# Patient Record
Sex: Female | Born: 1995 | Race: Black or African American | Hispanic: No | Marital: Single | Smoking: Never smoker
Health system: Southern US, Community
[De-identification: ages and names within clinical notes are randomized; demographics above are authoritative.]

## PROBLEM LIST (undated history)

## (undated) ENCOUNTER — Inpatient Hospital Stay (HOSPITAL_COMMUNITY): Payer: Self-pay

## (undated) DIAGNOSIS — N390 Urinary tract infection, site not specified: Secondary | ICD-10-CM

## (undated) DIAGNOSIS — B009 Herpesviral infection, unspecified: Secondary | ICD-10-CM

## (undated) DIAGNOSIS — F419 Anxiety disorder, unspecified: Secondary | ICD-10-CM

## (undated) DIAGNOSIS — Z789 Other specified health status: Secondary | ICD-10-CM

## (undated) DIAGNOSIS — L0291 Cutaneous abscess, unspecified: Secondary | ICD-10-CM

## (undated) HISTORY — PX: WISDOM TOOTH EXTRACTION: SHX21

## (undated) HISTORY — PX: HERNIA REPAIR: SHX51

---
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
## 2013-08-22 ENCOUNTER — Inpatient Hospital Stay (HOSPITAL_COMMUNITY)
Admission: AD | Admit: 2013-08-22 | Discharge: 2013-08-22 | Disposition: A | Discharge status: Home / Self Care | Payer: Medicaid Other | Source: Ambulatory Visit | Attending: Obstetrics and Gynecology | Admitting: Obstetrics and Gynecology

## 2013-08-22 ENCOUNTER — Encounter (HOSPITAL_COMMUNITY): Payer: Self-pay | Admitting: Family

## 2013-08-22 ENCOUNTER — Inpatient Hospital Stay (HOSPITAL_COMMUNITY): Payer: Medicaid Other

## 2013-08-22 DIAGNOSIS — O99891 Other specified diseases and conditions complicating pregnancy: Secondary | ICD-10-CM | POA: Diagnosis not present

## 2013-08-22 DIAGNOSIS — R109 Unspecified abdominal pain: Secondary | ICD-10-CM | POA: Insufficient documentation

## 2013-08-22 DIAGNOSIS — O9989 Other specified diseases and conditions complicating pregnancy, childbirth and the puerperium: Secondary | ICD-10-CM

## 2013-08-22 DIAGNOSIS — O26899 Other specified pregnancy related conditions, unspecified trimester: Secondary | ICD-10-CM

## 2013-08-22 HISTORY — DX: Herpesviral infection, unspecified: B00.9

## 2013-08-22 LAB — CBC
HCT: 33.6 % — ABNORMAL LOW (ref 36.0–46.0)
Hemoglobin: 10.9 g/dL — ABNORMAL LOW (ref 12.0–15.0)
MCH: 27.8 pg (ref 26.0–34.0)
MCHC: 32.4 g/dL (ref 30.0–36.0)
MCV: 85.7 fL (ref 78.0–100.0)
Platelets: 298 10*3/uL (ref 150–400)
RBC: 3.92 MIL/uL (ref 3.87–5.11)
RDW: 13.8 % (ref 11.5–15.5)
WBC: 5.5 10*3/uL (ref 4.0–10.5)

## 2013-08-22 LAB — POCT PREGNANCY, URINE: Preg Test, Ur: POSITIVE — AB

## 2013-08-22 LAB — WET PREP, GENITAL: TRICH WET PREP: NONE SEEN

## 2013-08-22 LAB — URINALYSIS, ROUTINE W REFLEX MICROSCOPIC
BILIRUBIN URINE: NEGATIVE
GLUCOSE, UA: NEGATIVE mg/dL
Hgb urine dipstick: NEGATIVE
KETONES UR: NEGATIVE mg/dL
Leukocytes, UA: NEGATIVE
Nitrite: NEGATIVE
PH: 6.5 (ref 5.0–8.0)
Protein, ur: NEGATIVE mg/dL
SPECIFIC GRAVITY, URINE: 1.02 (ref 1.005–1.030)
Urobilinogen, UA: 1 mg/dL (ref 0.0–1.0)

## 2013-08-22 LAB — HCG, QUANTITATIVE, PREGNANCY: hCG, Beta Chain, Quant, S: 3221 m[IU]/mL — ABNORMAL HIGH (ref <5)

## 2013-08-22 LAB — ABO/RH: ABO/RH(D): A POS

## 2013-08-22 MED ORDER — PRENATAL VITAMINS 28-0.8 MG PO TABS: 1.0000 | ORAL_TABLET | Freq: Every day | ORAL | Status: DC

## 2013-08-22 NOTE — MAU Note (Signed)
18 yo with +HPT on 7/3, presents with c/o lower back pain since yesterday and intermittent abdominal cramping for 2 weeks. No VB.

## 2013-08-22 NOTE — MAU Provider Note (Signed)
Attestation of Attending Supervision of Advanced Practitioner (CNM/NP): Evaluation and management procedures were performed by the Advanced Practitioner under my supervision and collaboration.  I have reviewed the Advanced Practitioner's note and chart, and I agree with the management and plan.  Maral Lampe 08/22/2013 8:24 PM

## 2013-08-22 NOTE — Discharge Instructions (Signed)

## 2013-08-22 NOTE — MAU Provider Note (Signed)
Chief Complaint: Possible Pregnancy and Abdominal Pain   None    SUBJECTIVE HPI: Amy Downs is a 18 y.o. G0P0 at Unknown by LMP who presents to maternity admissions reporting abdominal cramping, back pain, and positive HPT.  She denies vaginal bleeding, vaginal itching/burning, urinary symptoms, h/a, dizziness, n/v, or fever/chills.     Past Medical History  Diagnosis Date  . Abscess   . Medical history non-contributory   . HSV-2 infection    Past Surgical History  Procedure Laterality Date  . Wisdom tooth extraction     History   Social History  . Marital Status: Single    Spouse Name: N/A    Number of Children: N/A  . Years of Education: N/A   Occupational History  . Not on file.   Social History Main Topics  . Smoking status: Never Smoker   . Smokeless tobacco: Not on file  . Alcohol Use: No  . Drug Use: No  . Sexual Activity: Yes    Birth Control/ Protection: Condom   Other Topics Concern  . Not on file   Social History Narrative  . No narrative on file   No current facility-administered medications on file prior to encounter.   Current Outpatient Prescriptions on File Prior to Encounter  Medication Sig Dispense Refill  . ibuprofen (ADVIL,MOTRIN) 200 MG tablet Take 400 mg by mouth every 8 (eight) hours as needed for pain or headache.       Marland Kitchen. EPINEPHrine (EPIPEN) 0.3 mg/0.3 mL SOAJ Inject 0.3 mg into the muscle as needed (for allergic reaction).       No Known Allergies  ROS: Pertinent items in HPI  OBJECTIVE Blood pressure 116/73, pulse 85, temperature 98.3 F (36.8 C), temperature source Oral, resp. rate 15, last menstrual period 07/19/2013. GENERAL: Well-developed, well-nourished female in no acute distress.  HEENT: Normocephalic HEART: normal rate RESP: normal effort ABDOMEN: Soft, non-tender EXTREMITIES: Nontender, no edema NEURO: Alert and oriented Pelvic exam: Cervix pink, visually closed, without lesion, scant white creamy discharge,  vaginal walls and external genitalia normal Bimanual exam: Cervix 0/long/high, firm, anterior, neg CMT, uterus nontender, nonenlarged, adnexa without tenderness, enlargement, or mass  LAB RESULTS Results for orders placed during the hospital encounter of 08/22/13 (from the past 24 hour(s))  URINALYSIS, ROUTINE W REFLEX MICROSCOPIC     Status: None   Collection Time    08/22/13 11:45 AM      Result Value Ref Range   Color, Urine YELLOW  YELLOW   APPearance CLEAR  CLEAR   Specific Gravity, Urine 1.020  1.005 - 1.030   pH 6.5  5.0 - 8.0   Glucose, UA NEGATIVE  NEGATIVE mg/dL   Hgb urine dipstick NEGATIVE  NEGATIVE   Bilirubin Urine NEGATIVE  NEGATIVE   Ketones, ur NEGATIVE  NEGATIVE mg/dL   Protein, ur NEGATIVE  NEGATIVE mg/dL   Urobilinogen, UA 1.0  0.0 - 1.0 mg/dL   Nitrite NEGATIVE  NEGATIVE   Leukocytes, UA NEGATIVE  NEGATIVE  POCT PREGNANCY, URINE     Status: Abnormal   Collection Time    08/22/13 12:04 PM      Result Value Ref Range   Preg Test, Ur POSITIVE (*) NEGATIVE  CBC     Status: Abnormal   Collection Time    08/22/13 12:25 PM      Result Value Ref Range   WBC 5.5  4.0 - 10.5 K/uL   RBC 3.92  3.87 - 5.11 MIL/uL   Hemoglobin 10.9 (*) 12.0 -  15.0 g/dL   HCT 16.1 (*) 09.6 - 04.5 %   MCV 85.7  78.0 - 100.0 fL   MCH 27.8  26.0 - 34.0 pg   MCHC 32.4  30.0 - 36.0 g/dL   RDW 40.9  81.1 - 91.4 %   Platelets 298  150 - 400 K/uL  ABO/RH     Status: None   Collection Time    08/22/13 12:25 PM      Result Value Ref Range   ABO/RH(D) A POS    HCG, QUANTITATIVE, PREGNANCY     Status: Abnormal   Collection Time    08/22/13 12:25 PM      Result Value Ref Range   hCG, Beta Chain, Quant, S 3221 (*) <5 mIU/mL    IMAGING US Ob Comp Less 14 Wks  08/22/2013   CLINICAL DATA:  18 year old G1, LMP 07/19/2013 (4 weeks 6 days), quantitative beta HCG 3,221, presenting with low back pain since yesterday and intermittent cramping over the past 2 weeks.  EXAM: OBSTETRIC <14 WK [place] AND  TRANSVAGINAL OB US  TECHNIQUE: Both transabdominal and transvaginal ultrasound examinations were performed for complete evaluation of the gestation as well as the maternal uterus, adnexal regions, and pelvic cul-de-sac. Transvaginal technique was performed to assess early pregnancy.  COMPARISON:  None.  FINDINGS: Intrauterine gestational sac: 2 intrauterine gestational sacs, the more inferior of which is designated A and the more superior of which is designated B. Both have normal oval shapes and normal decidual reactions.  Yolk sac:  Not yet identified in either sac.  Embryo:  Not yet identified in either sac.  Cardiac Activity: Not applicable.  MSD (sac A, inferior):  4.3 mm   5 w   0 d  MSD (sac B, superior): 3.5 mm   4 w   6 d  [place] EDC: 04/25/2014  Maternal uterus/adnexae: No evidence of subchorionic hemorrhage. Normal-appearing closed cervix. Right ovary normal in size and appearance measuring approximately 2.6 x 2.0 x 2.1 cm. Left ovary normal in size and appearance measuring approximately 2.2 x 1.6 x 1.9 cm, containing a small corpus luteum cyst. Small amount of free fluid in the cul-de-sac. No adnexal masses.  IMPRESSION: 1. Two separate intrauterine gestational sacs, each of which appear to have normal decidual reactions. No yolk sac, fetal pole, or cardiac activity yet visualized. Estimated gestational age by mean sac diameter 4 weeks 6 days, corresponding exactly with the LMP. Recommend follow-up quantitative B-HCG levels and follow-up US in 14 days to confirm and assess viability. This recommendation follows SRU consensus guidelines: Diagnostic Criteria for Nonviable Pregnancy Early in the First Trimester. Malva Limes Med 2013; 782:9562-13. 2. Small amount of likely physiologic free fluid in the cul-de-sac. 3. No adnexal masses.   Electronically Signed   By: Hulan Saas M.D.   On: 08/22/2013 13:57   US Ob Transvaginal  08/22/2013   CLINICAL DATA:  18 year old G1, LMP 07/19/2013 (4 weeks 6 days),  quantitative beta HCG 3,221, presenting with low back pain since yesterday and intermittent cramping over the past 2 weeks.  EXAM: OBSTETRIC <14 WK [place] AND TRANSVAGINAL OB US  TECHNIQUE: Both transabdominal and transvaginal ultrasound examinations were performed for complete evaluation of the gestation as well as the maternal uterus, adnexal regions, and pelvic cul-de-sac. Transvaginal technique was performed to assess early pregnancy.  COMPARISON:  None.  FINDINGS: Intrauterine gestational sac: 2 intrauterine gestational sacs, the more inferior of which is designated A and the more superior of which  is designated B. Both have normal oval shapes and normal decidual reactions.  Yolk sac:  Not yet identified in either sac.  Embryo:  Not yet identified in either sac.  Cardiac Activity: Not applicable.  MSD (sac A, inferior):  4.3 mm   5 w   0 d  MSD (sac B, superior): 3.5 mm   4 w   6 d  US EDC: 04/25/2014  Maternal uterus/adnexae: No evidence of subchorionic hemorrhage. Normal-appearing closed cervix. Right ovary normal in size and appearance measuring approximately 2.6 x 2.0 x 2.1 cm. Left ovary normal in size and appearance measuring approximately 2.2 x 1.6 x 1.9 cm, containing a small corpus luteum cyst. Small amount of free fluid in the cul-de-sac. No adnexal masses.  IMPRESSION: 1. Two separate intrauterine gestational sacs, each of which appear to have normal decidual reactions. No yolk sac, fetal pole, or cardiac activity yet visualized. Estimated gestational age by mean sac diameter 4 weeks 6 days, corresponding exactly with the LMP. Recommend follow-up quantitative B-HCG levels and follow-up US in 14 days to confirm and assess viability. This recommendation follows SRU consensus guidelines: Diagnostic Criteria for Nonviable Pregnancy Early in the First Trimester. Malva Limes Engl J Med 2013; 914:7829-56; 369:1443-51. 2. Small amount of likely physiologic free fluid in the cul-de-sac. 3. No adnexal masses.   Electronically Signed    By: Hulan Saashomas  Lawrence M.D.   On: 08/22/2013 13:57    ASSESSMENT 1. Abdominal pain affecting pregnancy, antepartum     PLAN Discharge home with ectopic precautions Return to MAU in 48 hours for repeat quant hcg Return sooner as needed for emergencies    Medication List    STOP taking these medications       ibuprofen 200 MG tablet  Commonly known as:  ADVIL,MOTRIN      TAKE these medications       EPIPEN 0.3 mg/0.3 mL Soaj injection  Generic drug:  EPINEPHrine  Inject 0.3 mg into the muscle as needed (for allergic reaction).     Prenatal Vitamins 28-0.8 MG Tabs  Take 1 tablet by mouth daily.     valACYclovir 1000 MG tablet  Commonly known as:  VALTREX  Take 500 mg by mouth 2 (two) times daily as needed (outbreaks).         Sharen CounterLisa Leftwich-Kirby Certified Nurse-Midwife 08/22/2013  2:25 PM

## 2013-08-23 LAB — GC/CHLAMYDIA PROBE AMP
CT PROBE, AMP APTIMA: NEGATIVE
GC Probe RNA: NEGATIVE

## 2013-08-24 ENCOUNTER — Inpatient Hospital Stay (HOSPITAL_COMMUNITY)
Admission: AD | Admit: 2013-08-24 | Discharge: 2013-08-24 | Disposition: A | Discharge status: Home / Self Care | Payer: Medicaid Other | Source: Ambulatory Visit | Attending: Obstetrics & Gynecology | Admitting: Obstetrics & Gynecology

## 2013-08-24 DIAGNOSIS — O99891 Other specified diseases and conditions complicating pregnancy: Secondary | ICD-10-CM | POA: Insufficient documentation

## 2013-08-24 DIAGNOSIS — R109 Unspecified abdominal pain: Secondary | ICD-10-CM

## 2013-08-24 DIAGNOSIS — Z09 Encounter for follow-up examination after completed treatment for conditions other than malignant neoplasm: Secondary | ICD-10-CM | POA: Diagnosis not present

## 2013-08-24 DIAGNOSIS — O9989 Other specified diseases and conditions complicating pregnancy, childbirth and the puerperium: Secondary | ICD-10-CM

## 2013-08-24 DIAGNOSIS — O26899 Other specified pregnancy related conditions, unspecified trimester: Secondary | ICD-10-CM

## 2013-08-24 LAB — HCG, QUANTITATIVE, PREGNANCY: hCG, Beta Chain, Quant, S: 8495 m[IU]/mL — ABNORMAL HIGH (ref <5)

## 2013-08-24 NOTE — MAU Note (Signed)
Feeling ok. occ cramping. No bleeding.

## 2013-08-24 NOTE — Discharge Instructions (Signed)

## 2013-08-24 NOTE — MAU Provider Note (Signed)
  History     CSN: [id number]  Arrival date and time: 08/24/13 1441   None     Chief Complaint  Patient presents with  . Follow-up   HPI  Pt is a 18 yo G1P0 at 5614w1d wks IUP presenting to MAU for repeat BHCG.  Seen in MAU on 08/22/13 for abdominal pain and positive pregnancy test.  Pt blood type is Apos and BHCG on that visit was 3221.  Ultrasound showed the following results:  IMPRESSION: 1. Two separate intrauterine gestational sacs, each of which appear to have normal decidual reactions. No yolk sac, fetal pole, or cardiac activity yet visualized. Estimated gestational age by mean sac diameter 4 weeks 6 days, corresponding exactly with the LMP.   Pt denies vaginal bleeding or pain.    Past Medical History  Diagnosis Date  . Abscess   . Medical history non-contributory   . HSV-2 infection     Past Surgical History  Procedure Laterality Date  . Wisdom tooth extraction      No family history on file.  History  Substance Use Topics  . Smoking status: Never Smoker   . Smokeless tobacco: Not on file  . Alcohol Use: No    Allergies: No Known Allergies  Prescriptions prior to admission  Medication Sig Dispense Refill  . EPINEPHrine (EPIPEN) 0.3 mg/0.3 mL SOAJ Inject 0.3 mg into the muscle as needed (for allergic reaction).      . Prenatal Vit-Fe Fumarate-FA (PRENATAL VITAMINS) 28-0.8 MG TABS Take 1 tablet by mouth daily.  30 tablet  5  . valACYclovir (VALTREX) 1000 MG tablet Take 500 mg by mouth 2 (two) times daily as needed (outbreaks).        Review of Systems  Gastrointestinal: Negative for abdominal pain.  Genitourinary:       Negative vaginal bleeding   Pertinent info found in HPI Physical Exam   Blood pressure 123/62, pulse 90, temperature 98.2 F (36.8 C), temperature source Oral, resp. rate 16, last menstrual period 07/19/2013.  Physical Exam  Constitutional: She is oriented to person, place, and time. She appears well-developed and well-nourished. No  distress.  HENT:  Head: Normocephalic.  Neck: Normal range of motion. Neck supple.  GI: Soft. There is no tenderness.  Neurological: She is alert and oriented to person, place, and time. She has normal reflexes.  Skin: Skin is warm and dry.    MAU Course  Procedures  Results for orders placed during the hospital encounter of 08/24/13 (from the past 24 hour(s))  HCG, QUANTITATIVE, PREGNANCY     Status: Abnormal   Collection Time    08/24/13  2:49 PM      Result Value Ref Range   hCG, Beta Chain, Quant, S 8495 (*) <5 mIU/mL    Assessment and Plan  18 yo G1P0 at 3014w1d wks IUP - Appropriate BHCG Rise  Plan: Discharge to home Repeat ultrasound in 10 days for viability - scheduled for July 17th at 2:45PM Reviewed bleeding/ectopic precautions  [email or web address],WALIDAH 08/24/2013, 3:40 PM

## 2013-08-26 NOTE — MAU Provider Note (Signed)
Attestation of Attending Supervision of Advanced Practitioner (CNM/NP): Evaluation and management procedures were performed by the Advanced Practitioner under my supervision and collaboration. I have reviewed the Advanced Practitioner's note and chart, and I agree with the management and plan.  Keyasia Jolliff H. 9:06 AM   

## 2013-08-29 ENCOUNTER — Inpatient Hospital Stay (HOSPITAL_COMMUNITY)
Admission: AD | Admit: 2013-08-29 | Discharge: 2013-08-29 | Discharge status: Against Medical Advice | Payer: Medicaid Other | Source: Ambulatory Visit | Attending: Obstetrics & Gynecology | Admitting: Obstetrics & Gynecology

## 2013-08-29 DIAGNOSIS — O210 Mild hyperemesis gravidarum: Secondary | ICD-10-CM | POA: Diagnosis not present

## 2013-08-29 DIAGNOSIS — R109 Unspecified abdominal pain: Secondary | ICD-10-CM | POA: Diagnosis not present

## 2013-08-29 LAB — URINE MICROSCOPIC-ADD ON

## 2013-08-29 LAB — URINALYSIS, ROUTINE W REFLEX MICROSCOPIC
Bilirubin Urine: NEGATIVE
GLUCOSE, UA: NEGATIVE mg/dL
HGB URINE DIPSTICK: NEGATIVE
KETONES UR: 15 mg/dL — AB
Nitrite: NEGATIVE
PROTEIN: NEGATIVE mg/dL
Specific Gravity, Urine: 1.02 (ref 1.005–1.030)
Urobilinogen, UA: 1 mg/dL (ref 0.0–1.0)
pH: 6 (ref 5.0–8.0)

## 2013-08-29 NOTE — MAU Note (Signed)
Prenatal pills make her throw up. Threw up 5 times today, no food- just bile.  Pain in upper stomach, started yesterday- comes and goes, none now.  Pain below ribs on left side, also comes and goes- none now; worse when lays down.

## 2013-09-03 ENCOUNTER — Ambulatory Visit (HOSPITAL_COMMUNITY): Payer: Medicaid Other

## 2013-12-20 ENCOUNTER — Encounter (HOSPITAL_COMMUNITY): Payer: Self-pay | Admitting: Family

## 2014-01-24 ENCOUNTER — Encounter (HOSPITAL_COMMUNITY): Payer: Self-pay | Admitting: *Deleted

## 2014-01-24 ENCOUNTER — Inpatient Hospital Stay (HOSPITAL_COMMUNITY)
Admission: AD | Admit: 2014-01-24 | Discharge: 2014-01-24 | Disposition: A | Discharge status: Home / Self Care | Payer: Medicaid Other | Source: Ambulatory Visit | Attending: Family Medicine | Admitting: Family Medicine

## 2014-01-24 ENCOUNTER — Inpatient Hospital Stay (HOSPITAL_COMMUNITY): Payer: Medicaid Other

## 2014-01-24 DIAGNOSIS — Z3A27 27 weeks gestation of pregnancy: Secondary | ICD-10-CM | POA: Diagnosis not present

## 2014-01-24 DIAGNOSIS — O99012 Anemia complicating pregnancy, second trimester: Secondary | ICD-10-CM | POA: Diagnosis not present

## 2014-01-24 DIAGNOSIS — O368132 Decreased fetal movements, third trimester, fetus 2: Secondary | ICD-10-CM

## 2014-01-24 DIAGNOSIS — D509 Iron deficiency anemia, unspecified: Secondary | ICD-10-CM | POA: Diagnosis not present

## 2014-01-24 DIAGNOSIS — O368120 Decreased fetal movements, second trimester, not applicable or unspecified: Secondary | ICD-10-CM | POA: Diagnosis not present

## 2014-01-24 DIAGNOSIS — R109 Unspecified abdominal pain: Secondary | ICD-10-CM | POA: Diagnosis present

## 2014-01-24 DIAGNOSIS — O30002 Twin pregnancy, unspecified number of placenta and unspecified number of amniotic sacs, second trimester: Secondary | ICD-10-CM | POA: Insufficient documentation

## 2014-01-24 DIAGNOSIS — O368190 Decreased fetal movements, unspecified trimester, not applicable or unspecified: Secondary | ICD-10-CM | POA: Insufficient documentation

## 2014-01-24 LAB — URINALYSIS, ROUTINE W REFLEX MICROSCOPIC
Bilirubin Urine: NEGATIVE
Glucose, UA: NEGATIVE mg/dL
Hgb urine dipstick: NEGATIVE
KETONES UR: 15 mg/dL — AB
Leukocytes, UA: NEGATIVE
NITRITE: NEGATIVE
PH: 5.5 (ref 5.0–8.0)
Protein, ur: NEGATIVE mg/dL
Urobilinogen, UA: 1 mg/dL (ref 0.0–1.0)

## 2014-01-24 NOTE — Progress Notes (Signed)
MD informed pt back from U/S, further EFM not needed @ this time.

## 2014-01-24 NOTE — MAU Provider Note (Signed)
History     CSN: [id number]  Arrival date and time: 01/24/14 1351    Chief Complaint  Patient presents with  . Abdominal Pain   HPI  Amy Downs is a 18 y.o. G1P0 at 7851w0d currently pregnancy with twins who presents to the MAU for evaluation of decreased fetal movement starting earlier today. Yesterday was feeling normal fetal movement. Today has felt the twins move, but not like normal. Denies uterine contractions, loss of fluid, vaginal bleeding. Otherwise feels well.   Pregnancy c/b: - Obt[place] care from [facility] [place], but [place] in [place] Twins - Iron Deficiency Anemia on ferrous sulfate - HSV   Past Medical History  Diagnosis Date  . Abscess   . Medical history non-contributory   . HSV-2 infection     Past Surgical History  Procedure Laterality Date  . Wisdom tooth extraction      History reviewed. No pertinent family history.  History  Substance Use Topics  . Smoking status: Never Smoker   . Smokeless tobacco: Not on file  . Alcohol Use: No    Allergies: No Known Allergies  Prescriptions prior to admission  Medication Sig Dispense Refill Last Dose  . Ferrous Sulfate (SLOW FE PO) Take 1 tablet by mouth daily.   01/24/2014 at Unknown time  . Prenatal Vit-Fe Fumarate-FA (PRENATAL VITAMINS) 28-0.8 MG TABS Take 1 tablet by mouth daily. 30 tablet 5 01/23/2014 at Unknown time  . valACYclovir (VALTREX) 1000 MG tablet Take 1,000 mg by mouth daily.    01/23/2014 at Unknown time  . EPINEPHrine (EPIPEN) 0.3 mg/0.3 mL SOAJ Inject 0.3 mg into the muscle as needed (for allergic reaction).   Emergency    Review of Systems  Constitutional: Negative.  Negative for fever, chills and malaise/fatigue.  HENT: Negative.  Negative for congestion and sore throat.   Eyes: Negative.  Negative for double vision and photophobia.  Respiratory: Negative.  Negative for cough, shortness of breath and wheezing.   Cardiovascular: Negative.  Negative for chest pain and leg  swelling.  Gastrointestinal: Negative.  Negative for nausea, vomiting, abdominal pain, diarrhea and constipation.  Genitourinary: Negative.  Negative for dysuria, urgency, frequency and hematuria.  Musculoskeletal: Negative.  Negative for myalgias.  Skin: Negative.   Neurological: Negative.  Negative for weakness and headaches.  Psychiatric/Behavioral: Negative.   All other systems reviewed and are negative.  Physical Exam   Blood pressure 118/71, pulse 107, temperature 98.1 F (36.7 C), temperature source Oral, resp. rate 20, last menstrual period 07/19/2013.  Physical Exam  Nursing note and vitals reviewed. Constitutional: She is oriented to person, place, and time. She appears well-developed and well-nourished. No distress.  HENT:  Head: Normocephalic and atraumatic.  Cardiovascular: Normal rate.   Respiratory: Effort normal.  GI: Soft. There is no tenderness.  Neurological: She is alert and oriented to person, place, and time.  Skin: Skin is warm and dry.  Psychiatric: She has a normal mood and affect. Her behavior is normal.    MAU Course  Procedures  MDM NST reactive x 2; 145/moderate/+A/+rare variable consistent with gestational age Ultrasound: BPP 8/8 x 2 with normal fluid levels  Assessment and Plan   Amy Downs is a 18 y.o. G1P0 at 2351w0d with twin gestation here for evaluation of decreased fetal movement. Fetal testing reassuring in triage today (including NST reactive for gestational age and 8/8 BPP x 2)  - Not in labor.  - FM/PTL precautions reviewed.  - Reviewed kick counts.  -  Follow up with OB provider in 1 week for routine visit.   William DaltonMcEachern, Genowefa Morga 01/24/2014, 5:16 PM

## 2014-01-24 NOTE — MAU Note (Signed)
Pt states lower abd pain started yesterday, today has sharp pain on both sides & pelvic pressure.  Denies bleeding or LOF.  Decreased FM since last night.

## 2014-01-24 NOTE — Discharge Instructions (Signed)
[facility]. You had an ultrasound that showed normal movement for both your babies and normal fluid levels. You should follow up with your primary OB physician in 1 week. In the meantime, if you develop regular contractions, loss of fluid, vaginal bleeding, decreased fetal movement or other concerning symptoms please seek care sooner.    Fetal Movement Counts Patient Name: __________________________________________________ Patient Due Date: ____________________ Performing a fetal movement count is highly recommended in high-risk pregnancies, but it is good for every pregnant woman to do. Your health care provider may ask you to start counting fetal movements at 28 weeks of the pregnancy. Fetal movements often increase:  After eating a full meal.  After physical activity.  After eating or drinking something sweet or cold.  At rest. Pay attention to when you feel the baby is most active. This will help you notice a pattern of your baby's sleep and wake cycles and what factors contribute to an increase in fetal movement. It is important to perform a fetal movement count at the same time each day when your baby is normally most active.  HOW TO COUNT FETAL MOVEMENTS 1. Find a quiet and comfortable area to sit or lie down on your left side. Lying on your left side provides the best blood and oxygen circulation to your baby. 2. Write down the day and time on a sheet of paper or in a journal. 3. Start counting kicks, flutters, swishes, rolls, or jabs in a 2-hour period. You should feel at least 10 movements within 2 hours. 4. If you do not feel 10 movements in 2 hours, wait 2-3 hours and count again. Look for a change in the pattern or not enough counts in 2 hours. SEEK MEDICAL CARE IF:  You feel less than 10 counts in 2 hours, tried twice.  There is no movement in over an hour.  The pattern is changing or taking longer each day to reach 10 counts  in 2 hours.  You feel the baby is not moving as he or she usually does. Date: ____________ Movements: ____________ Start time: ____________ Doreatha MartinFinish time: ____________  Date: ____________ Movements: ____________ Start time: ____________ Doreatha MartinFinish time: ____________ Date: ____________ Movements: ____________ Start time: ____________ Doreatha MartinFinish time: ____________ Date: ____________ Movements: ____________ Start time: ____________ Doreatha MartinFinish time: ____________ Date: ____________ Movements: ____________ Start time: ____________ Doreatha MartinFinish time: ____________ Date: ____________ Movements: ____________ Start time: ____________ Doreatha MartinFinish time: ____________ Date: ____________ Movements: ____________ Start time: ____________ Doreatha MartinFinish time: ____________ Date: ____________ Movements: ____________ Start time: ____________ Doreatha MartinFinish time: ____________  Date: ____________ Movements: ____________ Start time: ____________ Doreatha MartinFinish time: ____________ Date: ____________ Movements: ____________ Start time: ____________ Doreatha MartinFinish time: ____________ Date: ____________ Movements: ____________ Start time: ____________ Doreatha MartinFinish time: ____________ Date: ____________ Movements: ____________ Start time: ____________ Doreatha MartinFinish time: ____________ Date: ____________ Movements: ____________ Start time: ____________ Doreatha MartinFinish time: ____________ Date: ____________ Movements: ____________ Start time: ____________ Doreatha MartinFinish time: ____________ Date: ____________ Movements: ____________ Start time: ____________ Doreatha MartinFinish time: ____________  Date: ____________ Movements: ____________ Start time: ____________ Doreatha MartinFinish time: ____________ Date: ____________ Movements: ____________ Start time: ____________ Doreatha MartinFinish time: ____________ Date: ____________ Movements: ____________ Start time: ____________ Doreatha MartinFinish time: ____________ Date: ____________ Movements: ____________ Start time: ____________ Doreatha MartinFinish time: ____________ Date: ____________ Movements: ____________ Start time:  ____________ Doreatha MartinFinish time: ____________ Date: ____________ Movements: ____________ Start time: ____________ Doreatha MartinFinish time: ____________ Date: ____________ Movements: ____________ Start time: ____________ Doreatha MartinFinish time: ____________  Date: ____________ Movements: ____________ Start time: ____________ Doreatha MartinFinish time: ____________ Date: ____________ Movements: ____________  Start time: ____________ Doreatha MartinFinish time: ____________ Date: ____________ Movements: ____________ Start time: ____________ Doreatha MartinFinish time: ____________ Date: ____________ Movements: ____________ Start time: ____________ Doreatha MartinFinish time: ____________ Date: ____________ Movements: ____________ Start time: ____________ Doreatha MartinFinish time: ____________ Date: ____________ Movements: ____________ Start time: ____________ Doreatha MartinFinish time: ____________ Date: ____________ Movements: ____________ Start time: ____________ Doreatha MartinFinish time: ____________  Date: ____________ Movements: ____________ Start time: ____________ Doreatha MartinFinish time: ____________ Date: ____________ Movements: ____________ Start time: ____________ Doreatha MartinFinish time: ____________ Date: ____________ Movements: ____________ Start time: ____________ Doreatha MartinFinish time: ____________ Date: ____________ Movements: ____________ Start time: ____________ Doreatha MartinFinish time: ____________ Date: ____________ Movements: ____________ Start time: ____________ Doreatha MartinFinish time: ____________ Date: ____________ Movements: ____________ Start time: ____________ Doreatha MartinFinish time: ____________ Date: ____________ Movements: ____________ Start time: ____________ Doreatha MartinFinish time: ____________  Date: ____________ Movements: ____________ Start time: ____________ Doreatha MartinFinish time: ____________ Date: ____________ Movements: ____________ Start time: ____________ Doreatha MartinFinish time: ____________ Date: ____________ Movements: ____________ Start time: ____________ Doreatha MartinFinish time: ____________ Date: ____________ Movements: ____________ Start time: ____________ Doreatha MartinFinish time: ____________ Date:  ____________ Movements: ____________ Start time: ____________ Doreatha MartinFinish time: ____________ Date: ____________ Movements: ____________ Start time: ____________ Doreatha MartinFinish time: ____________ Date: ____________ Movements: ____________ Start time: ____________ Doreatha MartinFinish time: ____________  Date: ____________ Movements: ____________ Start time: ____________ Doreatha MartinFinish time: ____________ Date: ____________ Movements: ____________ Start time: ____________ Doreatha MartinFinish time: ____________ Date: ____________ Movements: ____________ Start time: ____________ Doreatha MartinFinish time: ____________ Date: ____________ Movements: ____________ Start time: ____________ Doreatha MartinFinish time: ____________ Date: ____________ Movements: ____________ Start time: ____________ Doreatha MartinFinish time: ____________ Date: ____________ Movements: ____________ Start time: ____________ Doreatha MartinFinish time: ____________ Date: ____________ Movements: ____________ Start time: ____________ Doreatha MartinFinish time: ____________  Date: ____________ Movements: ____________ Start time: ____________ Doreatha MartinFinish time: ____________ Date: ____________ Movements: ____________ Start time: ____________ Doreatha MartinFinish time: ____________ Date: ____________ Movements: ____________ Start time: ____________ Doreatha MartinFinish time: ____________ Date: ____________ Movements: ____________ Start time: ____________ Doreatha MartinFinish time: ____________ Date: ____________ Movements: ____________ Start time: ____________ Doreatha MartinFinish time: ____________ Date: ____________ Movements: ____________ Start time: ____________ Doreatha MartinFinish time: ____________ Document Released: 03/06/2006 Document Revised: 06/21/2013 Document Reviewed: 12/02/2011 [employer] Patient Information 2015 [employer]. This information is not intended to replace advice given to you by your health care provider. Make sure you discuss any questions you have with your health care provider.

## 2014-01-24 NOTE — MAU Note (Signed)
[facility] [place]

## 2014-01-25 DIAGNOSIS — O368190 Decreased fetal movements, unspecified trimester, not applicable or unspecified: Secondary | ICD-10-CM | POA: Insufficient documentation

## 2014-02-07 ENCOUNTER — Encounter (HOSPITAL_COMMUNITY): Payer: Self-pay | Admitting: General Practice

## 2014-02-07 ENCOUNTER — Inpatient Hospital Stay (HOSPITAL_COMMUNITY)
Admission: AD | Admit: 2014-02-07 | Discharge: 2014-02-07 | Disposition: A | Discharge status: Home / Self Care | Payer: Medicaid Other | Source: Ambulatory Visit | Attending: Obstetrics & Gynecology | Admitting: Obstetrics & Gynecology

## 2014-02-07 DIAGNOSIS — R109 Unspecified abdominal pain: Secondary | ICD-10-CM | POA: Diagnosis not present

## 2014-02-07 DIAGNOSIS — Z042 Encounter for examination and observation following work accident: Secondary | ICD-10-CM | POA: Diagnosis present

## 2014-02-07 DIAGNOSIS — O30043 Twin pregnancy, dichorionic/diamniotic, third trimester: Secondary | ICD-10-CM | POA: Diagnosis not present

## 2014-02-07 DIAGNOSIS — W1839XA Other fall on same level, initial encounter: Secondary | ICD-10-CM | POA: Diagnosis not present

## 2014-02-07 DIAGNOSIS — O9A212 Injury, poisoning and certain other consequences of external causes complicating pregnancy, second trimester: Secondary | ICD-10-CM

## 2014-02-07 LAB — CBC
HCT: 31.2 % — ABNORMAL LOW (ref 36.0–46.0)
HEMOGLOBIN: 10.3 g/dL — AB (ref 12.0–15.0)
MCH: 29 pg (ref 26.0–34.0)
MCHC: 33 g/dL (ref 30.0–36.0)
MCV: 87.9 fL (ref 78.0–100.0)
Platelets: 226 10*3/uL (ref 150–400)
RBC: 3.55 MIL/uL — AB (ref 3.87–5.11)
RDW: 14.8 % (ref 11.5–15.5)
WBC: 7 10*3/uL (ref 4.0–10.5)

## 2014-02-07 LAB — TYPE AND SCREEN
ABO/RH(D): A POS
ANTIBODY SCREEN: NEGATIVE

## 2014-02-07 NOTE — MAU Note (Signed)
Urine in lab 

## 2014-02-07 NOTE — MAU Note (Signed)
Twin pregnancy

## 2014-02-07 NOTE — Discharge Instructions (Signed)
[facility]. You were monitored for several hours and you did not have contractions, vaginal bleeding or other concerning symptoms. You should follow up with your OB provider within one week for re-evaluation. If you develop decreased fetal movement, vaginal bleeding, contractions, loss of fluid or other concerning symptoms you should be evaluated sooner.   Fetal Movement Counts Patient Name: __________________________________________________ Patient Due Date: ____________________ Performing a fetal movement count is highly recommended in high-risk pregnancies, but it is good for every pregnant woman to do. Your health care provider may ask you to start counting fetal movements at 28 weeks of the pregnancy. Fetal movements often increase:  After eating a full meal.  After physical activity.  After eating or drinking something sweet or cold.  At rest. Pay attention to when you feel the baby is most active. This will help you notice a pattern of your baby's sleep and wake cycles and what factors contribute to an increase in fetal movement. It is important to perform a fetal movement count at the same time each day when your baby is normally most active.  HOW TO COUNT FETAL MOVEMENTS  Find a quiet and comfortable area to sit or lie down on your left side. Lying on your left side provides the best blood and oxygen circulation to your baby.  Write down the day and time on a sheet of paper or in a journal.  Start counting kicks, flutters, swishes, rolls, or jabs in a 2-hour period. You should feel at least 10 movements within 2 hours.  If you do not feel 10 movements in 2 hours, wait 2-3 hours and count again. Look for a change in the pattern or not enough counts in 2 hours. SEEK MEDICAL CARE IF:  You feel less than 10 counts in 2 hours, tried twice.  There is no movement in over an hour.  The pattern is changing or taking longer each day to  reach 10 counts in 2 hours.  You feel the baby is not moving as he or she usually does. Date: ____________ Movements: ____________ Start time: ____________ Doreatha MartinFinish time: ____________  Date: ____________ Movements: ____________ Start time: ____________ Doreatha MartinFinish time: ____________ Date: ____________ Movements: ____________ Start time: ____________ Doreatha MartinFinish time: ____________ Date: ____________ Movements: ____________ Start time: ____________ Doreatha MartinFinish time: ____________ Date: ____________ Movements: ____________ Start time: ____________ Doreatha MartinFinish time: ____________ Date: ____________ Movements: ____________ Start time: ____________ Doreatha MartinFinish time: ____________ Date: ____________ Movements: ____________ Start time: ____________ Doreatha MartinFinish time: ____________ Date: ____________ Movements: ____________ Start time: ____________ Doreatha MartinFinish time: ____________  Date: ____________ Movements: ____________ Start time: ____________ Doreatha MartinFinish time: ____________ Date: ____________ Movements: ____________ Start time: ____________ Doreatha MartinFinish time: ____________ Date: ____________ Movements: ____________ Start time: ____________ Doreatha MartinFinish time: ____________ Date: ____________ Movements: ____________ Start time: ____________ Doreatha MartinFinish time: ____________ Date: ____________ Movements: ____________ Start time: ____________ Doreatha MartinFinish time: ____________ Date: ____________ Movements: ____________ Start time: ____________ Doreatha MartinFinish time: ____________ Date: ____________ Movements: ____________ Start time: ____________ Doreatha MartinFinish time: ____________  Date: ____________ Movements: ____________ Start time: ____________ Doreatha MartinFinish time: ____________ Date: ____________ Movements: ____________ Start time: ____________ Doreatha MartinFinish time: ____________ Date: ____________ Movements: ____________ Start time: ____________ Doreatha MartinFinish time: ____________ Date: ____________ Movements: ____________ Start time: ____________ Doreatha MartinFinish time: ____________ Date: ____________ Movements: ____________ Start  time: ____________ Doreatha MartinFinish time: ____________ Date: ____________ Movements: ____________ Start time: ____________ Doreatha MartinFinish time: ____________ Date: ____________ Movements: ____________ Start time: ____________ Doreatha MartinFinish time: ____________  Date: ____________ Movements: ____________ Start time: ____________ Doreatha MartinFinish time: ____________ Date: ____________ Movements: ____________  Start time: ____________ Doreatha MartinFinish time: ____________ Date: ____________ Movements: ____________ Start time: ____________ Doreatha MartinFinish time: ____________ Date: ____________ Movements: ____________ Start time: ____________ Doreatha MartinFinish time: ____________ Date: ____________ Movements: ____________ Start time: ____________ Doreatha MartinFinish time: ____________ Date: ____________ Movements: ____________ Start time: ____________ Doreatha MartinFinish time: ____________ Date: ____________ Movements: ____________ Start time: ____________ Doreatha MartinFinish time: ____________  Date: ____________ Movements: ____________ Start time: ____________ Doreatha MartinFinish time: ____________ Date: ____________ Movements: ____________ Start time: ____________ Doreatha MartinFinish time: ____________ Date: ____________ Movements: ____________ Start time: ____________ Doreatha MartinFinish time: ____________ Date: ____________ Movements: ____________ Start time: ____________ Doreatha MartinFinish time: ____________ Date: ____________ Movements: ____________ Start time: ____________ Doreatha MartinFinish time: ____________ Date: ____________ Movements: ____________ Start time: ____________ Doreatha MartinFinish time: ____________ Date: ____________ Movements: ____________ Start time: ____________ Doreatha MartinFinish time: ____________  Date: ____________ Movements: ____________ Start time: ____________ Doreatha MartinFinish time: ____________ Date: ____________ Movements: ____________ Start time: ____________ Doreatha MartinFinish time: ____________ Date: ____________ Movements: ____________ Start time: ____________ Doreatha MartinFinish time: ____________ Date: ____________ Movements: ____________ Start time: ____________ Doreatha MartinFinish time:  ____________ Date: ____________ Movements: ____________ Start time: ____________ Doreatha MartinFinish time: ____________ Date: ____________ Movements: ____________ Start time: ____________ Doreatha MartinFinish time: ____________ Date: ____________ Movements: ____________ Start time: ____________ Doreatha MartinFinish time: ____________  Date: ____________ Movements: ____________ Start time: ____________ Doreatha MartinFinish time: ____________ Date: ____________ Movements: ____________ Start time: ____________ Doreatha MartinFinish time: ____________ Date: ____________ Movements: ____________ Start time: ____________ Doreatha MartinFinish time: ____________ Date: ____________ Movements: ____________ Start time: ____________ Doreatha MartinFinish time: ____________ Date: ____________ Movements: ____________ Start time: ____________ Doreatha MartinFinish time: ____________ Date: ____________ Movements: ____________ Start time: ____________ Doreatha MartinFinish time: ____________ Date: ____________ Movements: ____________ Start time: ____________ Doreatha MartinFinish time: ____________  Date: ____________ Movements: ____________ Start time: ____________ Doreatha MartinFinish time: ____________ Date: ____________ Movements: ____________ Start time: ____________ Doreatha MartinFinish time: ____________ Date: ____________ Movements: ____________ Start time: ____________ Doreatha MartinFinish time: ____________ Date: ____________ Movements: ____________ Start time: ____________ Doreatha MartinFinish time: ____________ Date: ____________ Movements: ____________ Start time: ____________ Doreatha MartinFinish time: ____________ Date: ____________ Movements: ____________ Start time: ____________ Doreatha MartinFinish time: ____________ Document Released: 03/06/2006 Document Revised: 06/21/2013 Document Reviewed: 12/02/2011 [employer] Patient Information 2015 [employer]. This information is not intended to replace advice given to you by your health care provider. Make sure you discuss any questions you have with your health care provider. Braxton Hicks Contractions Contractions of the uterus can occur throughout pregnancy. Contractions  are not always a sign that you are in labor.  WHAT ARE BRAXTON HICKS CONTRACTIONS?  Contractions that occur before labor are called Braxton Hicks contractions, or false labor. Toward the end of pregnancy (32-34 weeks), these contractions can develop more often and may become more forceful. This is not true labor because these contractions do not result in opening (dilatation) and thinning of the cervix. They are sometimes difficult to tell apart from true labor because these contractions can be forceful and people have different pain tolerances. You should not feel embarrassed if you go to the hospital with false labor. Sometimes, the only way to tell if you are in true labor is for your health care provider to look for changes in the cervix. If there are no prenatal problems or other health problems associated with the pregnancy, it is completely safe to be sent home with false labor and await the onset of true labor. HOW CAN YOU TELL THE DIFFERENCE BETWEEN TRUE AND FALSE LABOR? False Labor  The contractions of false labor are usually shorter and not as hard as those of true labor.   The contractions are usually irregular.   The contractions are often felt in the front of the lower abdomen  and in the groin.   The contractions may go away when you walk around or change positions while lying down.   The contractions get weaker and are shorter lasting as time goes on.   The contractions do not usually become progressively stronger, regular, and closer together as with true labor.  True Labor  Contractions in true labor last 30-70 seconds, become very regular, usually become more intense, and increase in frequency.   The contractions do not go away with walking.   The discomfort is usually felt in the top of the uterus and spreads to the lower abdomen and low back.   True labor can be determined by your health care provider with an exam. This will show that the cervix is dilating and  getting thinner.  WHAT TO REMEMBER  Keep up with your usual exercises and follow other instructions given by your health care provider.   Take medicines as directed by your health care provider.   Keep your regular prenatal appointments.   Eat and drink lightly if you think you are going into labor.   If Braxton Hicks contractions are making you uncomfortable:   Change your position from lying down or resting to walking, or from walking to resting.   Sit and rest in a tub of warm water.   Drink 2-3 glasses of water. Dehydration may cause these contractions.   Do slow and deep breathing several times an hour.  WHEN SHOULD I SEEK IMMEDIATE MEDICAL CARE? Seek immediate medical care if:  Your contractions become stronger, more regular, and closer together.   You have fluid leaking or gushing from your vagina.   You have a fever.   You pass blood-tinged mucus.   You have vaginal bleeding.   You have continuous abdominal pain.   You have low back pain that you never had before.   You feel your baby's head pushing down and causing pelvic pressure.   Your baby is not moving as much as it used to.  Document Released: 02/04/2005 Document Revised: 02/09/2013 Document Reviewed: 11/16/2012 [facility] Patient Information 2015 [place], [place]. This information is not intended to replace advice given to you by your health care provider. Make sure you discuss any questions you have with your health care provider.

## 2014-02-07 NOTE — MAU Provider Note (Signed)
History     CSN: [id number]  Arrival date and time: 02/07/14 1139   None     Chief Complaint  Patient presents with  . Fall   HPI  Amy Downs is a 18 y.o. G1P0 pregnancy with di-di twins at 4990w0d who presents to the MAU for evaluation after a fall while at work today. States she was cleaning a shower when she slipped and fell on her abdomen. Felt some mild pain in her abdomen immediately following, but denies contractions, vaginal bleeding, loss of fluid. Good fetal movement. Sustained no other injuries. Was able to get up without assistance. Occurred at around 11:15 this morning.   OB History    Gravida Para Term Preterm AB TAB SAB Ectopic Multiple Living   1               Past Medical History  Diagnosis Date  . Abscess   . Medical history non-contributory   . HSV-2 infection     Past Surgical History  Procedure Laterality Date  . Wisdom tooth extraction      History reviewed. No pertinent family history.  History  Substance Use Topics  . Smoking status: Never Smoker   . Smokeless tobacco: Not on file  . Alcohol Use: No    Allergies: No Known Allergies  Prescriptions prior to admission  Medication Sig Dispense Refill Last Dose  . Doxylamine-Pyridoxine (DICLEGIS PO) Take 2 tablets by mouth at bedtime.   02/06/2014 at Unknown time  . Ferrous Sulfate (SLOW FE PO) Take 1 tablet by mouth daily.   02/06/2014 at Unknown time  . Prenatal Vit-Fe Fumarate-FA (PRENATAL VITAMINS) 28-0.8 MG TABS Take 1 tablet by mouth daily. 30 tablet 5 02/07/2014 at Unknown time  . valACYclovir (VALTREX) 1000 MG tablet Take 1,000 mg by mouth daily.    02/07/2014 at Unknown time  . EPINEPHrine (EPIPEN) 0.3 mg/0.3 mL SOAJ Inject 0.3 mg into the muscle as needed (for allergic reaction).   Emergency    Review of Systems  Constitutional: Negative.  Negative for fever, chills and malaise/fatigue.  HENT: Negative.  Negative for congestion and sore throat.   Eyes: Negative.  Negative for  double vision and photophobia.  Respiratory: Negative.  Negative for cough, shortness of breath and wheezing.   Cardiovascular: Negative.  Negative for chest pain and leg swelling.  Gastrointestinal: Positive for abdominal pain (mild, improving). Negative for nausea, vomiting, diarrhea and constipation.  Genitourinary: Negative.  Negative for dysuria, urgency, frequency and hematuria.  Musculoskeletal: Negative.  Negative for myalgias.  Skin: Negative.   Neurological: Negative.  Negative for weakness and headaches.  Psychiatric/Behavioral: Negative.   All other systems reviewed and are negative.  Physical Exam   Blood pressure 133/76, pulse 108, temperature 98.1 F (36.7 C), temperature source Oral, resp. rate 18, last menstrual period 07/19/2013.  Physical Exam  Nursing note and vitals reviewed. Constitutional: She is oriented to person, place, and time. She appears well-developed and well-nourished. No distress.  HENT:  Head: Normocephalic and atraumatic.  Cardiovascular: Normal rate.   Respiratory: Effort normal.  GI: Soft. She exhibits no distension. There is no tenderness. There is no rebound and no guarding.  Gravid  Neurological: She is alert and oriented to person, place, and time.  Skin: Skin is warm and dry.  Psychiatric: She has a normal mood and affect. Her behavior is normal.    MAU Course  Procedures  MDM CBC, Type and Screen SVE cl/th/hi NST: Twin A: reactive, 145/mod/+A/occasional variable, consistent with  gestational age NST: Twin B: reactive, 145/mod/+A/occasional variable, consistent with gestational age Monitored toco for 4 hours, uterine irritability but no contractions  Assessment and Plan  Amy Downs is a 18 y.o. G1P0 pregnancy with di-di twins at 4667w0d who presents for evaluation after a fall. Reactive NST x 2. Monitor toco for 4 hours with some uterine irritability, but no evidence of contractions.   # Blunt Trauma - Safe for discharge to home  with strict precautions.  - FM/Bleeding/PTL precautions given.  - Patient to follow up with primary OB later this week for re-check.   William DaltonMcEachern, [name] 02/07/2014, 4:51 PM

## 2014-02-07 NOTE — MAU Note (Signed)
Pt states she fell on the side of a tub & hit her abdomen @ approx 1120 this a.m.  Has been having lower & mid abd pain, also back pain since she fell.  States she is unsure if she is bleeding, has not been to restroom.  Gets PNC in W/S.

## 2014-06-27 ENCOUNTER — Encounter (HOSPITAL_COMMUNITY): Payer: Self-pay | Admitting: *Deleted

## 2015-01-04 DIAGNOSIS — Z98891 History of uterine scar from previous surgery: Secondary | ICD-10-CM | POA: Insufficient documentation

## 2015-02-01 ENCOUNTER — Encounter (HOSPITAL_COMMUNITY): Payer: Self-pay | Admitting: Radiology

## 2015-02-01 ENCOUNTER — Emergency Department (HOSPITAL_COMMUNITY): Payer: Medicaid Other

## 2015-02-01 ENCOUNTER — Emergency Department (HOSPITAL_COMMUNITY)
Admission: EM | Admit: 2015-02-01 | Discharge: 2015-02-01 | Disposition: A | Discharge status: Home / Self Care | Payer: Medicaid Other | Attending: Emergency Medicine | Admitting: Emergency Medicine

## 2015-02-01 DIAGNOSIS — Y990 Civilian activity done for income or pay: Secondary | ICD-10-CM | POA: Insufficient documentation

## 2015-02-01 DIAGNOSIS — W230XXA Caught, crushed, jammed, or pinched between moving objects, initial encounter: Secondary | ICD-10-CM | POA: Insufficient documentation

## 2015-02-01 DIAGNOSIS — Y9289 Other specified places as the place of occurrence of the external cause: Secondary | ICD-10-CM | POA: Insufficient documentation

## 2015-02-01 DIAGNOSIS — Z3A11 11 weeks gestation of pregnancy: Secondary | ICD-10-CM | POA: Diagnosis not present

## 2015-02-01 DIAGNOSIS — T1490XA Injury, unspecified, initial encounter: Secondary | ICD-10-CM

## 2015-02-01 DIAGNOSIS — Z8619 Personal history of other infectious and parasitic diseases: Secondary | ICD-10-CM | POA: Diagnosis not present

## 2015-02-01 DIAGNOSIS — O9A211 Injury, poisoning and certain other consequences of external causes complicating pregnancy, first trimester: Secondary | ICD-10-CM | POA: Diagnosis present

## 2015-02-01 DIAGNOSIS — Y9389 Activity, other specified: Secondary | ICD-10-CM | POA: Diagnosis not present

## 2015-02-01 DIAGNOSIS — Z872 Personal history of diseases of the skin and subcutaneous tissue: Secondary | ICD-10-CM | POA: Diagnosis not present

## 2015-02-01 DIAGNOSIS — Z79899 Other long term (current) drug therapy: Secondary | ICD-10-CM | POA: Insufficient documentation

## 2015-02-01 DIAGNOSIS — S6991XA Unspecified injury of right wrist, hand and finger(s), initial encounter: Secondary | ICD-10-CM | POA: Diagnosis not present

## 2015-02-01 DIAGNOSIS — M79644 Pain in right finger(s): Secondary | ICD-10-CM

## 2015-02-01 MED ORDER — ACETAMINOPHEN 325 MG PO TABS
650.0000 mg | ORAL_TABLET | Freq: Once | ORAL | Status: AC
  Administered 2015-02-01: 650 mg via ORAL
  Filled 2015-02-01: qty 2

## 2015-02-01 NOTE — ED Provider Notes (Signed)
CSN: [id number]     Arrival date & time 02/01/15  1642 History  By signing my name below, I, Octavia Heir, attest that this documentation has been prepared under the direction and in the presence of Melburn Hake, PA-C. Electronically Signed: Octavia Heir, ED Scribe. 02/01/2015. 5:30 PM.    Chief Complaint  Patient presents with  . Finger Injury      The history is provided by the patient. No language interpreter was used.   HPI Comments: Amy Downs is a 19 y.o. female who presents to the Emergency Department complaining of a constant, gradual worsening right 5th finger injury onset about 3 hours ago. She reports smashing her finger in between some carts and a wall today at work. She has pain with certain movements and she states it feels numb occasionally. Pt has been putting ice on the area to help with the pain but has not taken any medication. She denies any other injury. Pt is currently [redacted] weeks pregnant.  Past Medical History  Diagnosis Date  . Abscess   . Medical history non-contributory   . HSV-2 infection    Past Surgical History  Procedure Laterality Date  . Wisdom tooth extraction     No family history on file. Social History  Substance Use Topics  . Smoking status: Never Smoker   . Smokeless tobacco: Not on file  . Alcohol Use: No   OB History    Gravida Para Term Preterm AB TAB SAB Ectopic Multiple Living   2              Review of Systems  Musculoskeletal: Positive for arthralgias.  All other systems reviewed and are negative.     Allergies  Review of patient's allergies indicates no known allergies.  Home Medications   Prior to Admission medications   Medication Sig Start Date End Date Taking? Authorizing Provider  Doxylamine-Pyridoxine (DICLEGIS PO) Take 2 tablets by mouth at bedtime.    Historical Provider, MD  EPINEPHrine (EPIPEN) 0.3 mg/0.3 mL SOAJ Inject 0.3 mg into the muscle as needed (for allergic reaction).    Historical Provider, MD   Ferrous Sulfate (SLOW FE PO) Take 1 tablet by mouth daily.    Historical Provider, MD  Prenatal Vit-Fe Fumarate-FA (PRENATAL VITAMINS) 28-0.8 MG TABS Take 1 tablet by mouth daily. 08/22/13   Wilmer Floor Leftwich-Kirby, CNM  valACYclovir (VALTREX) 1000 MG tablet Take 1,000 mg by mouth daily.     Historical Provider, MD   Triage vitals: BP 122/72 mmHg  Pulse 70  Temp(Src) 97.8 F (36.6 C) (Oral)  Resp 18  SpO2 100%  LMP 11/19/2014 (Approximate) Physical Exam  Constitutional: She appears well-developed and well-nourished. No distress.  HENT:  Head: Normocephalic and atraumatic.  Eyes: Right eye exhibits no discharge. Left eye exhibits no discharge.  Pulmonary/Chest: Effort normal. No respiratory distress.  Musculoskeletal: She exhibits tenderness.  Mild swelling noted to right pinky and MCP joint, full active and passive ROM of right 5th MCP/PIP and DIP, sensation intact, cap refill <2, 5/5 strength, 2+ radial pulses, tenderness at right 5th MCP and proximal phalanx, no abrasions/contusions/lacerations noted.  Neurological: She is alert. Coordination normal.  Skin: No rash noted. She is not diaphoretic.  Psychiatric: She has a normal mood and affect. Her behavior is normal.  Nursing note and vitals reviewed.   ED Course  Procedures  DIAGNOSTIC STUDIES: Oxygen Saturation is 100% on RA, normal by my interpretation.  COORDINATION OF CARE:  5:20 PM Discussed treatment plan with  pt at bedside and pt agreed to plan.  Labs Review Labs Reviewed - No data to display  Imaging Review Dg Finger Little Right  02/01/2015  CLINICAL DATA:  Pain after finger caught between wall and cart earlier today EXAM: RIGHT FIFTH FINGER 2+V COMPARISON:  Right hand October 29, 2011 FINDINGS: Frontal, oblique, and lateral views obtained. There is soft tissue swelling over the fifth proximal phalanx. There is no demonstrable fracture or dislocation. Joint spaces appear intact. No erosive change. IMPRESSION: Soft  tissue swelling. No fracture or dislocation. No appreciable arthropathy. Electronically Signed   By: Bretta BangWilliam  Woodruff III M.D.   On: 02/01/2015 17:42   I have personally reviewed and evaluated these images and lab results as part of my medical decision-making.  Filed Vitals:   02/01/15 1647  BP: 122/72  Pulse: 70  Temp: 97.8 F (36.6 C)  Resp: 18     MDM   Final diagnoses:  Injury  Pain of finger of right hand    Patient presents with pain to right pinky finger after smashing her finger at work. VSS. Exam revealed mild swelling to right pinky extending to MCP joint, right pinky and hand otherwise neurovascularly intact.  Right finger x-ray revealed no acute fracture or dislocation. I suspect pain and swelling are likely due to contusion associated with recent injury. Plan to discharge patient home. Advised patient to take Tylenol for pain, due to being [redacted] weeks pregnant, and to continue using RICE tx.   Evaluation does not show pathology requring ongoing emergent intervention or admission. Pt is hemodynamically stable and mentating appropriately. Discussed findings/results and plan with patient/guardian, who agrees with plan. All questions answered. Return precautions discussed and outpatient follow up given.    I personally performed the services described in this documentation, which was scribed in my presence. The recorded information has been reviewed and is accurate.   Satira Sarkicole Elizabeth LeedsNadeau, [place] JerseyPA-C 02/01/15 1857  Arby BarretteMarcy Pfeiffer, MD 02/06/15 [phone]

## 2015-02-01 NOTE — Discharge Instructions (Signed)
You may continue taking Tylenol as prescribed over-the-counter for pain relief. I also recommend continuing to elevate and ice your hand for 15-20 minutes 3-4 times daily. Follow-up with your primary care provider in the next week. Emergency department if symptoms worsen or new onset of redness, swelling, drainage, numbness, tingling, weakness.

## 2015-02-01 NOTE — ED Notes (Signed)
Pt injured 5th digits of right hand closing it in a door at work.  Pt placed ice on finger but upon suggestion of coworkers, came to the emergency room for treatment.

## 2015-02-16 ENCOUNTER — Telehealth: Payer: Self-pay

## 2016-07-08 DIAGNOSIS — A6000 Herpesviral infection of urogenital system, unspecified: Secondary | ICD-10-CM | POA: Insufficient documentation

## 2017-01-03 ENCOUNTER — Other Ambulatory Visit: Payer: Self-pay

## 2017-01-03 ENCOUNTER — Inpatient Hospital Stay (HOSPITAL_COMMUNITY)
Admission: AD | Admit: 2017-01-03 | Discharge: 2017-01-03 | Disposition: A | Discharge status: Home / Self Care | Payer: Medicaid Other | Source: Ambulatory Visit | Attending: Obstetrics and Gynecology | Admitting: Obstetrics and Gynecology

## 2017-01-03 ENCOUNTER — Encounter (HOSPITAL_COMMUNITY): Payer: Self-pay | Admitting: *Deleted

## 2017-01-03 DIAGNOSIS — R102 Pelvic and perineal pain unspecified side: Secondary | ICD-10-CM

## 2017-01-03 DIAGNOSIS — A599 Trichomoniasis, unspecified: Secondary | ICD-10-CM

## 2017-01-03 DIAGNOSIS — Z79899 Other long term (current) drug therapy: Secondary | ICD-10-CM | POA: Diagnosis not present

## 2017-01-03 DIAGNOSIS — R112 Nausea with vomiting, unspecified: Secondary | ICD-10-CM | POA: Diagnosis present

## 2017-01-03 LAB — WET PREP, GENITAL
SPERM: NONE SEEN
Yeast Wet Prep HPF POC: NONE SEEN

## 2017-01-03 LAB — URINALYSIS, ROUTINE W REFLEX MICROSCOPIC
BILIRUBIN URINE: NEGATIVE
Glucose, UA: NEGATIVE mg/dL
KETONES UR: NEGATIVE mg/dL
Nitrite: NEGATIVE
PH: 6 (ref 5.0–8.0)
PROTEIN: NEGATIVE mg/dL
Specific Gravity, Urine: 1.021 (ref 1.005–1.030)

## 2017-01-03 LAB — POCT PREGNANCY, URINE: PREG TEST UR: NEGATIVE

## 2017-01-03 MED ORDER — ONDANSETRON 4 MG PO TBDP: 4.0000 mg | ORAL_TABLET | Freq: Three times a day (TID) | ORAL | 0 refills | Status: DC | PRN

## 2017-01-03 MED ORDER — METRONIDAZOLE 500 MG PO TABS
2000.0000 mg | ORAL_TABLET | Freq: Once | ORAL | Status: AC
  Administered 2017-01-03: 2000 mg via ORAL
  Filled 2017-01-03: qty 4

## 2017-01-03 MED ORDER — ONDANSETRON 8 MG PO TBDP
8.0000 mg | ORAL_TABLET | Freq: Once | ORAL | Status: AC
  Administered 2017-01-03: 8 mg via ORAL
  Filled 2017-01-03: qty 1

## 2017-01-03 MED ORDER — KETOROLAC TROMETHAMINE 60 MG/2ML IM SOLN
60.0000 mg | Freq: Once | INTRAMUSCULAR | Status: AC
  Administered 2017-01-03: 60 mg via INTRAMUSCULAR
  Filled 2017-01-03: qty 2

## 2017-01-03 NOTE — MAU Provider Note (Signed)
History     CSN: [id number]  Arrival date and time: 01/03/17 1600   First Provider Initiated Contact with Patient 01/03/17 1652      Chief Complaint  Patient presents with  . Pelvic Pain   Amy Downs is a 21 y.o. [id number] who presents today with lower abdominal pain x 3 days. She saw her PCP, and was dx with a UTI. She was given an antibiotic, but she has not started that medication at this time. She states that they were unable to locate her IUD when she was seen in the office yesterday. She has had spotting off and on. Today she has nausea and vomiting.    Pelvic Pain  The patient's primary symptoms include pelvic pain. The patient's pertinent negatives include no vaginal discharge. This is a new problem. The current episode started in the past 7 days. The problem occurs intermittently. The problem has been unchanged. The pain is moderate. The problem affects the left side. She is not pregnant. Associated symptoms include nausea and vomiting. Pertinent negatives include no chills, dysuria, fever or frequency. The vaginal bleeding is spotting. She has not been passing clots. She has not been passing tissue. Nothing aggravates the symptoms. She has tried acetaminophen for the symptoms. The treatment provided no relief. She uses an IUD for contraception.   Past Medical History:  Diagnosis Date  . Abscess   . HSV-2 infection   . Medical history non-contributory     Past Surgical History:  Procedure Laterality Date  . CESAREAN SECTION    . WISDOM TOOTH EXTRACTION      No family history on file.  Social History   Tobacco Use  . Smoking status: Never Smoker  . Smokeless tobacco: Never Used  Substance Use Topics  . Alcohol use: No  . Drug use: No    Allergies: No Known Allergies  Medications Prior to Admission  Medication Sig Dispense Refill Last Dose  . Doxylamine-Pyridoxine (DICLEGIS PO) Take 2 tablets by mouth at bedtime.   02/06/2014 at Unknown time  . EPINEPHrine  (EPIPEN) 0.3 mg/0.3 mL SOAJ Inject 0.3 mg into the muscle as needed (for allergic reaction).   Emergency  . Ferrous Sulfate (SLOW FE PO) Take 1 tablet by mouth daily.   02/06/2014 at Unknown time  . Prenatal Vit-Fe Fumarate-FA (PRENATAL VITAMINS) 28-0.8 MG TABS Take 1 tablet by mouth daily. 30 tablet 5 02/07/2014 at Unknown time  . valACYclovir (VALTREX) 1000 MG tablet Take 1,000 mg by mouth daily.    02/07/2014 at Unknown time    Review of Systems  Constitutional: Negative for chills and fever.  Gastrointestinal: Positive for nausea and vomiting.  Genitourinary: Positive for pelvic pain and vaginal bleeding. Negative for dysuria, frequency and vaginal discharge.   Physical Exam   Blood pressure 129/78, pulse 78, temperature 98.6 F (37 C), temperature source Oral, resp. rate 16, height 5\' 9"  (1.753 m), weight (!) 309 lb 4 oz (140.3 kg), SpO2 100 %, unknown if currently breastfeeding.  Physical Exam  Nursing note and vitals reviewed. Constitutional: She is oriented to person, place, and time. She appears well-developed and well-nourished. No distress.  HENT:  Head: Normocephalic.  Cardiovascular: Normal rate.  Respiratory: Effort normal.  GI: Soft. There is no tenderness. There is no rebound.  Genitourinary:  Genitourinary Comments:  External: no lesion Vagina: small amount of white discharge Cervix: pink, smooth, no CMT, IUD tail seen at the os.  Uterus: NSSC Adnexa: NT   Neurological: She is alert and  oriented to person, place, and time.  Skin: Skin is warm and dry.  Psychiatric: She has a normal mood and affect.   Results for orders placed or performed during the hospital encounter of 01/03/17 (from the past 24 hour(s))  Urinalysis, Routine w reflex microscopic     Status: Abnormal   Collection Time: 01/03/17  4:18 PM  Result Value Ref Range   Color, Urine YELLOW YELLOW   APPearance CLEAR CLEAR   Specific Gravity, Urine 1.021 1.005 - 1.030   pH 6.0 5.0 - 8.0   Glucose,  UA NEGATIVE NEGATIVE mg/dL   Hgb urine dipstick SMALL (A) NEGATIVE   Bilirubin Urine NEGATIVE NEGATIVE   Ketones, ur NEGATIVE NEGATIVE mg/dL   Protein, ur NEGATIVE NEGATIVE mg/dL   Nitrite NEGATIVE NEGATIVE   Leukocytes, UA LARGE (A) NEGATIVE   RBC / HPF 0-5 0 - 5 RBC/hpf   WBC, UA 6-30 0 - 5 WBC/hpf   Bacteria, UA RARE (A) NONE SEEN   Squamous Epithelial / LPF 0-5 (A) NONE SEEN   Mucus PRESENT   Pregnancy, urine POC     Status: None   Collection Time: 01/03/17  4:46 PM  Result Value Ref Range   Preg Test, Ur NEGATIVE NEGATIVE  Wet prep, genital     Status: Abnormal   Collection Time: 01/03/17  5:11 PM  Result Value Ref Range   Yeast Wet Prep HPF POC NONE SEEN NONE SEEN   Trich, Wet Prep PRESENT (A) NONE SEEN   Clue Cells Wet Prep HPF POC PRESENT (A) NONE SEEN   WBC, Wet Prep HPF POC MANY (A) NONE SEEN   Sperm NONE SEEN      MAU Course  Procedures  MDM Advised patient that IUD is clearly visible today, and that a UTI can be painful. recommned that she takes the rx provided by her PCP.  Patient has had zofran and toradol here. She reports that she is feeling better. Treated with 2 g flagyl here in MAU    Assessment and Plan   1. Pelvic pain in female   2. Trichomoniasis    DC home Comfort measures reviewed  RX: zofran PRN #[id number]  Return to MAU as needed FU with PCP   Follow-up Information    [facility].   Contact information: [address] [place] [address] [phone]            Thressa ShellerHeather Hogan 01/03/2017, 4:56 PM

## 2017-01-03 NOTE — Discharge Instructions (Signed)
In late 2019, the [facility] will be moving to the [place] campus. At that time, the MAU (Maternity Admissions Unit), where you are being seen today, will no longer see non-pregnant patients. We strongly encourage you to find a doctor's office before that time, so that you can be seen with any GYN concerns, like vaginal discharge, urinary tract infection, etc.. in a timely manner.  ° °In order to make the office visit more convenient, the [facility] will be offering evening hours from 4pm-7:30pm on Monday. There will be same-day appointments, walk-in appointments and scheduled appointments available during this time. We will be adding more evening hours over the next year before the move.  ° °[facility] - [phone] ° °For urgent needs, [facility] is also available for management of urgent GYN complaints such as vaginal discharge or urinary tract infections.  ° ° ° °Primary care follow up  °[facility] (will see you even if you do not have sickle cell): [phone] °[facility]: [phone] °[facility]: [phone] ° ° °Trichomoniasis °Trichomoniasis is an STI (sexually transmitted infection) that can affect both women and men. In women, the outer area of the female genitalia (vulva) and the vagina are affected. In men, the penis is mainly affected, but the prostate and other reproductive organs can also be involved. This condition can be treated with medicine. It often has no symptoms (is asymptomatic), especially in men. °What are the causes? °This condition is caused by an organism called Trichomonas vaginalis. Trichomoniasis most often spreads from person to person (is contagious) through sexual contact. °What increases the risk? °The following factors may make you more likely to develop this condition: °· Having unprotected sexual intercourse. °· Having sexual  intercourse with a partner who has trichomoniasis. °· Having multiple sexual partners. °· Having had previous trichomoniasis infections or other STIs. ° °What are the signs or symptoms? °In women, symptoms of trichomoniasis include: °· Abnormal vaginal discharge that is clear, white, gray, or yellow-green and foamy and has an unusual "fishy" odor. °· Itching and irritation of the vagina and vulva. °· Burning or pain during urination or sexual intercourse. °· Genital redness and swelling. ° °In men, symptoms of trichomoniasis include: °· Penile discharge that may be foamy or contain pus. °· Pain in the penis. This may happen only when urinating. °· Itching or irritation inside the penis. °· Burning after urination or ejaculation. ° °How is this diagnosed? °In women, this condition may be found during a routine Pap test or physical exam. It may be found in men during a routine physical exam. Your health care provider may perform tests to help diagnose this infection, such as: °· Urine tests (men and women). °· The following in women: °? Testing the pH of the vagina. °? A vaginal swab test that checks for the Trichomonas vaginalis organism. °? Testing vaginal secretions. ° °Your health care provider may test you for other STIs, including HIV (human immunodeficiency virus). °How is this treated? °This condition is treated with medicine taken by mouth (orally), such as metronidazole or tinidazole to fight the infection. Your sexual partner(s) may also need to be tested and treated. °· If you are a woman and you plan to become pregnant or think you may be pregnant, tell your health care provider right away. Some medicines that are used to treat the infection should not be taken during pregnancy. ° °Your   health care provider may recommend over-the-counter medicines or creams to help relieve itching or irritation. You may be tested for infection again 3 months after treatment. °Follow these instructions at home: °· Take and  use over-the-counter and prescription medicines, including creams, only as told by your health care provider. °· Do not have sexual intercourse until one week after you finish your medicine, or until your health care provider approves. Ask your health care provider when you may resume sexual intercourse. °· (Women) Do not douche or wear tampons while you have the infection. °· Discuss your infection with your sexual partner(s). Make sure that your partner gets tested and treated, if necessary. °· Keep all follow-up visits as told by your health care provider. This is important. °How is this prevented? °· Use condoms every time you have sex. Using condoms correctly and consistently can help protect against STIs. °· Avoid having multiple sexual partners. °· Talk with your sexual partner about any symptoms that either of you may have, as well as any history of STIs. °· Get tested for STIs and STDs (sexually transmitted diseases) before you have sex. Ask your partner to do the same. °· Do not have sexual contact if you have symptoms of trichomoniasis or another STI. °Contact a health care provider if: °· You still have symptoms after you finish your medicine. °· You develop pain in your abdomen. °· You have pain when you urinate. °· You have bleeding after sexual intercourse. °· You develop a rash. °· You feel nauseous or you vomit. °· You plan to become pregnant or think you may be pregnant. °Summary °· Trichomoniasis is an STI (sexually transmitted infection) that can affect both women and men. °· This condition often has no symptoms (is asymptomatic), especially in men. °· You should not have sexual intercourse until one week after you finish your medicine, or until your health care provider approves. Ask your health care provider when you may resume sexual intercourse. °· Discuss your infection with your sexual partner. Make sure that your partner gets tested and treated, if necessary. °This information is not  intended to replace advice given to you by your health care provider. Make sure you discuss any questions you have with your health care provider. °Document Released: 07/31/2000 Document Revised: 12/29/2015 Document Reviewed: 12/29/2015 °Elsevier Interactive Patient Education © 2017 [employer] ° ° °

## 2017-01-03 NOTE — MAU Note (Signed)
Yesterday went to dr's, was having preg symptoms.  Has Mirena.  On exam they were unable to locate.  Did blood preg test(urine was neg), no results yet.  Was to schedule US once they got the results.  Started having pain- esp on left side, is throwing up and has started spotting.

## 2017-01-06 LAB — GC/CHLAMYDIA PROBE AMP (~~LOC~~) NOT AT ARMC
CHLAMYDIA, DNA PROBE: NEGATIVE
NEISSERIA GONORRHEA: NEGATIVE

## 2017-02-23 ENCOUNTER — Encounter (HOSPITAL_COMMUNITY): Payer: Self-pay

## 2017-02-23 ENCOUNTER — Other Ambulatory Visit: Payer: Self-pay

## 2017-02-23 ENCOUNTER — Inpatient Hospital Stay (HOSPITAL_COMMUNITY)
Admission: AD | Admit: 2017-02-23 | Discharge: 2017-02-23 | Disposition: A | Discharge status: Home / Self Care | Payer: Medicaid Other | Source: Ambulatory Visit | Attending: Obstetrics & Gynecology | Admitting: Obstetrics & Gynecology

## 2017-02-23 DIAGNOSIS — R51 Headache: Secondary | ICD-10-CM | POA: Insufficient documentation

## 2017-02-23 DIAGNOSIS — R109 Unspecified abdominal pain: Secondary | ICD-10-CM | POA: Diagnosis present

## 2017-02-23 DIAGNOSIS — Z975 Presence of (intrauterine) contraceptive device: Secondary | ICD-10-CM | POA: Diagnosis not present

## 2017-02-23 DIAGNOSIS — Z79899 Other long term (current) drug therapy: Secondary | ICD-10-CM | POA: Insufficient documentation

## 2017-02-23 DIAGNOSIS — R112 Nausea with vomiting, unspecified: Secondary | ICD-10-CM

## 2017-02-23 DIAGNOSIS — R519 Headache, unspecified: Secondary | ICD-10-CM

## 2017-02-23 HISTORY — DX: Anxiety disorder, unspecified: F41.9

## 2017-02-23 LAB — URINALYSIS, ROUTINE W REFLEX MICROSCOPIC
BILIRUBIN URINE: NEGATIVE
Glucose, UA: NEGATIVE mg/dL
HGB URINE DIPSTICK: NEGATIVE
KETONES UR: NEGATIVE mg/dL
NITRITE: NEGATIVE
PH: 7 (ref 5.0–8.0)
Protein, ur: 30 mg/dL — AB
SPECIFIC GRAVITY, URINE: 1.029 (ref 1.005–1.030)

## 2017-02-23 LAB — WET PREP, GENITAL
Clue Cells Wet Prep HPF POC: NONE SEEN
SPERM: NONE SEEN
Trich, Wet Prep: NONE SEEN
Yeast Wet Prep HPF POC: NONE SEEN

## 2017-02-23 LAB — POCT PREGNANCY, URINE: PREG TEST UR: NEGATIVE

## 2017-02-23 MED ORDER — ONDANSETRON 8 MG PO TBDP
8.0000 mg | ORAL_TABLET | Freq: Once | ORAL | Status: AC
  Administered 2017-02-23: 8 mg via ORAL
  Filled 2017-02-23: qty 1

## 2017-02-23 MED ORDER — ONDANSETRON 4 MG PO TBDP: 4.0000 mg | ORAL_TABLET | Freq: Three times a day (TID) | ORAL | 0 refills | Status: DC | PRN

## 2017-02-23 MED ORDER — BUTALBITAL-APAP-CAFFEINE 50-325-40 MG PO TABS: 1.0000 | ORAL_TABLET | Freq: Four times a day (QID) | ORAL | 0 refills | Status: AC | PRN

## 2017-02-23 MED ORDER — BUTALBITAL-APAP-CAFFEINE 50-325-40 MG PO TABS
2.0000 | ORAL_TABLET | Freq: Once | ORAL | Status: AC
  Administered 2017-02-23: 2 via ORAL
  Filled 2017-02-23: qty 2

## 2017-02-23 NOTE — Progress Notes (Addendum)
G2P3 nonpregnant. Here dt IUD issues and ha that comes and goes. Denies pain at the moment. Denies bleeding.   VSS see flow sheet for details.   Pt is 307 lbs. Discussed weight control, diet and exercise. Pt states joined the [employer] but states been lazy and eats "junk" food and aware she needs to change her bad habits.   1126: provider at bs assessing pt and discussing POC  1155: GC wet prep and pelvic exam done.

## 2017-02-23 NOTE — Discharge Instructions (Signed)

## 2017-02-23 NOTE — MAU Provider Note (Signed)
History  CSN: [id number] Arrival date and time: 02/23/17 1027  First Provider Initiated Contact with Patient 02/23/17 1126      Chief Complaint  Patient presents with  . Abdominal Pain  . Headache  . Nausea    HPI: Icess Bertoni is a 22 y.o. [phone] who presents to maternity admissions reporting persistent headache x 2 days. Reports headache behind left eye on/off since yesterday; unrelieved with ibuprofen 400 mg, and she was also given Tramadol by a friend, which she took twice, but did not help. This morning she threw up twice and has has felt mild abdominal cramping. Denies diarrhea, fever, chills, rhinorrhea, congestion, cough, or body aches. Still feeling nauseous and has headache. Denies visual disturbances, blurry vision, focal weakness, or other associated symptoms. Denies hx of migraines.  Also denies dysuria, hematuria, urinary frequency, abnormal vaginal discharge or bleeding, but reports she has spotting a few days ago. She has IUD and does not have regular menses on it.    OB History  Gravida Para Term Preterm AB Living  2 2 1 1   3   SAB TAB Ectopic Multiple Live Births        1 3    # Outcome Date GA Lbr Len/2nd Weight Sex Delivery Anes PTL Lv  2A Preterm  [redacted]w[redacted]d    CS-LTranv   LIV  2B Preterm      CS-LTranv   LIV  1 Term  [redacted]w[redacted]d    VBAC   LIV     Past Medical History:  Diagnosis Date  . Abscess   . Anxiety    no meds  . HSV-2 infection   . Medical history non-contributory    Past Surgical History:  Procedure Laterality Date  . CESAREAN SECTION     twin IUP with PreE  . WISDOM TOOTH EXTRACTION     Social History   Socioeconomic History  . Marital status: Single    Spouse name: Not on file  . Number of children: Not on file  . Years of education: Not on file  . Highest education level: Not on file  Social Needs  . Financial resource strain: Not on file  . Food insecurity - worry: Not on file  . Food insecurity - inability: Not on file  . Transportation  needs - medical: Not on file  . Transportation needs - non-medical: Not on file  Occupational History  . Not on file  Tobacco Use  . Smoking status: Never Smoker  . Smokeless tobacco: Never Used  Substance and Sexual Activity  . Alcohol use: No  . Drug use: No  . Sexual activity: Yes    Birth control/protection: IUD  Other Topics Concern  . Not on file  Social History Narrative  . Not on file   No Known Allergies  Medications Prior to Admission  Medication Sig Dispense Refill Last Dose  . Doxylamine-Pyridoxine (DICLEGIS PO) Take 2 tablets by mouth at bedtime.   02/06/2014 at Unknown time  . EPINEPHrine (EPIPEN) 0.3 mg/0.3 mL SOAJ Inject 0.3 mg into the muscle as needed (for allergic reaction).   Emergency  . Ferrous Sulfate (SLOW FE PO) Take 1 tablet by mouth daily.   02/06/2014 at Unknown time  . ondansetron (ZOFRAN ODT) 4 MG disintegrating tablet Take 1 tablet (4 mg total) every 8 (eight) hours as needed by mouth for nausea or vomiting. 20 tablet 0   . Prenatal Vit-Fe Fumarate-FA (PRENATAL VITAMINS) 28-0.8 MG TABS Take 1 tablet by mouth daily. 30  tablet 5 02/07/2014 at Unknown time  . valACYclovir (VALTREX) 1000 MG tablet Take 1,000 mg by mouth daily.    02/07/2014 at Unknown time    I have reviewed patient's Past Medical Hx, Surgical Hx, Family Hx, Social Hx, medications and allergies.   Review of Systems: Negative except for what is mentioned in HPI.  Physical Exam   Weight (!) 307 lb 8 oz (139.5 kg), unknown if currently breastfeeding.  Constitutional: Well-developed, well-nourished female in no acute distress.  HENT: [place]/AT, normal oropharynx mucosa. MMM Eyes: normal conjunctivae, no scleral icterus, no nystagmus, EOMI Cardiovascular: normal rate, regular rhythm Respiratory: normal effort, lungs CTAB.  GI: Abd soft, non-tender, non-distended, normoactive bowel sounds Pelvic: NEFG. Normal vaginal mucosa without lesions; cervix pink, normal appearing, with IUD string  coming from cervical os; scant vaginal discharge. Cervix firm, neg CMT, uterus nontender, nonenlarged, adnexa without tenderness, enlargement, or mass MSK: Extremities nontender, no edema Neurologic: Alert and oriented x 4. CNs II-XII intact. Strength 5/5 on all 4 extremities. DTRs 2+ bilaterally Psych: Normal mood and affect Skin: warm and dry    MAU Course/MDM:   Nursing notes and VS reviewed. Patient seen and examined, as noted above.  Wet prep, GC/Chlamydia collected UPT neg.   Meds ordered: Zofran, Fioricet   On re-evaluation, patient's symptoms improved.  Assessment and Plan  Assessment: 1. Nonintractable headache, unspecified chronicity pattern, unspecified headache type   2. Non-intractable vomiting with nausea, unspecified vomiting type     Plan: --Discharge home in stable condition, rx for Fiorecet, and zofran --Discussed return precautions  Faustine Tates, Kandra NicolasJulie P, MD 02/23/2017 11:26 AM

## 2017-02-23 NOTE — MAU Note (Signed)
Headache last 2 days, took meds not helping.  Vomiting and cramping started today. Had some spotting a couple wks ago.

## 2017-02-24 LAB — GC/CHLAMYDIA PROBE AMP (~~LOC~~) NOT AT ARMC
CHLAMYDIA, DNA PROBE: NEGATIVE
Neisseria Gonorrhea: NEGATIVE

## 2017-03-17 ENCOUNTER — Telehealth (HOSPITAL_COMMUNITY): Payer: Self-pay | Admitting: *Deleted

## 2017-03-17 NOTE — MAU Note (Signed)
Prescription was left at admission desk for pt. But was never picked up. Voided out on January 28'19 at 0823. (Zofran and Fioricet, ESGIC) prescribing doctor Nolene EbbsJulie Degele

## 2017-03-17 NOTE — Telephone Encounter (Signed)
Pt never picked up prescription from Jan 6'19. Voided out on Jan 28'19 (Zofran and Fioricet, Esgic) prescribing doctor Raynelle FanningJulie Degele

## 2017-03-22 ENCOUNTER — Inpatient Hospital Stay (HOSPITAL_COMMUNITY)
Admission: AD | Admit: 2017-03-22 | Discharge: 2017-03-22 | Disposition: A | Discharge status: Home / Self Care | Payer: Medicaid Other | Source: Ambulatory Visit | Attending: Obstetrics and Gynecology | Admitting: Obstetrics and Gynecology

## 2017-03-22 ENCOUNTER — Encounter (HOSPITAL_COMMUNITY): Payer: Self-pay

## 2017-03-22 DIAGNOSIS — N921 Excessive and frequent menstruation with irregular cycle: Secondary | ICD-10-CM

## 2017-03-22 DIAGNOSIS — R1030 Lower abdominal pain, unspecified: Secondary | ICD-10-CM | POA: Insufficient documentation

## 2017-03-22 DIAGNOSIS — Z3202 Encounter for pregnancy test, result negative: Secondary | ICD-10-CM | POA: Insufficient documentation

## 2017-03-22 DIAGNOSIS — N939 Abnormal uterine and vaginal bleeding, unspecified: Secondary | ICD-10-CM | POA: Insufficient documentation

## 2017-03-22 LAB — URINALYSIS, ROUTINE W REFLEX MICROSCOPIC
BACTERIA UA: NONE SEEN
BILIRUBIN URINE: NEGATIVE
Glucose, UA: NEGATIVE mg/dL
Ketones, ur: NEGATIVE mg/dL
NITRITE: NEGATIVE
PROTEIN: NEGATIVE mg/dL
Specific Gravity, Urine: 1.027 (ref 1.005–1.030)
pH: 6 (ref 5.0–8.0)

## 2017-03-22 LAB — CBC
HCT: 36.7 % (ref 36.0–46.0)
Hemoglobin: 12 g/dL (ref 12.0–15.0)
MCH: 27.9 pg (ref 26.0–34.0)
MCHC: 32.7 g/dL (ref 30.0–36.0)
MCV: 85.3 fL (ref 78.0–100.0)
Platelets: 311 10*3/uL (ref 150–400)
RBC: 4.3 MIL/uL (ref 3.87–5.11)
RDW: 13.6 % (ref 11.5–15.5)
WBC: 4.6 10*3/uL (ref 4.0–10.5)

## 2017-03-22 LAB — POCT PREGNANCY, URINE: Preg Test, Ur: NEGATIVE

## 2017-03-22 NOTE — MAU Note (Signed)
Pt reports mild bleeding for 4 days, also reports bil breast pain off/on, for the last 2 days she has been having pressure in her rectum that shoots down her left leg. Also reports that her right tonsil is swollen and wants to see about that too. Pt reports she had her IUD removed on 01/15

## 2017-03-22 NOTE — Progress Notes (Addendum)
Non pregn here for sharp shooting pain down the leg and cramping and when she has these states she bleeds in clots. Also complains of sharp pain on bilateral breast.   Two weeks ago IUD removed.   Last wk went to Med firist for pregn test that was negative.  UPT today negative.   1400: provider at bs. Ordered for CBC

## 2017-03-22 NOTE — Discharge Instructions (Signed)
Contraception Choices Contraception, also called birth control, refers to methods or devices that prevent pregnancy. Hormonal methods Contraceptive implant A contraceptive implant is a thin, plastic tube that contains a hormone. It is inserted into the upper part of the arm. It can remain in place for up to 3 years. Progestin-only injections Progestin-only injections are injections of progestin, a synthetic form of the hormone progesterone. They are given every 3 months by a health care provider. Birth control pills Birth control pills are pills that contain hormones that prevent pregnancy. They must be taken once a day, preferably at the same time each day. Birth control patch The birth control patch contains hormones that prevent pregnancy. It is placed on the skin and must be changed once a week for three weeks and removed on the fourth week. A prescription is needed to use this method of contraception. Vaginal ring A vaginal ring contains hormones that prevent pregnancy. It is placed in the vagina for three weeks and removed on the fourth week. After that, the process is repeated with a new ring. A prescription is needed to use this method of contraception. Emergency contraceptive Emergency contraceptives prevent pregnancy after unprotected sex. They come in pill form and can be taken up to 5 days after sex. They work best the sooner they are taken after having sex. Most emergency contraceptives are available without a prescription. This method should not be used as your only form of birth control. Barrier methods Female condom A female condom is a thin sheath that is worn over the penis during sex. Condoms keep sperm from going inside a woman's body. They can be used with a spermicide to increase their effectiveness. They should be disposed after a single use. Female condom A female condom is a soft, loose-fitting sheath that is put into the vagina before sex. The condom keeps sperm from going  inside a woman's body. They should be disposed after a single use. Diaphragm A diaphragm is a soft, dome-shaped barrier. It is inserted into the vagina before sex, along with a spermicide. The diaphragm blocks sperm from entering the uterus, and the spermicide kills sperm. A diaphragm should be left in the vagina for 6-8 hours after sex and removed within 24 hours. A diaphragm is prescribed and fitted by a health care provider. A diaphragm should be replaced every 1-2 years, after giving birth, after gaining more than 15 lb (6.8 kg), and after pelvic surgery. Cervical cap A cervical cap is a round, soft latex or plastic cup that fits over the cervix. It is inserted into the vagina before sex, along with spermicide. It blocks sperm from entering the uterus. The cap should be left in place for 6-8 hours after sex and removed within 48 hours. A cervical cap must be prescribed and fitted by a health care provider. It should be replaced every 2 years. Sponge A sponge is a soft, circular piece of polyurethane foam with spermicide on it. The sponge helps block sperm from entering the uterus, and the spermicide kills sperm. To use it, you make it wet and then insert it into the vagina. It should be inserted before sex, left in for at least 6 hours after sex, and removed and thrown away within 30 hours. Spermicides Spermicides are chemicals that kill or block sperm from entering the cervix and uterus. They can come as a cream, jelly, suppository, foam, or tablet. A spermicide should be inserted into the vagina with an applicator at least 10-27 minutes before  sex to allow time for it to work. The process must be repeated every time you have sex. Spermicides do not require a prescription. Intrauterine contraception Intrauterine device (IUD) An IUD is a T-shaped device that is put in a woman's uterus. There are two types:  Hormone IUD.This type contains progestin, a synthetic form of the hormone progesterone. This  type can stay in place for 3-5 years.  Copper IUD.This type is wrapped in copper wire. It can stay in place for 10 years.  Permanent methods of contraception Female tubal ligation In this method, a woman's fallopian tubes are sealed, tied, or blocked during surgery to prevent eggs from traveling to the uterus. Hysteroscopic sterilization In this method, a small, flexible insert is placed into each fallopian tube. The inserts cause scar tissue to form in the fallopian tubes and block them, so sperm cannot reach an egg. The procedure takes about 3 months to be effective. Another form of birth control must be used during those 3 months. Female sterilization This is a procedure to tie off the tubes that carry sperm (vasectomy). After the procedure, the man can still ejaculate fluid (semen). Natural planning methods Natural family planning In this method, a couple does not have sex on days when the woman could become pregnant. Calendar method This means keeping track of the length of each menstrual cycle, identifying the days when pregnancy can happen, and not having sex on those days. Ovulation method In this method, a couple avoids sex during ovulation. Symptothermal method This method involves not having sex during ovulation. The woman typically checks for ovulation by watching changes in her temperature and in the consistency of cervical mucus. Post-ovulation method In this method, a couple waits to have sex until after ovulation. Summary  Contraception, also called birth control, means methods or devices that prevent pregnancy.  Hormonal methods of contraception include implants, injections, pills, patches, vaginal rings, and emergency contraceptives.  Barrier methods of contraception can include female condoms, female condoms, diaphragms, cervical caps, sponges, and spermicides.  There are two types of IUDs (intrauterine devices). An IUD can be put in a woman's uterus to prevent pregnancy  for 3-5 years.  Permanent sterilization can be done through a procedure for males, females, or both.  Natural family planning methods involve not having sex on days when the woman could become pregnant. This information is not intended to replace advice given to you by your health care provider. Make sure you discuss any questions you have with your health care provider. Document Released: 02/04/2005 Document Revised: 03/09/2016 Document Reviewed: 03/09/2016 Elsevier Interactive Patient Education  2018 [employer]. [facility] Providers    [facility] [employer] [facility]       Phone: [phone]  Center for [facility] [place] Phone: [phone]  Center for [employer] at [facility] [place]  Phone: [phone]  [facility] for [facility] at [facility]  Phone: [phone]  Center for [facility] [place]  Phone: [phone]  [facility]       Phone: [phone]  [facility]    Phone: [phone]   [facility])    Phone: [phone]  [facility]    Phone: [phone]  [facility]  Phone: [phone]  [facility]    Phone: [phone]  [facility]    Phone: [phone]  [facility]       Phone: [phone]   [facility]  Phone: [phone]  [facility]    Phone: [phone]  [facility]   Phone: [phone]  [facility]      Phone: [phone]  [facility]    Phone: [phone]

## 2017-03-22 NOTE — MAU Provider Note (Signed)
History     CSN: [id number]  Arrival date and time: 03/22/17 1243   First Provider Initiated Contact with Patient 03/22/17 1343      Chief Complaint  Patient presents with  . Abdominal Pain  . Vaginal Bleeding   HPI  Amy Downs is a 22 y.o. non pregnant female who presents with vaginal bleeding and abdominal pain. [facility] [place]. On 03/04/17 she had her mirena IUD removed after being in place for a year. Was started on ortho evra patch. States she placed the patch on 03/06/17 & it feel off before her week was up; Did not reapply patch & is currently not using any contraception. Reports vaginal bleeding started 4 days ago. Describes bleeding as lighter than menses but today passed some small blood clots. Lower abdominal pain x 2 days. Rates pain 5/10 & describes as cramp like. Has not treated symptoms. Did not call her ob/gyn about symptoms or issue with ortho evra.   Past Medical History:  Diagnosis Date  . Abscess   . Anxiety    no meds  . HSV-2 infection   . Medical history non-contributory     Past Surgical History:  Procedure Laterality Date  . CESAREAN SECTION     twin IUP with PreE  . WISDOM TOOTH EXTRACTION      Family History  Problem Relation Age of Onset  . Heart disease Father     Social History   Tobacco Use  . Smoking status: Never Smoker  . Smokeless tobacco: Never Used  Substance Use Topics  . Alcohol use: No  . Drug use: No    Allergies: No Known Allergies  Medications Prior to Admission  Medication Sig Dispense Refill Last Dose  . ibuprofen (ADVIL,MOTRIN) 200 MG tablet Take 400 mg by mouth every 6 (six) hours as needed for headache.   Past Month at Unknown time  . XULANE 150-35 MCG/24HR transdermal patch Place 1 patch onto the skin every 7 (seven) days.  0 Past Week at Unknown time  . butalbital-acetaminophen-caffeine (FIORICET, ESGIC) 50-325-40 MG tablet Take 1-2 tablets by mouth every 6 (six) hours as needed for  headache. (Patient not taking: Reported on 03/22/2017) 20 tablet 0 Not Taking at Unknown time  . ondansetron (ZOFRAN ODT) 4 MG disintegrating tablet Take 1 tablet (4 mg total) by mouth every 8 (eight) hours as needed for nausea or vomiting. (Patient not taking: Reported on 03/22/2017) 20 tablet 0 Not Taking at Unknown time  . valACYclovir (VALTREX) 1000 MG tablet Take 1,000 mg by mouth daily as needed (outbreaks).    PRN    Review of Systems  Constitutional: Negative.   Gastrointestinal: Positive for abdominal pain and nausea. Negative for constipation, diarrhea and vomiting.  Genitourinary: Positive for vaginal bleeding. Negative for dysuria and vaginal discharge.   Physical Exam   Blood pressure 117/79, pulse 74, temperature (!) 97.5 F (36.4 C), temperature source Oral, resp. rate 18, height 5\' 9"  (1.753 m), weight (!) 308 lb (139.7 kg), last menstrual period 03/18/2017, SpO2 100 %, unknown if currently breastfeeding.  Physical Exam  Nursing note and vitals reviewed. Constitutional: She is oriented to person, place, and time. She appears well-developed and well-nourished. No distress.  HENT:  Head: Normocephalic and atraumatic.  Eyes: Conjunctivae are normal. Right eye exhibits no discharge. Left eye exhibits no discharge. No scleral icterus.  Neck: Normal range of motion.  Cardiovascular: Normal rate, regular rhythm and normal heart sounds.  No murmur  heard. Respiratory: Effort normal and breath sounds normal. No respiratory distress. She has no wheezes.  GI: Soft. Bowel sounds are normal. There is no tenderness.  Genitourinary: Uterus normal. Cervix exhibits no motion tenderness and no friability. Right adnexum displays no mass and no tenderness. Left adnexum displays no mass and no tenderness. There is bleeding (small amount of dark red blood) in the vagina.  Neurological: She is alert and oriented to person, place, and time.  Skin: Skin is warm and dry. She is not diaphoretic.   Psychiatric: She has a normal mood and affect. Her behavior is normal. Judgment and thought content normal.    MAU Course  Procedures Results for orders placed or performed during the hospital encounter of 03/22/17 (from the past 24 hour(s))  Urinalysis, Routine w reflex microscopic     Status: Abnormal   Collection Time: 03/22/17 12:50 PM  Result Value Ref Range   Color, Urine YELLOW YELLOW   APPearance CLEAR CLEAR   Specific Gravity, Urine 1.027 1.005 - 1.030   pH 6.0 5.0 - 8.0   Glucose, UA NEGATIVE NEGATIVE mg/dL   Hgb urine dipstick LARGE (A) NEGATIVE   Bilirubin Urine NEGATIVE NEGATIVE   Ketones, ur NEGATIVE NEGATIVE mg/dL   Protein, ur NEGATIVE NEGATIVE mg/dL   Nitrite NEGATIVE NEGATIVE   Leukocytes, UA TRACE (A) NEGATIVE   RBC / HPF TOO NUMEROUS TO COUNT 0 - 5 RBC/hpf   WBC, UA 6-30 0 - 5 WBC/hpf   Bacteria, UA NONE SEEN NONE SEEN   Squamous Epithelial / LPF 0-5 (A) NONE SEEN   Mucus PRESENT   Pregnancy, urine POC     Status: None   Collection Time: 03/22/17  1:09 PM  Result Value Ref Range   Preg Test, Ur NEGATIVE NEGATIVE  CBC     Status: None   Collection Time: 03/22/17  1:55 PM  Result Value Ref Range   WBC 4.6 4.0 - 10.5 K/uL   RBC 4.30 3.87 - 5.11 MIL/uL   Hemoglobin 12.0 12.0 - 15.0 g/dL   HCT 57.8 46.9 - 62.9 %   MCV 85.3 78.0 - 100.0 fL   MCH 27.9 26.0 - 34.0 pg   MCHC 32.7 30.0 - 36.0 g/dL   RDW 52.8 41.3 - 24.4 %   Platelets 311 150 - 400 K/uL    MDM UPT negative Neg gc/ct & wet prep earlier this month, will not recollect. Reports no IC since that exam.  CBC -- hemoglobin stable & no leukocytosis Benign exam  Assessment and Plan  A: 1. Breakthrough bleeding on contraceptive patch   2. Lower abdominal pain    P; Discharge home Schedule f/u with ob/gyn to discuss contraception options Discussed reasons to return to MAU  Judeth Horn 03/22/2017, 1:43 PM

## 2017-11-07 ENCOUNTER — Emergency Department (HOSPITAL_COMMUNITY)
Admission: EM | Admit: 2017-11-07 | Discharge: 2017-11-07 | Disposition: A | Discharge status: Home / Self Care | Payer: Medicaid Other | Attending: Emergency Medicine | Admitting: Emergency Medicine

## 2017-11-07 ENCOUNTER — Other Ambulatory Visit: Payer: Self-pay

## 2017-11-07 ENCOUNTER — Emergency Department (HOSPITAL_COMMUNITY): Payer: Medicaid Other

## 2017-11-07 ENCOUNTER — Encounter (HOSPITAL_COMMUNITY): Payer: Self-pay

## 2017-11-07 DIAGNOSIS — G8929 Other chronic pain: Secondary | ICD-10-CM

## 2017-11-07 DIAGNOSIS — M5410 Radiculopathy, site unspecified: Secondary | ICD-10-CM

## 2017-11-07 DIAGNOSIS — M5412 Radiculopathy, cervical region: Secondary | ICD-10-CM | POA: Insufficient documentation

## 2017-11-07 DIAGNOSIS — Z79899 Other long term (current) drug therapy: Secondary | ICD-10-CM | POA: Insufficient documentation

## 2017-11-07 DIAGNOSIS — M25512 Pain in left shoulder: Secondary | ICD-10-CM

## 2017-11-07 LAB — BASIC METABOLIC PANEL
ANION GAP: 7 (ref 5–15)
BUN: 14 mg/dL (ref 6–20)
CO2: 28 mmol/L (ref 22–32)
Calcium: 9.3 mg/dL (ref 8.9–10.3)
Chloride: 107 mmol/L (ref 98–111)
Creatinine, Ser: 0.72 mg/dL (ref 0.44–1.00)
GFR calc Af Amer: >60 mL/min (ref >60)
Glucose, Bld: 110 mg/dL — ABNORMAL HIGH (ref 70–99)
Potassium: 3.9 mmol/L (ref 3.5–5.1)
SODIUM: 142 mmol/L (ref 135–145)

## 2017-11-07 LAB — CBC
HCT: 37.4 % (ref 36.0–46.0)
HEMOGLOBIN: 12.1 g/dL (ref 12.0–15.0)
MCH: 27.8 pg (ref 26.0–34.0)
MCHC: 32.4 g/dL (ref 30.0–36.0)
MCV: 86 fL (ref 78.0–100.0)
Platelets: 353 10*3/uL (ref 150–400)
RBC: 4.35 MIL/uL (ref 3.87–5.11)
RDW: 13.6 % (ref 11.5–15.5)
WBC: 5.9 10*3/uL (ref 4.0–10.5)

## 2017-11-07 LAB — POCT I-STAT TROPONIN I: Troponin i, poc: 0 ng/mL (ref 0.00–0.08)

## 2017-11-07 LAB — I-STAT BETA HCG BLOOD, ED (NOT ORDERABLE)

## 2017-11-07 MED ORDER — CYCLOBENZAPRINE HCL 5 MG PO TABS: 5.0000 mg | ORAL_TABLET | Freq: Three times a day (TID) | ORAL | 0 refills | Status: DC | PRN

## 2017-11-07 MED ORDER — IBUPROFEN 600 MG PO TABS: 600.0000 mg | ORAL_TABLET | Freq: Four times a day (QID) | ORAL | 0 refills | Status: DC | PRN

## 2017-11-07 NOTE — Discharge Instructions (Addendum)
Take the medicines prescribed for your pain and avoid movements that make it worse (heavy lifting, working overhead).  Apply a heating pad for 20 minutes to your neck and shoulder 3 times daily.  Dr. Magnus IvanBlackman is an orthopedic physician.

## 2017-11-07 NOTE — ED Provider Notes (Signed)
[facility]-EMERGENCY DEPT Provider Note   CSN: [id number] Arrival date & time: 11/07/17  1532     History   Chief Complaint Chief Complaint  Patient presents with  . Headache  . Shoulder Pain  . Chest Pain    HPI Amy Downs is a 22 y.o. female with a past medical history of anxiety and distant history of left shoulder injury presenting with left shoulder pain with radiation into her neck and posterior head, left upper back and into her anterior chest which is triggered by movement, especially with overhead use.  Describes her pain is sharp and intermittent.  She describes an injury to this left shoulder when she was a teenager at which time she was seen by orthopedics who recommended surgery but this was never completed, she is unsure of her diagnosis.  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  With certain movements her pain is severe enough to "take her breath".  She denies shortness of breath at baseline.  She has had no treatments prior to arrival for her symptoms.  She denies weakness or numbness in her upper extremities.  She does endorse occasional stabs of pain that radiated into her left hand.  The history is provided by the patient.    Past Medical History:  Diagnosis Date  . Abscess   . Anxiety    no meds  . HSV-2 infection   . Medical history non-contributory     Patient Active Problem List   Diagnosis Date Noted  . Decreased fetal movement     Past Surgical History:  Procedure Laterality Date  . CESAREAN SECTION     twin IUP with PreE  . WISDOM TOOTH EXTRACTION       OB History    Gravida  2   Para  2   Term  1   Preterm  1   AB      Living  3     SAB      TAB      Ectopic      Multiple  1   Live Births  3            Home Medications    Prior to Admission medications   Medication Sig Start  Date End Date Taking? Authorizing Provider  BIOTIN PO Take 1 tablet by mouth daily.   Yes [provider]  butalbital-acetaminophen-caffeine (FIORICET, ESGIC) 50-325-40 MG tablet Take 1-2 tablets by mouth every 6 (six) hours as needed for headache. Patient not taking: Reported on 03/22/2017 02/23/17 02/23/18  Degele, Kandra NicolasJulie P, MD  cyclobenzaprine (FLEXERIL) 5 MG tablet Take 1 tablet (5 mg total) by mouth 3 (three) times daily as needed for muscle spasms. 11/07/17   Burgess AmorIdol, Loralyn Rachel, PA-C  ibuprofen (ADVIL,MOTRIN) 600 MG tablet Take 1 tablet (600 mg total) by mouth every 6 (six) hours as needed. 11/07/17   Burgess AmorIdol, Atonya Templer, PA-C  ondansetron (ZOFRAN ODT) 4 MG disintegrating tablet Take 1 tablet (4 mg total) by mouth every 8 (eight) hours as needed for nausea or vomiting. Patient not taking: Reported on 03/22/2017 02/23/17   Degele, Kandra NicolasJulie P, MD    Family History Family History  Problem Relation Age of Onset  . Heart disease Father     Social History Social History   Tobacco Use  . Smoking status: Never Smoker  . Smokeless tobacco:  Never Used  Substance Use Topics  . Alcohol use: No  . Drug use: No     Allergies   Patient has no known allergies.   Review of Systems Review of Systems  Constitutional: Negative for fever.  HENT: Negative.   Cardiovascular: Negative.   Gastrointestinal: Negative.   Musculoskeletal: Positive for arthralgias and neck pain. Negative for joint swelling and myalgias.  Skin: Negative.   Neurological: Negative for weakness and numbness.     Physical Exam Updated Vital Signs BP 114/67 (BP Location: Right Arm)   Pulse 73   Temp 99 F (37.2 C) (Oral)   Resp 11   Ht 5\' 9"  (1.753 m)   LMP 10/21/2017   SpO2 100%   BMI 45.48 kg/m   Physical Exam  Constitutional: She appears well-developed and well-nourished.  HENT:  Head: Atraumatic.  Neck: Normal range of motion.  Pain across left shoulder and neck triggered by right head rotation.   Cardiovascular:    Pulses equal bilaterally  Musculoskeletal: She exhibits tenderness. She exhibits no edema.       Arms: Tender to palpation along left lateral cervical spine crossed her trapezius musculature.  Tenderness is also triggered with palpation in her left lateral upper chest wall.  There is some mild spasm noted across to her trapezius.  Neurological: She is alert. She has normal strength. She displays normal reflexes. No sensory deficit.  Equal grip strength  Skin: Skin is warm and dry.  Psychiatric: She has a normal mood and affect.  Vitals reviewed.    ED Treatments / Results  Labs (all labs ordered are listed, but only abnormal results are displayed) Labs Reviewed  BASIC METABOLIC PANEL - Abnormal; Notable for the following components:      Result Value   Glucose, Bld 110 (*)    All other components within normal limits  CBC  I-STAT TROPONIN, ED  I-STAT BETA HCG BLOOD, ED ([facility], [facility], [facility] ONLY)  POCT I-STAT TROPONIN I  I-STAT BETA HCG BLOOD, ED (NOT ORDERABLE)    EKG EKG Interpretation  Date/Time:  Friday November 07 2017 17:52:16 EDT Ventricular Rate:  84 PR Interval:    QRS Duration: 91 QT Interval:  362 QTC Calculation: 428 R Axis:   69 Text Interpretation:  Sinus rhythm No STEMI.  Confirmed by Alona Bene [phone]) on 11/08/2017 9:05:13 PM   Radiology Dg Chest 2 View  Result Date: 11/07/2017 CLINICAL DATA:  Chest pain EXAM: CHEST - 2 VIEW COMPARISON:  None. FINDINGS: Normal heart size and mediastinal contours. No acute infiltrate or edema. No effusion or pneumothorax. No acute osseous findings. IMPRESSION: Negative chest. Electronically Signed   By: Marnee Spring M.D.   On: 11/07/2017 16:16   Dg Cervical Spine Complete  Result Date: 11/07/2017 CLINICAL DATA:  Intermittent pain EXAM: CERVICAL SPINE - COMPLETE 4+ VIEW COMPARISON:  None. FINDINGS: Suboptimal visualization of cervicothoracic junction. Normal prevertebral soft tissue thickness. Dens and lateral masses are  within normal limits. IMPRESSION: Straightening of the cervical spine. Suboptimal visualization of cervicothoracic junction. No acute osseous abnormality. Electronically Signed   By: Jasmine Pang M.D.   On: 11/07/2017 20:02   Dg Shoulder Left  Result Date: 11/07/2017 CLINICAL DATA:  Intermittent left shoulder pain EXAM: LEFT SHOULDER - 2+ VIEW COMPARISON:  08/24/2011 FINDINGS: There is no evidence of fracture or dislocation. There is no evidence of arthropathy or other focal bone abnormality. Soft tissues are unremarkable. IMPRESSION: Negative. Electronically Signed   By: Adrian Prows.D.  On: 11/07/2017 19:58    Procedures Procedures (including critical care time)  Medications Ordered in ED Medications - No data to display   Initial Impression / Assessment and Plan / ED Course  I have reviewed the triage vital signs and the nursing notes.  Pertinent labs & imaging results that were available during my care of the patient were reviewed by me and considered in my medical decision making (see chart for details).     Labs and imaging reviewed, exam is consistent with musculoskeletal strain along with suspected muscle spasm.  Pain is reproducible.  She was placed on ibuprofen and Flexeril.  We discussed heat therapy, activity as tolerated.  She was given a work note for reduced lifting for the next week.  Advised to follow-up with her PCP for recheck in 1 week if not improving.  Her labs and EKG, chest x-ray negative for any evidence for cardiopulmonary source of symptoms.  Doubt PE, vital signs are normal.  Final Clinical Impressions(s) / ED Diagnoses   Final diagnoses:  Radiculopathy affecting upper extremity  Chronic left shoulder pain    ED Discharge Orders         Ordered    ibuprofen (ADVIL,MOTRIN) 600 MG tablet  Every 6 hours PRN     11/07/17 2036    cyclobenzaprine (FLEXERIL) 5 MG tablet  3 times daily PRN     11/07/17 2036           Burgess Amor, PA-C 11/09/17  0142    Rolan Bucco, MD 11/09/17 1457

## 2017-11-07 NOTE — ED Triage Notes (Signed)
Patient c/o intermittent left shoulder, left chest, and headache x 2-3 days. Patient states CP causes her to be SOB.

## 2017-12-16 ENCOUNTER — Encounter (HOSPITAL_COMMUNITY): Payer: Self-pay | Admitting: *Deleted

## 2017-12-16 ENCOUNTER — Emergency Department (HOSPITAL_COMMUNITY)
Admission: EM | Admit: 2017-12-16 | Discharge: 2017-12-16 | Disposition: A | Discharge status: Home / Self Care | Payer: Medicaid Other | Attending: Emergency Medicine | Admitting: Emergency Medicine

## 2017-12-16 DIAGNOSIS — R112 Nausea with vomiting, unspecified: Secondary | ICD-10-CM | POA: Insufficient documentation

## 2017-12-16 DIAGNOSIS — Z79899 Other long term (current) drug therapy: Secondary | ICD-10-CM | POA: Insufficient documentation

## 2017-12-16 LAB — CBC
HEMATOCRIT: 37.7 % (ref 36.0–46.0)
HEMOGLOBIN: 11.7 g/dL — AB (ref 12.0–15.0)
MCH: 27.2 pg (ref 26.0–34.0)
MCHC: 31 g/dL (ref 30.0–36.0)
MCV: 87.7 fL (ref 80.0–100.0)
Platelets: 321 10*3/uL (ref 150–400)
RBC: 4.3 MIL/uL (ref 3.87–5.11)
RDW: 13.2 % (ref 11.5–15.5)
WBC: 4.1 10*3/uL (ref 4.0–10.5)
nRBC: 0 % (ref 0.0–0.2)

## 2017-12-16 LAB — URINALYSIS, ROUTINE W REFLEX MICROSCOPIC
BACTERIA UA: NONE SEEN
BILIRUBIN URINE: NEGATIVE
Glucose, UA: NEGATIVE mg/dL
Ketones, ur: 5 mg/dL — AB
Nitrite: NEGATIVE
Protein, ur: NEGATIVE mg/dL
Specific Gravity, Urine: 1.013 (ref 1.005–1.030)
pH: 7 (ref 5.0–8.0)

## 2017-12-16 LAB — I-STAT BETA HCG BLOOD, ED (MC, WL, AP ONLY)

## 2017-12-16 LAB — COMPREHENSIVE METABOLIC PANEL
ALBUMIN: 3.3 g/dL — AB (ref 3.5–5.0)
ALT: 11 U/L (ref 0–44)
AST: 19 U/L (ref 15–41)
Alkaline Phosphatase: 43 U/L (ref 38–126)
Anion gap: 6 (ref 5–15)
BUN: 7 mg/dL (ref 6–20)
CHLORIDE: 108 mmol/L (ref 98–111)
CO2: 24 mmol/L (ref 22–32)
CREATININE: 0.94 mg/dL (ref 0.44–1.00)
Calcium: 8.8 mg/dL — ABNORMAL LOW (ref 8.9–10.3)
GFR calc Af Amer: >60 mL/min (ref >60)
GFR calc non Af Amer: >60 mL/min (ref >60)
GLUCOSE: 105 mg/dL — AB (ref 70–99)
POTASSIUM: 3.7 mmol/L (ref 3.5–5.1)
Sodium: 138 mmol/L (ref 135–145)
Total Bilirubin: 0.6 mg/dL (ref 0.3–1.2)
Total Protein: 7.2 g/dL (ref 6.5–8.1)

## 2017-12-16 MED ORDER — ONDANSETRON 4 MG PO TBDP
8.0000 mg | ORAL_TABLET | Freq: Once | ORAL | Status: AC
  Administered 2017-12-16: 8 mg via ORAL
  Filled 2017-12-16: qty 2

## 2017-12-16 MED ORDER — ONDANSETRON 4 MG PO TBDP: 4.0000 mg | ORAL_TABLET | Freq: Three times a day (TID) | ORAL | 0 refills | Status: DC | PRN

## 2017-12-16 MED ORDER — LORAZEPAM 1 MG PO TABS
1.0000 mg | ORAL_TABLET | Freq: Once | ORAL | Status: AC
  Administered 2017-12-16: 1 mg via ORAL
  Filled 2017-12-16: qty 1

## 2017-12-16 MED ORDER — SODIUM CHLORIDE 0.9 % IV BOLUS
1000.0000 mL | Freq: Once | INTRAVENOUS | Status: AC
  Administered 2017-12-16: 1000 mL via INTRAVENOUS

## 2017-12-16 MED ORDER — METOCLOPRAMIDE HCL 5 MG/ML IJ SOLN
10.0000 mg | Freq: Once | INTRAMUSCULAR | Status: AC
  Administered 2017-12-16: 10 mg via INTRAVENOUS
  Filled 2017-12-16: qty 2

## 2017-12-16 NOTE — ED Provider Notes (Signed)
[facility] EMERGENCY DEPARTMENT Provider Note   CSN: [id number] Arrival date & time: 12/16/17  4098     History   Chief Complaint Chief Complaint  Patient presents with  . Emesis    HPI Koral Thaden is a 22 y.o. female who presents with nausea and vomiting. PMH significant for anxiety, herpes. She states she is very anxious because she is in the hallway. She states that she ate [employer] last night. This morning around 3AM she had multiple episodes of vomiting. It was non-bloody non-bilious. No one else who ate taco bell got sick. She had 2 normal BMs this morning and denies diarrhea. No fever, chills, abdominal pain, urinary symptoms, vaginal discharge. She had a normal period last week. Past surgical hx significant for C-section.  HPI  Past Medical History:  Diagnosis Date  . Abscess   . Anxiety    no meds  . HSV-2 infection   . Medical history non-contributory     Patient Active Problem List   Diagnosis Date Noted  . Decreased fetal movement     Past Surgical History:  Procedure Laterality Date  . CESAREAN SECTION     twin IUP with PreE  . WISDOM TOOTH EXTRACTION       OB History    Gravida  2   Para  2   Term  1   Preterm  1   AB      Living  3     SAB      TAB      Ectopic      Multiple  1   Live Births  3            Home Medications    Prior to Admission medications   Medication Sig Start Date End Date Taking? Authorizing Provider  BIOTIN PO Take 1 tablet by mouth daily.    [provider]  butalbital-acetaminophen-caffeine (FIORICET, ESGIC) 50-325-40 MG tablet Take 1-2 tablets by mouth every 6 (six) hours as needed for headache. Patient not taking: Reported on 03/22/2017 02/23/17 02/23/18  Degele, Kandra Nicolas, MD  cyclobenzaprine (FLEXERIL) 5 MG tablet Take 1 tablet (5 mg total) by mouth 3 (three) times daily as needed for muscle spasms. 11/07/17   Burgess Amor, PA-C  ibuprofen (ADVIL,MOTRIN) 600 MG tablet Take 1  tablet (600 mg total) by mouth every 6 (six) hours as needed. 11/07/17   Burgess Amor, PA-C  ondansetron (ZOFRAN ODT) 4 MG disintegrating tablet Take 1 tablet (4 mg total) by mouth every 8 (eight) hours as needed for nausea or vomiting. Patient not taking: Reported on 03/22/2017 02/23/17   Degele, Kandra Nicolas, MD    Family History Family History  Problem Relation Age of Onset  . Heart disease Father     Social History Social History   Tobacco Use  . Smoking status: Never Smoker  . Smokeless tobacco: Never Used  Substance Use Topics  . Alcohol use: No  . Drug use: No     Allergies   Patient has no known allergies.   Review of Systems Review of Systems  Constitutional: Negative for chills and fever.  Respiratory: Negative for shortness of breath.   Cardiovascular: Negative for chest pain.  Gastrointestinal: Positive for nausea and vomiting. Negative for abdominal pain, constipation and diarrhea.  Genitourinary: Negative for dysuria, menstrual problem, vaginal bleeding and vaginal discharge.  Psychiatric/Behavioral: The patient is nervous/anxious.   All other systems reviewed and are negative.    Physical Exam  Updated Vital Signs BP 111/65 (BP Location: Left Arm)   Pulse 71   Temp 98.4 F (36.9 C) (Oral)   Resp 17   Ht 5\' 9"  (1.753 m)   Wt 122.5 kg   SpO2 100%   BMI 39.87 kg/m   Physical Exam  Constitutional: She is oriented to person, place, and time. She appears well-developed and well-nourished. She appears distressed.  Obese, crying, shaking  HENT:  Head: Normocephalic and atraumatic.  Eyes: Pupils are equal, round, and reactive to light. Conjunctivae are normal. Right eye exhibits no discharge. Left eye exhibits no discharge. No scleral icterus.  Neck: Normal range of motion.  Cardiovascular: Normal rate and regular rhythm.  Pulmonary/Chest: Effort normal and breath sounds normal. No respiratory distress.  Abdominal: Soft. Bowel sounds are normal. She exhibits no  distension. There is no tenderness.  Neurological: She is alert and oriented to person, place, and time.  Skin: Skin is warm and dry.  Psychiatric: She has a normal mood and affect. Her behavior is normal.  Nursing note and vitals reviewed.    ED Treatments / Results  Labs (all labs ordered are listed, but only abnormal results are displayed) Labs Reviewed  COMPREHENSIVE METABOLIC PANEL - Abnormal; Notable for the following components:      Result Value   Glucose, Bld 105 (*)    Calcium 8.8 (*)    Albumin 3.3 (*)    All other components within normal limits  CBC - Abnormal; Notable for the following components:   Hemoglobin 11.7 (*)    All other components within normal limits  URINALYSIS, ROUTINE W REFLEX MICROSCOPIC - Abnormal; Notable for the following components:   Hgb urine dipstick SMALL (*)    Ketones, ur 5 (*)    Leukocytes, UA SMALL (*)    All other components within normal limits  I-STAT BETA HCG BLOOD, ED ([facility], [facility], [facility] ONLY)    EKG None  Radiology No results found.  Procedures Procedures (including critical care time)  Medications Ordered in ED Medications  ondansetron (ZOFRAN-ODT) disintegrating tablet 8 mg (8 mg Oral Given 12/16/17 0952)  LORazepam (ATIVAN) tablet 1 mg (1 mg Oral Given 12/16/17 0952)  sodium chloride 0.9 % bolus 1,000 mL (0 mLs Intravenous Stopped 12/16/17 1229)  metoCLOPramide (REGLAN) injection 10 mg (10 mg Intravenous Given 12/16/17 1355)     Initial Impression / Assessment and Plan / ED Course  I have reviewed the triage vital signs and the nursing notes.  Pertinent labs & imaging results that were available during my care of the patient were reviewed by me and considered in my medical decision making (see chart for details).  22 year old female presents with acute onset of N/V. Vitals are normal. She denies abdominal pain just states that she feels the nausea in her abdomen. She is not tender on exam. Will give fluids and zofran.  She is very anxious as well from being in the hallway. Ativan was given.  On recheck she is feeling better, will PO challenge. Labs and UA are normal. Preg test is negative.  2nd recheck she is feeling nauseous. Will order Reglan  3rd recheck, she feels better. Abdomen is soft, benign. Unclear if symptoms are related to t[employer] as she didn't have any diarrhea. Will d/c with strict return precautions.   Final Clinical Impressions(s) / ED Diagnoses   Final diagnoses:  Non-intractable vomiting with nausea, unspecified vomiting type    ED Discharge Orders    None  Bethel Born, PA-C 12/16/17 1528    Pricilla Loveless, MD 12/17/17 [phone]

## 2017-12-16 NOTE — Discharge Instructions (Signed)
Please take Zofran for nausea Rest and drink fluids Please return if you are worsening (fever, uncontrollable vomiting, etc)

## 2017-12-16 NOTE — ED Notes (Signed)
Pt tolerated gingerale and crackers well, no emesis

## 2017-12-16 NOTE — ED Triage Notes (Signed)
To ED for eval of vomiting since this am. States she ate t[employer] around 9pm and started vomiting around 5am. No diarrhea. Denies abd pain.

## 2017-12-16 NOTE — ED Notes (Signed)
Pt given ginger ale and saltine crackers.  

## 2018-02-06 ENCOUNTER — Emergency Department (HOSPITAL_COMMUNITY): Payer: Medicaid Other

## 2018-02-06 ENCOUNTER — Encounter (HOSPITAL_COMMUNITY): Payer: Self-pay | Admitting: Internal Medicine

## 2018-02-06 ENCOUNTER — Emergency Department (HOSPITAL_COMMUNITY)
Admission: EM | Admit: 2018-02-06 | Discharge: 2018-02-06 | Disposition: A | Discharge status: Home / Self Care | Payer: Medicaid Other | Attending: Emergency Medicine | Admitting: Emergency Medicine

## 2018-02-06 DIAGNOSIS — J101 Influenza due to other identified influenza virus with other respiratory manifestations: Secondary | ICD-10-CM | POA: Insufficient documentation

## 2018-02-06 DIAGNOSIS — J111 Influenza due to unidentified influenza virus with other respiratory manifestations: Secondary | ICD-10-CM

## 2018-02-06 DIAGNOSIS — R69 Illness, unspecified: Secondary | ICD-10-CM

## 2018-02-06 DIAGNOSIS — Z79899 Other long term (current) drug therapy: Secondary | ICD-10-CM | POA: Insufficient documentation

## 2018-02-06 MED ORDER — ONDANSETRON HCL 4 MG/2ML IJ SOLN
4.0000 mg | Freq: Once | INTRAMUSCULAR | Status: AC
  Administered 2018-02-06: 4 mg via INTRAVENOUS
  Filled 2018-02-06: qty 2

## 2018-02-06 MED ORDER — ACETAMINOPHEN 500 MG PO TABS
1000.0000 mg | ORAL_TABLET | Freq: Once | ORAL | Status: AC
  Administered 2018-02-06: 1000 mg via ORAL
  Filled 2018-02-06: qty 2

## 2018-02-06 MED ORDER — KETOROLAC TROMETHAMINE 30 MG/ML IJ SOLN
30.0000 mg | Freq: Once | INTRAMUSCULAR | Status: AC
  Administered 2018-02-06: 30 mg via INTRAVENOUS
  Filled 2018-02-06: qty 1

## 2018-02-06 MED ORDER — SODIUM CHLORIDE 0.9 % IV BOLUS
1000.0000 mL | Freq: Once | INTRAVENOUS | Status: AC
  Administered 2018-02-06: 1000 mL via INTRAVENOUS

## 2018-02-06 NOTE — ED Provider Notes (Signed)
[facility] Provider Note   CSN: [id number] Arrival date & time: 02/06/18  0900     History   Chief Complaint Chief Complaint  Patient presents with  . Cough  . Emesis    HPI Amy Downs is a 22 y.o. female.  HPI [age] year old female presents the emergency department with headache myalgias sore throat cough and nausea and posttussive vomiting over the past 3 days.  She is had fever at home with a temperature at home documented at 102.  Reports decreased oral intake.  She feels weak and poorly at this time.  She is 22 years old and otherwise healthy without significant past medical history.  She tried Robitussin and cough drops at home without improvement in her symptoms.  She has not received her influenza vaccine this year.  No shortness of breath.   Past Medical History:  Diagnosis Date  . Abscess   . Anxiety    no meds  . HSV-2 infection   . Medical history non-contributory     Patient Active Problem List   Diagnosis Date Noted  . Decreased fetal movement     Past Surgical History:  Procedure Laterality Date  . CESAREAN SECTION     twin IUP with PreE  . WISDOM TOOTH EXTRACTION       OB History    Gravida  2   Para  2   Term  1   Preterm  1   AB      Living  3     SAB      TAB      Ectopic      Multiple  1   Live Births  3            Home Medications    Prior to Admission medications   Medication Sig Start Date End Date Taking? Authorizing Provider  BIOTIN PO Take 1 tablet by mouth daily.    [provider]  butalbital-acetaminophen-caffeine (FIORICET, ESGIC) 50-325-40 MG tablet Take 1-2 tablets by mouth every 6 (six) hours as needed for headache. Patient not taking: Reported on 03/22/2017 02/23/17 02/23/18  Degele, Kandra NicolasJulie P, MD  cyclobenzaprine (FLEXERIL) 5 MG tablet Take 1 tablet (5 mg total) by mouth 3 (three) times daily as needed for muscle spasms. 11/07/17   Burgess AmorIdol, Julie, PA-C  ibuprofen  (ADVIL,MOTRIN) 600 MG tablet Take 1 tablet (600 mg total) by mouth every 6 (six) hours as needed. 11/07/17   Burgess AmorIdol, Julie, PA-C  ondansetron (ZOFRAN ODT) 4 MG disintegrating tablet Take 1 tablet (4 mg total) by mouth every 8 (eight) hours as needed for nausea or vomiting. 12/16/17   Bethel BornGekas, Kelly Marie, PA-C    Family History Family History  Problem Relation Age of Onset  . Heart disease Father     Social History Social History   Tobacco Use  . Smoking status: Never Smoker  . Smokeless tobacco: Never Used  Substance Use Topics  . Alcohol use: No  . Drug use: No     Allergies   Patient has no known allergies.   Review of Systems Review of Systems  All other systems reviewed and are negative.    Physical Exam Updated Vital Signs BP (!) 125/106   Pulse 100   Temp 99.2 F (37.3 C) (Oral)   Resp 20   Ht 5\' 9"  (1.753 m)   Wt 122.5 kg   LMP 01/12/2018   SpO2 100%   BMI 39.87 kg/m  Physical Exam Vitals signs and nursing note reviewed.  Constitutional:      General: She is not in acute distress.    Appearance: She is well-developed.  HENT:     Head: Normocephalic and atraumatic.     Right Ear: Tympanic membrane normal.     Left Ear: Tympanic membrane normal.     Mouth/Throat:     Pharynx: No oropharyngeal exudate or posterior oropharyngeal erythema.  Neck:     Musculoskeletal: Normal range of motion.  Cardiovascular:     Rate and Rhythm: Regular rhythm. Tachycardia present.     Heart sounds: Normal heart sounds.  Pulmonary:     Effort: Pulmonary effort is normal.     Breath sounds: Normal breath sounds.  Abdominal:     General: There is no distension.     Palpations: Abdomen is soft.     Tenderness: There is no abdominal tenderness.  Musculoskeletal: Normal range of motion.  Skin:    General: Skin is warm and dry.  Neurological:     Mental Status: She is alert and oriented to person, place, and time.  Psychiatric:        Judgment: Judgment normal.       ED Treatments / Results  Labs (all labs ordered are listed, but only abnormal results are displayed) Labs Reviewed - No data to display  EKG None  Radiology Dg Chest 2 View  Result Date: 02/06/2018 CLINICAL DATA:  22 year old female with cough fever and headache for 3 days. EXAM: CHEST - 2 VIEW COMPARISON:  11/07/2017. FINDINGS: Large body habitus. The heart size and mediastinal contours are within normal limits. Both lungs are clear. Visualized tracheal air column is within normal limits. The visualized skeletal structures are unremarkable. Negative visible bowel gas pattern. IMPRESSION: No cardiopulmonary abnormality. Electronically Signed   By: Odessa FlemingH  Hall M.D.   On: 02/06/2018 09:38    Procedures Procedures (including critical care time)  Medications Ordered in ED Medications  ketorolac (TORADOL) 30 MG/ML injection 30 mg (30 mg Intravenous Given 02/06/18 0943)  acetaminophen (TYLENOL) tablet 1,000 mg (1,000 mg Oral Given 02/06/18 0943)  sodium chloride 0.9 % bolus 1,000 mL (0 mLs Intravenous Stopped 02/06/18 1046)  ondansetron (ZOFRAN) injection 4 mg (4 mg Intravenous Given 02/06/18 1014)     Initial Impression / Assessment and Plan / ED Course  I have reviewed the triage vital signs and the nursing notes.  Pertinent labs & imaging results that were available during my care of the patient were reviewed by me and considered in my medical decision making (see chart for details).     Feels much better after hydration in the emergency department.  Likely influenza-like illness.  No indication for Tamiflu.  Chest x-ray without infiltrate.  Vital signs have normalized.  Feels much better.  Close PCP follow-up.  Instructed to return to the emergency department for new or worsening symptoms.  Work note given.  Final Clinical Impressions(s) / ED Diagnoses   Final diagnoses:  Influenza-like illness    ED Discharge Orders    None       Azalia Bilisampos, Reisha Wos, MD 02/06/18  1102

## 2018-02-06 NOTE — ED Triage Notes (Signed)
Pt here with flu like symptoms that started 3 days ago. Symptoms began with sore throat, then progressed to cough, headache, nausea/vomiting and fever. Febrile at home with temp of 102. Pt reports taking robitussin and cough drops for symptom relief at home. Did not receive flu shot this year.

## 2018-02-06 NOTE — Discharge Instructions (Addendum)
Take ibuprofen and Tylenol for fever and symptom control  Drink lots of water to keep yourself hydrated.

## 2018-02-06 NOTE — ED Notes (Signed)
Patient's temperature improved with tylenol administration.

## 2018-02-06 NOTE — ED Notes (Signed)
ED Provider at bedside. 

## 2018-03-06 ENCOUNTER — Other Ambulatory Visit: Payer: Self-pay

## 2018-03-06 ENCOUNTER — Inpatient Hospital Stay (HOSPITAL_COMMUNITY)
Admission: AD | Admit: 2018-03-06 | Discharge: 2018-03-06 | Disposition: A | Discharge status: Home / Self Care | Payer: Medicaid Other | Attending: Obstetrics & Gynecology | Admitting: Obstetrics & Gynecology

## 2018-03-06 ENCOUNTER — Encounter (HOSPITAL_COMMUNITY): Payer: Self-pay | Admitting: *Deleted

## 2018-03-06 DIAGNOSIS — F17210 Nicotine dependence, cigarettes, uncomplicated: Secondary | ICD-10-CM | POA: Insufficient documentation

## 2018-03-06 DIAGNOSIS — Z202 Contact with and (suspected) exposure to infections with a predominantly sexual mode of transmission: Secondary | ICD-10-CM

## 2018-03-06 DIAGNOSIS — Z113 Encounter for screening for infections with a predominantly sexual mode of transmission: Secondary | ICD-10-CM

## 2018-03-06 DIAGNOSIS — Z3202 Encounter for pregnancy test, result negative: Secondary | ICD-10-CM

## 2018-03-06 LAB — WET PREP, GENITAL
Sperm: NONE SEEN
Trich, Wet Prep: NONE SEEN
Yeast Wet Prep HPF POC: NONE SEEN

## 2018-03-06 LAB — URINALYSIS, ROUTINE W REFLEX MICROSCOPIC
BACTERIA UA: NONE SEEN
Bilirubin Urine: NEGATIVE
GLUCOSE, UA: NEGATIVE mg/dL
Ketones, ur: NEGATIVE mg/dL
Nitrite: NEGATIVE
PH: 5 (ref 5.0–8.0)
PROTEIN: NEGATIVE mg/dL
Specific Gravity, Urine: 1.026 (ref 1.005–1.030)

## 2018-03-06 LAB — POCT PREGNANCY, URINE: Preg Test, Ur: NEGATIVE

## 2018-03-06 MED ORDER — CEFTRIAXONE SODIUM 250 MG IJ SOLR
250.0000 mg | Freq: Once | INTRAMUSCULAR | Status: AC
  Administered 2018-03-06: 250 mg via INTRAMUSCULAR
  Filled 2018-03-06: qty 250

## 2018-03-06 MED ORDER — AZITHROMYCIN 250 MG PO TABS
1000.0000 mg | ORAL_TABLET | Freq: Once | ORAL | Status: AC
  Administered 2018-03-06: 1000 mg via ORAL
  Filled 2018-03-06: qty 4

## 2018-03-06 NOTE — MAU Note (Signed)
Blind vaginal swabs done.

## 2018-03-06 NOTE — MAU Note (Signed)
Found  Out her boyfriend cheated on her and that he has chlamydia.  So wanting to be tested and to find out if she is preg. Has been cramping and nauseated

## 2018-03-06 NOTE — Discharge Instructions (Signed)

## 2018-03-06 NOTE — MAU Provider Note (Signed)
Chief Complaint: Exposure to STD; Abdominal Pain; and Nausea   First Provider Initiated Contact with Patient 03/06/18 1448     SUBJECTIVE HPI: Amy Downs is a 23 y.o. non pregnant female who presents to Maternity Admissions reporting STD exposure. Was told by her boyfriend that he tested positive for chlamydia. Patient would like STD testing. Also requesting pregnancy test d/t nausea and breast tenderness. LMP 02/08/18. No contraception.  Denies abdominal pain, vaginal discharge, vaginal sores.    Past Medical History:  Diagnosis Date  . Abscess   . Anxiety    no meds  . HSV-2 infection   . Medical history non-contributory    OB History  Gravida Para Term Preterm AB Living  2 2 1 1   3   SAB TAB Ectopic Multiple Live Births        1 3    # Outcome Date GA Lbr Len/2nd Weight Sex Delivery Anes PTL Lv  2A Preterm  6561w0d    CS-LTranv   LIV  2B Preterm      CS-LTranv   LIV  1 Term  6956w0d    VBAC   LIV   Past Surgical History:  Procedure Laterality Date  . CESAREAN SECTION     twin IUP with PreE  . WISDOM TOOTH EXTRACTION     Social History   Socioeconomic History  . Marital status: Single    Spouse name: Not on file  . Number of children: Not on file  . Years of education: Not on file  . Highest education level: Not on file  Occupational History  . Not on file  Social Needs  . Financial resource strain: Not on file  . Food insecurity:    Worry: Not on file    Inability: Not on file  . Transportation needs:    Medical: Not on file    Non-medical: Not on file  Tobacco Use  . Smoking status: Current Every Day Smoker    Packs/day: 0.25  . Smokeless tobacco: Never Used  Substance and Sexual Activity  . Alcohol use: No  . Drug use: No  . Sexual activity: Yes    Birth control/protection: None  Lifestyle  . Physical activity:    Days per week: Not on file    Minutes per session: Not on file  . Stress: Not on file  Relationships  . Social connections:    Talks  on phone: Not on file    Gets together: Not on file    Attends religious service: Not on file    Active member of club or organization: Not on file    Attends meetings of clubs or organizations: Not on file    Relationship status: Not on file  . Intimate partner violence:    Fear of current or ex partner: Not on file    Emotionally abused: Not on file    Physically abused: Not on file    Forced sexual activity: Not on file  Other Topics Concern  . Not on file  Social History Narrative  . Not on file   Family History  Problem Relation Age of Onset  . Heart disease Father    No current facility-administered medications on file prior to encounter.    Current Outpatient Medications on File Prior to Encounter  Medication Sig Dispense Refill  . BIOTIN PO Take 1 tablet by mouth daily.    . cyclobenzaprine (FLEXERIL) 5 MG tablet Take 1 tablet (5 mg total) by mouth 3 (three)  times daily as needed for muscle spasms. 15 tablet 0  . ibuprofen (ADVIL,MOTRIN) 600 MG tablet Take 1 tablet (600 mg total) by mouth every 6 (six) hours as needed. 30 tablet 0  . ondansetron (ZOFRAN ODT) 4 MG disintegrating tablet Take 1 tablet (4 mg total) by mouth every 8 (eight) hours as needed for nausea or vomiting. 8 tablet 0   No Known Allergies  I have reviewed patient's Past Medical Hx, Surgical Hx, Family Hx, Social Hx, medications and allergies.   Review of Systems  Constitutional: Negative.   Gastrointestinal: Positive for nausea. Negative for abdominal pain and vomiting.  Genitourinary: Negative.     OBJECTIVE Patient Vitals for the past 24 hrs:  BP Temp Temp src Pulse Resp SpO2 Weight  03/06/18 1605 120/70 - - 85 18 - -  03/06/18 1226 140/77 98.2 F (36.8 C) Oral 69 17 100 % 124.6 kg   Constitutional: Well-developed, well-nourished female in no acute distress.  Cardiovascular: normal rate & rhythm, no murmur Respiratory: normal rate and effort. Lung sounds clear throughout GI: Abd soft,  non-tender, Pos BS x 4. No guarding or rebound tenderness MS: Extremities nontender, no edema, normal ROM Neurologic: Alert and oriented x 4.    LAB RESULTS Results for orders placed or performed during the hospital encounter of 03/06/18 (from the past 24 hour(s))  Urinalysis, Routine w reflex microscopic     Status: Abnormal   Collection Time: 03/06/18 12:36 PM  Result Value Ref Range   Color, Urine YELLOW YELLOW   APPearance CLEAR CLEAR   Specific Gravity, Urine 1.026 1.005 - 1.030   pH 5.0 5.0 - 8.0   Glucose, UA NEGATIVE NEGATIVE mg/dL   Hgb urine dipstick SMALL (A) NEGATIVE   Bilirubin Urine NEGATIVE NEGATIVE   Ketones, ur NEGATIVE NEGATIVE mg/dL   Protein, ur NEGATIVE NEGATIVE mg/dL   Nitrite NEGATIVE NEGATIVE   Leukocytes, UA SMALL (A) NEGATIVE   RBC / HPF 6-10 0 - 5 RBC/hpf   WBC, UA 11-20 0 - 5 WBC/hpf   Bacteria, UA NONE SEEN NONE SEEN   Squamous Epithelial / LPF 0-5 0 - 5   Mucus PRESENT   Pregnancy, urine POC     Status: None   Collection Time: 03/06/18 12:38 PM  Result Value Ref Range   Preg Test, Ur NEGATIVE NEGATIVE  Wet prep, genital     Status: Abnormal   Collection Time: 03/06/18  3:08 PM  Result Value Ref Range   Yeast Wet Prep HPF POC NONE SEEN NONE SEEN   Trich, Wet Prep NONE SEEN NONE SEEN   Clue Cells Wet Prep HPF POC PRESENT (A) NONE SEEN   WBC, Wet Prep HPF POC FEW (A) NONE SEEN   Sperm NONE SEEN     IMAGING No results found.  MAU COURSE Orders Placed This Encounter  Procedures  . Wet prep, genital  . Urinalysis, Routine w reflex microscopic  . RPR  . HIV Antibody (routine testing w rflx)  . Hepatitis B surface antigen  . Hepatitis C antibody  . Pregnancy, urine POC  . Discharge patient   Meds ordered this encounter  Medications  . azithromycin (ZITHROMAX) tablet 1,000 mg  . cefTRIAXone (ROCEPHIN) injection 250 mg    Order Specific Question:   Antibiotic Indication:    Answer:   STD    MDM UPT negative STD testing per patient  request Rocephin & azithromycin given in MAU for chlamydia tx & possible GC exposure.   ASSESSMENT 1. STD  exposure   2. Screen for STD (sexually transmitted disease)   3. Negative pregnancy test     PLAN Discharge home in stable condition. GC/CT, HIV, RPR, HBV, HCV pending No intercourse x 2 weeks  Allergies as of 03/06/2018   No Known Allergies     Medication List    TAKE these medications   BIOTIN PO Take 1 tablet by mouth daily.   cyclobenzaprine 5 MG tablet Commonly known as:  FLEXERIL Take 1 tablet (5 mg total) by mouth 3 (three) times daily as needed for muscle spasms.   ibuprofen 600 MG tablet Commonly known as:  ADVIL,MOTRIN Take 1 tablet (600 mg total) by mouth every 6 (six) hours as needed.   ondansetron 4 MG disintegrating tablet Commonly known as:  ZOFRAN ODT Take 1 tablet (4 mg total) by mouth every 8 (eight) hours as needed for nausea or vomiting.        Judeth HornLawrence, Takisha Pelle, NP 03/06/2018  5:54 PM

## 2018-03-07 LAB — HEPATITIS C ANTIBODY

## 2018-03-07 LAB — RPR: RPR: NONREACTIVE

## 2018-03-07 LAB — HEPATITIS B SURFACE ANTIGEN: Hepatitis B Surface Ag: NEGATIVE

## 2018-03-07 LAB — HIV ANTIBODY (ROUTINE TESTING W REFLEX): HIV Screen 4th Generation wRfx: NONREACTIVE

## 2018-03-09 LAB — GC/CHLAMYDIA PROBE AMP (~~LOC~~) NOT AT ARMC
Chlamydia: POSITIVE — AB
Neisseria Gonorrhea: NEGATIVE

## 2018-04-13 ENCOUNTER — Emergency Department (HOSPITAL_COMMUNITY)
Admission: EM | Admit: 2018-04-13 | Discharge: 2018-04-13 | Disposition: A | Discharge status: Home / Self Care | Payer: Medicaid Other | Attending: Emergency Medicine | Admitting: Emergency Medicine

## 2018-04-13 ENCOUNTER — Encounter (HOSPITAL_COMMUNITY): Payer: Self-pay | Admitting: Emergency Medicine

## 2018-04-13 DIAGNOSIS — A599 Trichomoniasis, unspecified: Secondary | ICD-10-CM | POA: Insufficient documentation

## 2018-04-13 DIAGNOSIS — M25512 Pain in left shoulder: Secondary | ICD-10-CM | POA: Insufficient documentation

## 2018-04-13 DIAGNOSIS — G8929 Other chronic pain: Secondary | ICD-10-CM

## 2018-04-13 DIAGNOSIS — F17210 Nicotine dependence, cigarettes, uncomplicated: Secondary | ICD-10-CM | POA: Insufficient documentation

## 2018-04-13 DIAGNOSIS — Z79899 Other long term (current) drug therapy: Secondary | ICD-10-CM | POA: Insufficient documentation

## 2018-04-13 DIAGNOSIS — B373 Candidiasis of vulva and vagina: Secondary | ICD-10-CM | POA: Insufficient documentation

## 2018-04-13 DIAGNOSIS — B379 Candidiasis, unspecified: Secondary | ICD-10-CM

## 2018-04-13 LAB — WET PREP, GENITAL: Sperm: NONE SEEN

## 2018-04-13 MED ORDER — LIDOCAINE HCL (PF) 1 % IJ SOLN
INTRAMUSCULAR | Status: AC
  Filled 2018-04-13: qty 5

## 2018-04-13 MED ORDER — METRONIDAZOLE 500 MG PO TABS
2000.0000 mg | ORAL_TABLET | Freq: Once | ORAL | Status: AC
  Administered 2018-04-13: 2000 mg via ORAL
  Filled 2018-04-13: qty 4

## 2018-04-13 MED ORDER — FLUCONAZOLE 150 MG PO TABS: 150.0000 mg | ORAL_TABLET | Freq: Every day | ORAL | 0 refills | Status: AC

## 2018-04-13 MED ORDER — CEFTRIAXONE SODIUM 250 MG IJ SOLR
250.0000 mg | Freq: Once | INTRAMUSCULAR | Status: AC
  Administered 2018-04-13: 250 mg via INTRAMUSCULAR
  Filled 2018-04-13: qty 250

## 2018-04-13 MED ORDER — AZITHROMYCIN 250 MG PO TABS
1000.0000 mg | ORAL_TABLET | Freq: Once | ORAL | Status: AC
  Administered 2018-04-13: 1000 mg via ORAL
  Filled 2018-04-13: qty 4

## 2018-04-13 NOTE — ED Triage Notes (Signed)
Pt reports left shoulder pain and lower back pain for multiple weeks. Also states her BF has STDs and she has had vaginal itching. Endorses a migraine.

## 2018-04-13 NOTE — Discharge Instructions (Addendum)
Please read attached information. If you experience any new or worsening signs or symptoms please return to the emergency room for evaluation. Please follow-up with your primary care provider or specialist as discussed. Please use medication prescribed only as directed and discontinue taking if you have any concerning signs or symptoms.   °

## 2018-04-13 NOTE — ED Provider Notes (Signed)
[facility] EMERGENCY DEPARTMENT Provider Note   CSN: [id number] Arrival date & time: 04/13/18  1004    History   Chief Complaint Chief Complaint  Patient presents with  . Shoulder Pain  . Vaginal Itching    HPI Amy Downs is a 23 y.o. female.     HPI   23 year old female presents today with several complaints.  Patient notes a significant past medical history of chronic shoulder pain.  She was told that she has bursitis of her shoulder and her bones were soon to break.  She notes cracking and popping in the left shoulder, she notes pain with all range of motion.  She denies any increased workload recently.  No distal neurological complaints.  No fever.  Patient also notes her boyfriend has tested positive for STDs, she notes vaginal irritation, she denies any fever or pelvic pain.    Past Medical History:  Diagnosis Date  . Abscess   . Anxiety    no meds  . HSV-2 infection   . Medical history non-contributory     Patient Active Problem List   Diagnosis Date Noted  . Decreased fetal movement     Past Surgical History:  Procedure Laterality Date  . CESAREAN SECTION     twin IUP with PreE  . WISDOM TOOTH EXTRACTION       OB History    Gravida  2   Para  2   Term  1   Preterm  1   AB      Living  3     SAB      TAB      Ectopic      Multiple  1   Live Births  3            Home Medications    Prior to Admission medications   Medication Sig Start Date End Date Taking? Authorizing Provider  BIOTIN PO Take 1 tablet by mouth daily.    [provider]  cyclobenzaprine (FLEXERIL) 5 MG tablet Take 1 tablet (5 mg total) by mouth 3 (three) times daily as needed for muscle spasms. 11/07/17   Burgess Amor, PA-C  fluconazole (DIFLUCAN) 150 MG tablet Take 1 tablet (150 mg total) by mouth daily for 1 day. 04/13/18 04/14/18  Rilley Stash, Tinnie Gens, PA-C  ibuprofen (ADVIL,MOTRIN) 600 MG tablet Take 1 tablet (600 mg total) by mouth  every 6 (six) hours as needed. 11/07/17   Burgess Amor, PA-C  ondansetron (ZOFRAN ODT) 4 MG disintegrating tablet Take 1 tablet (4 mg total) by mouth every 8 (eight) hours as needed for nausea or vomiting. 12/16/17   Bethel Born, PA-C    Family History Family History  Problem Relation Age of Onset  . Heart disease Father     Social History Social History   Tobacco Use  . Smoking status: Current Every Day Smoker    Packs/day: 0.25  . Smokeless tobacco: Never Used  Substance Use Topics  . Alcohol use: No  . Drug use: No     Allergies   Patient has no known allergies.   Review of Systems Review of Systems  All other systems reviewed and are negative.    Physical Exam Updated Vital Signs BP 122/81 (BP Location: Right Arm)   Pulse 82   Temp 98.2 F (36.8 C) (Oral)   Resp 20   LMP 04/07/2018   SpO2 98%   Physical Exam Vitals signs and nursing note reviewed.  Constitutional:  Appearance: She is well-developed.  HENT:     Head: Normocephalic and atraumatic.  Eyes:     General: No scleral icterus.       Right eye: No discharge.        Left eye: No discharge.     Conjunctiva/sclera: Conjunctivae normal.     Pupils: Pupils are equal, round, and reactive to light.  Neck:     Musculoskeletal: Normal range of motion.     Vascular: No JVD.     Trachea: No tracheal deviation.  Pulmonary:     Effort: Pulmonary effort is normal.     Breath sounds: No stridor.  Genitourinary:    Comments: White discharge in the vaginal vault, no cervical motion tenderness, no purulence Neurological:     Mental Status: She is alert and oriented to person, place, and time.     Coordination: Coordination normal.  Psychiatric:        Behavior: Behavior normal.        Thought Content: Thought content normal.        Judgment: Judgment normal.      ED Treatments / Results  Labs (all labs ordered are listed, but only abnormal results are displayed) Labs Reviewed  WET PREP,  GENITAL - Abnormal; Notable for the following components:      Result Value   Yeast Wet Prep HPF POC PRESENT (*)    Trich, Wet Prep PRESENT (*)    Clue Cells Wet Prep HPF POC PRESENT (*)    WBC, Wet Prep HPF POC FEW (*)    All other components within normal limits  GC/CHLAMYDIA PROBE AMP ([place]) NOT AT [facility]    EKG None  Radiology No results found.  Procedures Procedures (including critical care time)  Medications Ordered in ED Medications  lidocaine (PF) (XYLOCAINE) 1 % injection (has no administration in time range)  metroNIDAZOLE (FLAGYL) tablet 2,000 mg (has no administration in time range)  cefTRIAXone (ROCEPHIN) injection 250 mg (250 mg Intramuscular Given 04/13/18 1053)  azithromycin (ZITHROMAX) tablet 1,000 mg (1,000 mg Oral Given 04/13/18 1053)     Initial Impression / Assessment and Plan / ED Course  I have reviewed the triage vital signs and the nursing notes.  Pertinent labs & imaging results that were available during my care of the patient were reviewed by me and considered in my medical decision making (see chart for details).        23 year old female presents today with chronic left shoulder pain.  She is referred to orthopedics.  Patient also with vaginal complaints, she is positive for trichomoniasis, yeast, and treated for gonorrhea and chlamydia.  She is given outpatient follow-up information and return precautions.  Final Clinical Impressions(s) / ED Diagnoses   Final diagnoses:  Trichimoniasis  Chronic left shoulder pain  Yeast infection    ED Discharge Orders         Ordered    fluconazole (DIFLUCAN) 150 MG tablet  Daily     04/13/18 1119           Eyvonne Mechanic, PA-C 04/13/18 1120    Benjiman Core, MD 04/13/18 1546

## 2018-04-13 NOTE — ED Notes (Signed)
Pt verbalizes understanding of d/c instructions. Prescriptions reviewed with patient. Pt ambulatory at d/c with all belongings.  

## 2018-04-14 LAB — GC/CHLAMYDIA PROBE AMP (~~LOC~~) NOT AT ARMC
Chlamydia: NEGATIVE
Neisseria Gonorrhea: NEGATIVE

## 2018-05-04 ENCOUNTER — Other Ambulatory Visit: Payer: Self-pay

## 2018-05-04 ENCOUNTER — Emergency Department (HOSPITAL_COMMUNITY)
Admission: EM | Admit: 2018-05-04 | Discharge: 2018-05-04 | Disposition: A | Discharge status: Home / Self Care | Payer: Medicaid Other | Attending: Emergency Medicine | Admitting: Emergency Medicine

## 2018-05-04 ENCOUNTER — Encounter (HOSPITAL_COMMUNITY): Payer: Self-pay | Admitting: *Deleted

## 2018-05-04 DIAGNOSIS — Z79899 Other long term (current) drug therapy: Secondary | ICD-10-CM | POA: Insufficient documentation

## 2018-05-04 DIAGNOSIS — F17210 Nicotine dependence, cigarettes, uncomplicated: Secondary | ICD-10-CM | POA: Insufficient documentation

## 2018-05-04 DIAGNOSIS — J020 Streptococcal pharyngitis: Secondary | ICD-10-CM

## 2018-05-04 LAB — GROUP A STREP BY PCR: Group A Strep by PCR: DETECTED — AB

## 2018-05-04 MED ORDER — DEXAMETHASONE 4 MG PO TABS
10.0000 mg | ORAL_TABLET | Freq: Once | ORAL | Status: AC
  Administered 2018-05-04: 10 mg via ORAL
  Filled 2018-05-04: qty 3

## 2018-05-04 MED ORDER — PENICILLIN G BENZATHINE 1200000 UNIT/2ML IM SUSP
1.2000 10*6.[IU] | Freq: Once | INTRAMUSCULAR | Status: AC
  Administered 2018-05-04: 1.2 10*6.[IU] via INTRAMUSCULAR
  Filled 2018-05-04: qty 2

## 2018-05-04 NOTE — ED Notes (Signed)
Patient verbalizes understanding of discharge instructions . Opportunity for questions and answers were provided . Armband removed by staff ,Pt discharged from ED. W/C  offered at D/C  and Declined W/C at D/C and was escorted to lobby by RN.  

## 2018-05-04 NOTE — ED Triage Notes (Signed)
Pt reports a sore throat for 2 days.. Pt denies cough .

## 2018-05-04 NOTE — Discharge Instructions (Addendum)
Please read attached information. If you experience any new or worsening signs or symptoms please return to the emergency room for evaluation. Please follow-up with your primary care provider or specialist as discussed. Please use tylenol or ibuprofen as needed for pain.  °

## 2018-05-04 NOTE — ED Provider Notes (Signed)
[facility] EMERGENCY DEPARTMENT Provider Note   CSN: [id number] Arrival date & time: 05/04/18  0932    History   Chief Complaint Chief Complaint  Patient presents with  . Sore Throat    HPI Amy Downs is a 23 y.o. female.     HPI   23 year old female presents today with complaints of sore throat.  Patient notes sore throat started 2 days ago, painful swallowing but no difficulty swallowing.  She denies any fever, upper respiratory congestion rhinorrhea or cough.  She denies any close sick contacts.  She denies any recent travel or exposure to anyone with infectious or high risk exposures.  No medications prior to arrival. Pt reports she is not pregnant.   Past Medical History:  Diagnosis Date  . Abscess   . Anxiety    no meds  . HSV-2 infection   . Medical history non-contributory     Patient Active Problem List   Diagnosis Date Noted  . Decreased fetal movement     Past Surgical History:  Procedure Laterality Date  . CESAREAN SECTION     twin IUP with PreE  . WISDOM TOOTH EXTRACTION       OB History    Gravida  2   Para  2   Term  1   Preterm  1   AB      Living  3     SAB      TAB      Ectopic      Multiple  1   Live Births  3            Home Medications    Prior to Admission medications   Medication Sig Start Date End Date Taking? Authorizing Provider  BIOTIN PO Take 1 tablet by mouth daily.    [provider]  cyclobenzaprine (FLEXERIL) 5 MG tablet Take 1 tablet (5 mg total) by mouth 3 (three) times daily as needed for muscle spasms. 11/07/17   Burgess Amor, PA-C  ibuprofen (ADVIL,MOTRIN) 600 MG tablet Take 1 tablet (600 mg total) by mouth every 6 (six) hours as needed. 11/07/17   Burgess Amor, PA-C  ondansetron (ZOFRAN ODT) 4 MG disintegrating tablet Take 1 tablet (4 mg total) by mouth every 8 (eight) hours as needed for nausea or vomiting. 12/16/17   Bethel Born, PA-C    Family History Family  History  Problem Relation Age of Onset  . Heart disease Father     Social History Social History   Tobacco Use  . Smoking status: Current Every Day Smoker    Packs/day: 0.25  . Smokeless tobacco: Never Used  Substance Use Topics  . Alcohol use: No  . Drug use: No     Allergies   Patient has no known allergies.   Review of Systems Review of Systems  All other systems reviewed and are negative.    Physical Exam Updated Vital Signs BP 128/84 (BP Location: Right Arm)   Pulse (!) 109   Temp 98.4 F (36.9 C) (Oral)   Resp 18   Ht 5\' 9"  (1.753 m)   Wt 124.7 kg   LMP 04/07/2018   SpO2 98%   BMI 40.61 kg/m   Physical Exam Vitals signs and nursing note reviewed.  Constitutional:      Appearance: She is well-developed.  HENT:     Head: Normocephalic and atraumatic.     Comments: Bilateral symmetric tonsillar swelling, no exudate, minor erythema, no pooling  of secretions, no posterior oropharyngeal swelling, uvula midline rises with phonation-bilateral tender anterior cervical lymphadenopathy neck is supple Eyes:     General: No scleral icterus.       Right eye: No discharge.        Left eye: No discharge.     Conjunctiva/sclera: Conjunctivae normal.     Pupils: Pupils are equal, round, and reactive to light.  Neck:     Musculoskeletal: Normal range of motion.     Vascular: No JVD.     Trachea: No tracheal deviation.  Pulmonary:     Effort: Pulmonary effort is normal.     Breath sounds: No stridor.  Neurological:     Mental Status: She is alert and oriented to person, place, and time.     Coordination: Coordination normal.  Psychiatric:        Behavior: Behavior normal.        Thought Content: Thought content normal.        Judgment: Judgment normal.      ED Treatments / Results  Labs (all labs ordered are listed, but only abnormal results are displayed) Labs Reviewed  GROUP A STREP BY PCR - Abnormal; Notable for the following components:      Result  Value   Group A Strep by PCR DETECTED (*)    All other components within normal limits    EKG None  Radiology No results found.  Procedures Procedures (including critical care time)  Medications Ordered in ED Medications  penicillin g benzathine (BICILLIN LA) 1200000 UNIT/2ML injection 1.2 Million Units (has no administration in time range)  dexamethasone (DECADRON) tablet 10 mg (10 mg Oral Given 05/04/18 7414)     Initial Impression / Assessment and Plan / ED Course  I have reviewed the triage vital signs and the nursing notes.  Pertinent labs & imaging results that were available during my care of the patient were reviewed by me and considered in my medical decision making (see chart for details).         Strep PCR positive  23 year old female presents today with strep pharyngitis.  No signs of peritonsillar abscess, well-appearing tolerating secretions.  Patient discharged with return precautions after antibiotics and steroids here.  She verbalized understanding and agreement to today's plan had no further questions or concerns the time discharge.  Final Clinical Impressions(s) / ED Diagnoses   Final diagnoses:  Strep pharyngitis    ED Discharge Orders    None       Rosalio Loud 05/04/18 1007    Jacalyn Lefevre, MD 05/04/18 1012

## 2018-07-12 ENCOUNTER — Emergency Department (HOSPITAL_COMMUNITY)
Admission: EM | Admit: 2018-07-12 | Discharge: 2018-07-12 | Disposition: A | Discharge status: Home / Self Care | Payer: Medicaid Other | Attending: Emergency Medicine | Admitting: Emergency Medicine

## 2018-07-12 ENCOUNTER — Other Ambulatory Visit: Payer: Self-pay

## 2018-07-12 DIAGNOSIS — Z79899 Other long term (current) drug therapy: Secondary | ICD-10-CM | POA: Insufficient documentation

## 2018-07-12 DIAGNOSIS — R519 Headache, unspecified: Secondary | ICD-10-CM

## 2018-07-12 DIAGNOSIS — Z3202 Encounter for pregnancy test, result negative: Secondary | ICD-10-CM | POA: Insufficient documentation

## 2018-07-12 DIAGNOSIS — F17200 Nicotine dependence, unspecified, uncomplicated: Secondary | ICD-10-CM | POA: Insufficient documentation

## 2018-07-12 DIAGNOSIS — R51 Headache: Secondary | ICD-10-CM | POA: Insufficient documentation

## 2018-07-12 DIAGNOSIS — R110 Nausea: Secondary | ICD-10-CM | POA: Insufficient documentation

## 2018-07-12 LAB — POC URINE PREG, ED: Preg Test, Ur: NEGATIVE

## 2018-07-12 MED ORDER — METOCLOPRAMIDE HCL 10 MG PO TABS: 10.0000 mg | ORAL_TABLET | Freq: Once | ORAL | Status: DC

## 2018-07-12 MED ORDER — NAPROXEN 250 MG PO TABS: 500.0000 mg | ORAL_TABLET | Freq: Once | ORAL | Status: DC

## 2018-07-12 MED ORDER — NAPROXEN 500 MG PO TABS: 500.0000 mg | ORAL_TABLET | Freq: Two times a day (BID) | ORAL | 0 refills | Status: AC

## 2018-07-12 MED ORDER — METOCLOPRAMIDE HCL 10 MG PO TABS: 10.0000 mg | ORAL_TABLET | Freq: Four times a day (QID) | ORAL | 0 refills | Status: DC

## 2018-07-12 NOTE — ED Triage Notes (Signed)
C/o intermittent headache and nausea x 3 days.  Denies any pain at present.

## 2018-07-12 NOTE — Discharge Instructions (Addendum)
Please see the information and instructions below regarding your visit.  Your diagnoses today include:  1. Nonintractable episodic headache, unspecified headache type     You were seen and treated in the emergency department today for headache. Fortunately, your vitals, exam, and work-up is reassuring with no apparent emergent cause for your headache at this time.  Tests performed today include: See side panel of your discharge paperwork for testing performed today. Vital signs are listed at the bottom of these instructions.   Medications prescribed:    Try to avoid daily or regular use of tylenol, aspirin, ibuprofen, and other overt-the-counter pain medications as this can contribute to rebound headaches.   Take any prescribed medications only as prescribed, and any over the counter medications only as directed on the packaging.  You are prescribed naproxen, a non-steroidal anti-inflammatory agent (NSAID) for pain. You may take 500 mg every 12 hours as needed for pain. If still requiring this medication around the clock for acute pain after 10 days, please see your primary healthcare provider.  Women who are pregnant, breastfeeding, or planning on becoming pregnant should not take non-steroidal anti-inflammatories such as Advil and Aleve. Tylenol is a safe over the counter pain reliever in pregnant women.  You may combine this medication with Tylenol, 650 mg every 6 hours, so you are receiving something for pain every 3 hours.  This is not a long-term medication unless under the care and direction of your primary provider. Taking this medication long-term and not under the supervision of a healthcare provider could increase the risk of stomach ulcers, kidney problems, and cardiovascular problems such as high blood pressure.   You may take Reglan, 10 mg every 6 hours as needed for nausea.  Please use protection when taking his medications as it should not be taken while pregnant.  Home  care instructions:   Drink plenty of fluids at home. This will help with your headache. Be cautious with caffeine use, as this can cause your headache to rebound when the effects wear off. If you drink more than 2 cups of coffee/caffeinated tea, or caffeinated soda per day, I suggest you wean down that amount.  Please follow any educational materials contained in this packet.   Follow-up instructions: Please follow-up with your primary care provider as soon as possible.   Return instructions:  Please return to the Emergency Department if you experience worsening symptoms. It is VERY important that you monitor your symptoms at home. If you develop worsening headache, new fever, new neck stiffness, rash, focal weakness or numbness, or any other new or concerning symptoms, please return to the ED immediately, as these may be signs that your headache has become a potentially serious and life-threatening condition.  Please return if you have any other emergent concerns.  Additional Information:   Your vital signs today were: BP 114/63 (BP Location: Left Arm)    Pulse 85    Temp 98.3 F (36.8 C) (Oral)    Resp 18    SpO2 100%  If your blood pressure (BP) was elevated on multiple readings during this visit above 130 for the top number or above 80 for the bottom number, please have this repeated by your primary care provider within one month. --------------  Thank you for allowing [place] to participate in your care today.

## 2018-07-13 NOTE — ED Provider Notes (Signed)
[facility] EMERGENCY DEPARTMENT Provider Note   CSN: [id number] Arrival date & time: 07/12/18  1712    History   Chief Complaint Chief Complaint  Patient presents with  . Headache  . Nausea    HPI Amy Downs is a 23 y.o. female.     HPI  Patient is a 23 year old female with past medical history of abscess, anxiety, HSV-2 infection presenting for episodic headaches, nausea, episodic pelvic cramping.  Patient reports that she has had these symptoms for the past 3 to 4 days.  She describes the headache is retro-orbital on the left.  This is a typical headache location for her.  She denies any visual disturbance.  Patient reports that the nausea seems to be intermittent she is not able to associate it with any foods but it is worse in the morning.  Patient was concerned that she may be pregnant and took a pregnancy test 3 days ago since she is late on her menses however it was negative.  Patient thinks that she has some mild pelvic cramping over the past couple days consistent with premenstrual cramps she has experienced previously denies any dysuria, urgency or frequency.  He denies any vaginal discharge or abnormal vaginal bleeding.  Denies taking any remedies for her headaches.  Past Medical History:  Diagnosis Date  . Abscess   . Anxiety    no meds  . HSV-2 infection   . Medical history non-contributory     Patient Active Problem List   Diagnosis Date Noted  . Decreased fetal movement     Past Surgical History:  Procedure Laterality Date  . CESAREAN SECTION     twin IUP with PreE  . WISDOM TOOTH EXTRACTION       OB History    Gravida  2   Para  2   Term  1   Preterm  1   AB      Living  3     SAB      TAB      Ectopic      Multiple  1   Live Births  3            Home Medications    Prior to Admission medications   Medication Sig Start Date End Date Taking? Authorizing Provider  BIOTIN PO Take 1 tablet by mouth daily.     [provider]  cyclobenzaprine (FLEXERIL) 5 MG tablet Take 1 tablet (5 mg total) by mouth 3 (three) times daily as needed for muscle spasms. 11/07/17   Burgess Amor, PA-C  ibuprofen (ADVIL,MOTRIN) 600 MG tablet Take 1 tablet (600 mg total) by mouth every 6 (six) hours as needed. 11/07/17   Burgess Amor, PA-C  metoCLOPramide (REGLAN) 10 MG tablet Take 1 tablet (10 mg total) by mouth every 6 (six) hours. 07/12/18   Aviva Kluver B, PA-C  naproxen (NAPROSYN) 500 MG tablet Take 1 tablet (500 mg total) by mouth 2 (two) times daily for 7 days. 07/12/18 07/19/18  Aviva Kluver B, PA-C  ondansetron (ZOFRAN ODT) 4 MG disintegrating tablet Take 1 tablet (4 mg total) by mouth every 8 (eight) hours as needed for nausea or vomiting. 12/16/17   Bethel Born, PA-C    Family History Family History  Problem Relation Age of Onset  . Heart disease Father     Social History Social History   Tobacco Use  . Smoking status: Current Every Day Smoker    Packs/day: 0.25  .  Smokeless tobacco: Never Used  Substance Use Topics  . Alcohol use: No  . Drug use: No     Allergies   Patient has no known allergies.   Review of Systems Review of Systems  Constitutional: Negative for chills and fever.  Eyes: Negative for visual disturbance.  Gastrointestinal: Positive for nausea and vomiting.  Genitourinary: Positive for frequency. Negative for dysuria, vaginal bleeding and vaginal discharge.  Neurological: Positive for headaches. Negative for weakness and numbness.     Physical Exam Updated Vital Signs BP 114/63 (BP Location: Left Arm)   Pulse 85   Temp 98.3 F (36.8 C) (Oral)   Resp 18   SpO2 100%   Physical Exam Vitals signs and nursing note reviewed.  Constitutional:      General: She is not in acute distress.    Appearance: She is well-developed.  HENT:     Head: Normocephalic and atraumatic.     Mouth/Throat:     Mouth: Mucous membranes are moist.  Eyes:      Conjunctiva/sclera: Conjunctivae normal.     Pupils: Pupils are equal, round, and reactive to light.  Neck:     Musculoskeletal: Normal range of motion and neck supple.  Cardiovascular:     Rate and Rhythm: Normal rate and regular rhythm.     Heart sounds: S1 normal and S2 normal. No murmur.  Pulmonary:     Effort: Pulmonary effort is normal.     Breath sounds: Normal breath sounds.     Comments: Converses comfortably. No audible wheeze or stridor.  Abdominal:     General: There is no distension.     Palpations: Abdomen is soft.     Tenderness: There is no abdominal tenderness. There is no guarding.  Musculoskeletal: Normal range of motion.        General: No deformity.  Skin:    General: Skin is warm and dry.     Findings: No erythema or rash.  Neurological:     Mental Status: She is alert.     GCS: GCS eye subscore is 4. GCS verbal subscore is 5. GCS motor subscore is 6.     Comments: Mental Status:  Alert, oriented, thought content appropriate, able to give a coherent history. Speech fluent without evidence of aphasia. Able to follow 2 step commands without difficulty.  Cranial Nerves:  II:  Peripheral visual fields grossly normal, pupils equal, round, reactive to light III,IV, VI: ptosis not present, extra-ocular motions intact bilaterally  V,VII: smile symmetric, facial light touch sensation equal VIII: hearing grossly normal to voice  X: uvula elevates symmetrically  XI: bilateral shoulder shrug symmetric and strong XII: midline tongue extension without fassiculations Motor:  Normal tone. 5/5 in upper and lower extremities bilaterally including strong and equal grip strength and dorsiflexion/plantar flexion Sensory: light touch normal in all extremities.  Deep Tendon Reflexes: 2+ and symmetric in the biceps and patella. No clonus. Cerebellar: normal finger-to-nose with bilateral upper extremities Gait: normal gait and balance Stance: No pronator drift and good  coordination, strength, and position sense with tapping of bilateral arms (performed in sitting position).    Psychiatric:        Behavior: Behavior normal.        Thought Content: Thought content normal.        Judgment: Judgment normal.      ED Treatments / Results  Labs (all labs ordered are listed, but only abnormal results are displayed) Labs Reviewed  POC URINE PREG, ED  EKG None  Radiology No results found.  Procedures Procedures (including critical care time)  Medications Ordered in ED Medications - No data to display   Initial Impression / Assessment and Plan / ED Course  I have reviewed the triage vital signs and the nursing notes.  Pertinent labs & imaging results that were available during my care of the patient were reviewed by me and considered in my medical decision making (see chart for details).  Clinical Course as of Jul 12 100  Sun Jul 12, 2018  1912 Preg Test, Ur: NEGATIVE [AM]    Clinical Course User Index [AM] Elisha PonderMurray, Taegan Standage B, PA-C       This is a well-appearing 23 year old female presenting with episodic headaches, nausea, abdominal cramping intermittently.  Differential diagnosis includes pregnancy, migraine, premenstrual syndrome.  Her pregnancy test here in emergency department is negative.  Patient did not want to stay for urinalysis work-up as she needed to go pick up her children.  She has not had a headache currently, and appears benign.  She has no red flag symptoms associated with her headache.  Patient given naproxen and Reglan for possible migraine treatment and instructed return to emergency department or primary care for worsening symptoms.  Patient is in understanding and agrees with the plan of care.  Final Clinical Impressions(s) / ED Diagnoses   Final diagnoses:  Nonintractable episodic headache, unspecified headache type    ED Discharge Orders         Ordered    metoCLOPramide (REGLAN) 10 MG tablet  Every 6 hours      07/12/18 1923    naproxen (NAPROSYN) 500 MG tablet  2 times daily     07/12/18 1923           Delia ChimesMurray, Jayli Fogleman B, PA-C 07/13/18 0107    Melene PlanFloyd, Dan, DO 07/13/18 1507

## 2019-07-29 ENCOUNTER — Other Ambulatory Visit: Payer: Self-pay

## 2019-07-29 ENCOUNTER — Emergency Department (HOSPITAL_COMMUNITY)
Admission: EM | Admit: 2019-07-29 | Discharge: 2019-07-29 | Disposition: A | Discharge status: Home / Self Care | Payer: Self-pay | Attending: Emergency Medicine | Admitting: Emergency Medicine

## 2019-07-29 ENCOUNTER — Emergency Department (HOSPITAL_COMMUNITY): Payer: Self-pay

## 2019-07-29 ENCOUNTER — Encounter (HOSPITAL_COMMUNITY): Payer: Self-pay

## 2019-07-29 DIAGNOSIS — G8929 Other chronic pain: Secondary | ICD-10-CM | POA: Insufficient documentation

## 2019-07-29 DIAGNOSIS — N760 Acute vaginitis: Secondary | ICD-10-CM | POA: Insufficient documentation

## 2019-07-29 DIAGNOSIS — F17210 Nicotine dependence, cigarettes, uncomplicated: Secondary | ICD-10-CM | POA: Insufficient documentation

## 2019-07-29 DIAGNOSIS — B9689 Other specified bacterial agents as the cause of diseases classified elsewhere: Secondary | ICD-10-CM | POA: Insufficient documentation

## 2019-07-29 DIAGNOSIS — M549 Dorsalgia, unspecified: Secondary | ICD-10-CM | POA: Insufficient documentation

## 2019-07-29 DIAGNOSIS — Z79899 Other long term (current) drug therapy: Secondary | ICD-10-CM | POA: Insufficient documentation

## 2019-07-29 LAB — BASIC METABOLIC PANEL
Anion gap: 8 (ref 5–15)
BUN: 10 mg/dL (ref 6–20)
CO2: 27 mmol/L (ref 22–32)
Calcium: 8.6 mg/dL — ABNORMAL LOW (ref 8.9–10.3)
Chloride: 104 mmol/L (ref 98–111)
Creatinine, Ser: 0.58 mg/dL (ref 0.44–1.00)
GFR calc Af Amer: >60 mL/min (ref >60)
GFR calc non Af Amer: >60 mL/min (ref >60)
Glucose, Bld: 72 mg/dL (ref 70–99)
Potassium: 4 mmol/L (ref 3.5–5.1)
Sodium: 139 mmol/L (ref 135–145)

## 2019-07-29 LAB — CBC WITH DIFFERENTIAL/PLATELET
Abs Immature Granulocytes: 0.02 10*3/uL (ref 0.00–0.07)
Basophils Absolute: 0 10*3/uL (ref 0.0–0.1)
Basophils Relative: 0 %
Eosinophils Absolute: 0.1 10*3/uL (ref 0.0–0.5)
Eosinophils Relative: 1 %
HCT: 39.9 % (ref 36.0–46.0)
Hemoglobin: 12.4 g/dL (ref 12.0–15.0)
Immature Granulocytes: 0 %
Lymphocytes Relative: 35 %
Lymphs Abs: 2 10*3/uL (ref 0.7–4.0)
MCH: 28.1 pg (ref 26.0–34.0)
MCHC: 31.1 g/dL (ref 30.0–36.0)
MCV: 90.5 fL (ref 80.0–100.0)
Monocytes Absolute: 0.5 10*3/uL (ref 0.1–1.0)
Monocytes Relative: 10 %
Neutro Abs: 3 10*3/uL (ref 1.7–7.7)
Neutrophils Relative %: 54 %
Platelets: 296 10*3/uL (ref 150–400)
RBC: 4.41 MIL/uL (ref 3.87–5.11)
RDW: 13.2 % (ref 11.5–15.5)
WBC: 5.6 10*3/uL (ref 4.0–10.5)
nRBC: 0 % (ref 0.0–0.2)

## 2019-07-29 LAB — URINALYSIS, ROUTINE W REFLEX MICROSCOPIC
Bilirubin Urine: NEGATIVE
Glucose, UA: NEGATIVE mg/dL
Hgb urine dipstick: NEGATIVE
Ketones, ur: NEGATIVE mg/dL
Leukocytes,Ua: NEGATIVE
Nitrite: NEGATIVE
Protein, ur: NEGATIVE mg/dL
Specific Gravity, Urine: 1.018 (ref 1.005–1.030)
pH: 7 (ref 5.0–8.0)

## 2019-07-29 LAB — WET PREP, GENITAL
Sperm: NONE SEEN
Trich, Wet Prep: NONE SEEN
Yeast Wet Prep HPF POC: NONE SEEN

## 2019-07-29 LAB — CBG MONITORING, ED: Glucose-Capillary: 100 mg/dL — ABNORMAL HIGH (ref 70–99)

## 2019-07-29 LAB — POC URINE PREG, ED: Preg Test, Ur: NEGATIVE

## 2019-07-29 MED ORDER — METRONIDAZOLE 500 MG PO TABS
500.0000 mg | ORAL_TABLET | Freq: Two times a day (BID) | ORAL | 0 refills | Status: DC
Start: 2019-07-29 — End: 2022-05-28

## 2019-07-29 MED ORDER — METHOCARBAMOL 500 MG PO TABS
500.0000 mg | ORAL_TABLET | Freq: Two times a day (BID) | ORAL | 0 refills | Status: DC
Start: 2019-07-29 — End: 2020-01-01

## 2019-07-29 NOTE — ED Provider Notes (Signed)
[facility] EMERGENCY DEPARTMENT Provider Note   CSN: [id number] Arrival date & time: 07/29/19  2202     History No chief complaint on file.   Amy Downs is a 24 y.o. female past medical history of abscess, anxiety, HSV-2 who presents for evaluation of multiple complaints.  (1) she reports nausea/vomiting that is been ongoing for the last few weeks.  She reports that she is able to tolerate some p.o. but does report being very nauseous.  She has had episodes where she vomited.  Emesis is nonbloody.  She has not had any associated mental pain or fever.  (2) she reports bruising that has been ongoing for the same amount of time.  He states that she started noticing it on her legs.  She does not recall any trauma, injury.  (3) she reports back pain that has been an ongoing issue.  She states that she has had back pain for several years.  She has been seen for it multiple times and states that she continues to have pain.  She reports that sometimes the pain is in her lower back, left shoulder, upper back.  This pain is worsened by movement.  She denies any new trauma, injury.  She sometimes takes ibuprofen for the pain.  (4) she reports vaginal irritation and malodorous smell.  She states it has been ongoing for the last week.  She has not had any vaginal bleeding.  She is currently sexually active with 1 partner.  They do not use protection.  She denies any chest pain, difficulty breathing, fevers, dysuria, hematuria. Denies fevers, weight loss, numbness/weakness of upper and lower extremities, bowel/bladder incontinence, saddle anesthesia, history of back surgery, history of IVDA.   The history is provided by the patient.       Past Medical History:  Diagnosis Date  . Abscess   . Anxiety    no meds  . HSV-2 infection   . Medical history non-contributory     Patient Active Problem List   Diagnosis Date Noted  . Decreased fetal movement     Past Surgical History:    Procedure Laterality Date  . CESAREAN SECTION     twin IUP with PreE  . WISDOM TOOTH EXTRACTION       OB History    Gravida  2   Para  2   Term  1   Preterm  1   AB      Living  3     SAB      TAB      Ectopic      Multiple  1   Live Births  3           Family History  Problem Relation Age of Onset  . Heart disease Father     Social History   Tobacco Use  . Smoking status: Current Every Day Smoker    Packs/day: 0.25  . Smokeless tobacco: Never Used  Vaping Use  . Vaping Use: Never used  Substance Use Topics  . Alcohol use: No  . Drug use: No    Home Medications Prior to Admission medications   Medication Sig Start Date End Date Taking? Authorizing Provider  BIOTIN PO Take 1 tablet by mouth daily.    [provider]  cyclobenzaprine (FLEXERIL) 5 MG tablet Take 1 tablet (5 mg total) by mouth 3 (three) times daily as needed for muscle spasms. 11/07/17   Burgess Amor, PA-C  ibuprofen (ADVIL,MOTRIN) 600 MG  tablet Take 1 tablet (600 mg total) by mouth every 6 (six) hours as needed. 11/07/17   Evalee Jefferson, PA-C  methocarbamol (ROBAXIN) 500 MG tablet Take 1 tablet (500 mg total) by mouth 2 (two) times daily. 07/29/19   Volanda Napoleon, PA-C  metoCLOPramide (REGLAN) 10 MG tablet Take 1 tablet (10 mg total) by mouth every 6 (six) hours. 07/12/18   Langston Masker B, PA-C  metroNIDAZOLE (FLAGYL) 500 MG tablet Take 1 tablet (500 mg total) by mouth 2 (two) times daily. 07/29/19   Volanda Napoleon, PA-C  ondansetron (ZOFRAN ODT) 4 MG disintegrating tablet Take 1 tablet (4 mg total) by mouth every 8 (eight) hours as needed for nausea or vomiting. 12/16/17   Recardo Evangelist, PA-C    Allergies    Patient has no known allergies.  Review of Systems   Review of Systems  Constitutional: Negative for fever.  Respiratory: Negative for cough and shortness of breath.   Cardiovascular: Negative for chest pain.  Gastrointestinal: Positive for nausea and  vomiting. Negative for abdominal pain.  Genitourinary: Positive for vaginal discharge. Negative for dysuria and hematuria.  Musculoskeletal: Positive for back pain.  Skin: Positive for color change.  Neurological: Negative for weakness, numbness and headaches.  All other systems reviewed and are negative.   Physical Exam Updated Vital Signs BP 117/74 (BP Location: Left Arm)   Pulse 78   Temp 98.6 F (37 C) (Oral)   Resp 16   Ht 5\' 9"  (1.753 m)   Wt 129.3 kg   SpO2 100%   BMI 42.09 kg/m   Physical Exam Vitals and nursing note reviewed. Exam conducted with a chaperone present.  Constitutional:      Appearance: Normal appearance. She is well-developed.  HENT:     Head: Normocephalic and atraumatic.  Eyes:     General: Lids are normal.     Conjunctiva/sclera: Conjunctivae normal.     Pupils: Pupils are equal, round, and reactive to light.  Cardiovascular:     Rate and Rhythm: Normal rate and regular rhythm.     Pulses: Normal pulses.     Heart sounds: Normal heart sounds. No murmur heard.  No friction rub. No gallop.   Pulmonary:     Effort: Pulmonary effort is normal.     Breath sounds: Normal breath sounds.     Comments: Lungs clear to auscultation bilaterally.  Symmetric chest rise.  No wheezing, rales, rhonchi. Abdominal:     Palpations: Abdomen is soft. Abdomen is not rigid.     Tenderness: There is no abdominal tenderness. There is no guarding.     Comments: Abdomen is soft, non-distended, non-tender. No rigidity, No guarding. No peritoneal signs.  Genitourinary:    Cervix: Erythema present. No cervical motion tenderness.     Adnexa:        Right: No mass or tenderness.         Left: No mass or tenderness.       Comments: The exam was performed with a chaperone present. Normal external female genitalia. No lesions, rash, or sores.  Cervical erythema.  No discharge.  No adnexal mass or tenderness noted bilaterally.  No CMT. Musculoskeletal:        General: Normal  range of motion.     Cervical back: Full passive range of motion without pain.       Back:     Comments: Tenderness palpation noted to the left paraspinal muscles of the cervical region.  Midline tenderness  noted T and L-spine.  No deformity or crepitus noted.  Skin:    General: Skin is warm and dry.     Capillary Refill: Capillary refill takes less than 2 seconds.     Comments: 3 cm x 2 cm area of ecchymosis noted to the right mid thigh.  A small bruise approximately 1 cm in diameter noted to the left anterior thigh.  Neurological:     Mental Status: She is alert and oriented to person, place, and time.     Comments: Follows commands, Moves all extremities  5/5 strength to BUE and BLE  Sensation intact throughout all major nerve distributions  Psychiatric:        Speech: Speech normal.     ED Results / Procedures / Treatments   Labs (all labs ordered are listed, but only abnormal results are displayed) Labs Reviewed  WET PREP, GENITAL - Abnormal; Notable for the following components:      Result Value   Clue Cells Wet Prep HPF POC PRESENT (*)    WBC, Wet Prep HPF POC MANY (*)    All other components within normal limits  BASIC METABOLIC PANEL - Abnormal; Notable for the following components:   Calcium 8.6 (*)    All other components within normal limits  CBG MONITORING, ED - Abnormal; Notable for the following components:   Glucose-Capillary 100 (*)    All other components within normal limits  URINALYSIS, ROUTINE W REFLEX MICROSCOPIC  CBC WITH DIFFERENTIAL/PLATELET  I-STAT BETA HCG BLOOD, ED ([facility], [facility], [facility] ONLY)  POC URINE PREG, ED  GC/CHLAMYDIA PROBE AMP ([place]) NOT AT [facility]    EKG None  Radiology DG Thoracic Spine 2 View  Result Date: 07/29/2019 CLINICAL DATA:  Back pain for several months, no known injury, initial encounter EXAM: THORACIC SPINE 2 VIEWS COMPARISON:  None. FINDINGS: There is no evidence of thoracic spine fracture. Alignment is normal. No other  significant bone abnormalities are identified. IMPRESSION: No acute abnormality noted. Electronically Signed   By: Alcide Clever M.D.   On: 07/29/2019 12:13   DG Lumbar Spine Complete  Result Date: 07/29/2019 CLINICAL DATA:  Back pain for several months, no known injury, initial encounter EXAM: LUMBAR SPINE - COMPLETE 4+ VIEW COMPARISON:  None. FINDINGS: Five lumbar type vertebral bodies are well visualized. Vertebral body height is well maintained. No pars defects are seen. No anterolisthesis is noted. No soft tissue abnormality is seen. IMPRESSION: Acute abnormality noted. Electronically Signed   By: Alcide Clever M.D.   On: 07/29/2019 12:13    Procedures Procedures (including critical care time)  Medications Ordered in ED Medications - No data to display  ED Course  I have reviewed the triage vital signs and the nursing notes.  Pertinent labs & imaging results that were available during my care of the patient were reviewed by me and considered in my medical decision making (see chart for details).    MDM Rules/Calculators/A&P                          24 year old female who presents for evaluation of multiple complaints.  Most of these complaints have been ongoing for several weeks.  She states she has not seen any provider for these.  The back pain felt like it is an ongoing chronic issue.  She states sometimes she takes ibuprofen.  She has not had any new trauma, injury.  She reports nausea/vomiting but denies any abdominal pain.  On initially arrival, she is afebrile.  Vital signs are stable.  Benign abdominal exam.  He does have midline T and L-spine tenderness as well as some paraspinal tenderness in the left cervical region.  We will plan for blood work to ensure no acute hematologic emergencies that would be causing her bruising.  Additionally, will plan for pelvic, urine.  I reviewed her records.  She has not had any imaging of her back as well.  We will plan for imaging to ensure no  acute bony abnormality.  I suspect this is most likely musculoskeletal versus mechanical back pain in nature.  Exam not concerning for cauda equina, spinal abscess.  CBC shows no leukocytosis or anemia.  BMP is unremarkable.  Urine pregnancy is negative.  UA negative for any infectious etiology.  Pelvic exam as documented above.  No CMT that would be concerning for PID.  No adnexal mass or tenderness noted.  X-ray of T and L-spine negative for any acute bony abnormalities.  Wet prep is positive for clue cells.  Given that she is symptomatic, will plan to treat.  At this time, suspect that her back pain is most likely musculoskeletal in nature.  She does report that it is worse with movement and bending.  We will plan to treat with a short course of muscle relaxers.  Discussed results with patient.  She is agreeable to plan.  Encouraged at home supportive care measures as well as PCP follow-up. At this time, patient exhibits no emergent life-threatening condition that require further evaluation in ED or admission. Patient had ample opportunity for questions and discussion. All patient's questions were answered with full understanding. Strict return precautions discussed. Patient expresses understanding and agreement to plan.   Portions of this note were generated with Scientist, clinical (histocompatibility and immunogenetics). Dictation errors may occur despite best attempts at proofreading.   Final Clinical Impression(s) / ED Diagnoses Final diagnoses:  Bacterial vaginitis  Chronic back pain, unspecified back location, unspecified back pain laterality    Rx / DC Orders ED Discharge Orders         Ordered    methocarbamol (ROBAXIN) 500 MG tablet  2 times daily     Discontinue  Reprint     07/29/19 1254    metroNIDAZOLE (FLAGYL) 500 MG tablet  2 times daily     Discontinue  Reprint     07/29/19 1254           Maxwell Caul, PA-C 07/29/19 1339    Bethann Berkshire, MD 08/02/19 1520

## 2019-07-29 NOTE — ED Triage Notes (Signed)
Patient complains of ongoing shoulder pain and lower back pain with nausea for several weeks/ months. Reports pain to shoulder and back with any ROM

## 2019-07-29 NOTE — Discharge Instructions (Signed)
You can take Tylenol or Ibuprofen as directed for pain. You can alternate Tylenol and Ibuprofen every 4 hours. If you take Tylenol at 1pm, then you can take Ibuprofen at 5pm. Then you can take Tylenol again at 9pm.   Take Robaxin as prescribed. This medication will make you drowsy so do not drive or drink alcohol when taking it.  Take Flagyl as directed.  It is very important that you do not consume any alcohol while taking this medication as it will cause you to become violently ill.  You have gonorrhea and Chlamydia test pending.  If you are positive, you will be called and notified on how to obtain treatment.  If you are negative, you may not get a call piquantly struck on MyChart or online regarding your results.  Return to the emergency department for any abdominal pain, fever, vomiting, chest pain, difficulty breathing or any other worsening concerning symptoms.

## 2019-07-30 LAB — GC/CHLAMYDIA PROBE AMP (~~LOC~~) NOT AT ARMC
Chlamydia: NEGATIVE
Comment: NEGATIVE
Comment: NORMAL
Neisseria Gonorrhea: NEGATIVE

## 2019-12-17 ENCOUNTER — Emergency Department (HOSPITAL_COMMUNITY)
Admission: EM | Admit: 2019-12-17 | Discharge: 2019-12-17 | Disposition: A | Discharge status: Home / Self Care | Payer: Medicaid Other | Attending: Emergency Medicine | Admitting: Emergency Medicine

## 2019-12-17 ENCOUNTER — Other Ambulatory Visit: Payer: Self-pay

## 2019-12-17 DIAGNOSIS — F17210 Nicotine dependence, cigarettes, uncomplicated: Secondary | ICD-10-CM | POA: Insufficient documentation

## 2019-12-17 DIAGNOSIS — H6690 Otitis media, unspecified, unspecified ear: Secondary | ICD-10-CM

## 2019-12-17 DIAGNOSIS — H6692 Otitis media, unspecified, left ear: Secondary | ICD-10-CM | POA: Insufficient documentation

## 2019-12-17 MED ORDER — AMOXICILLIN 500 MG PO CAPS
500.0000 mg | ORAL_CAPSULE | Freq: Once | ORAL | Status: AC
  Administered 2019-12-17: 500 mg via ORAL
  Filled 2019-12-17: qty 1

## 2019-12-17 MED ORDER — AMOXICILLIN 500 MG PO CAPS
500.0000 mg | ORAL_CAPSULE | Freq: Three times a day (TID) | ORAL | 0 refills | Status: DC
Start: 2019-12-17 — End: 2022-05-28

## 2019-12-17 NOTE — ED Notes (Signed)
Pt states her vision is becoming slowly blurry, nauseous, shooting pan on the left side of head as well as the right ear feels as if someone is stabbing it and the pain is radiating to her temple.

## 2019-12-17 NOTE — ED Provider Notes (Signed)
[facility] EMERGENCY DEPARTMENT Provider Note   CSN: [id number] Arrival date & time: 12/17/19  2440     History Chief Complaint  Patient presents with  . Otalgia  . Headache    Amy Downs is a 24 y.o. female.  Patient presents to the emergency department with chief complaint of left ear pain.  She states that she has had pain for the past 3 days.  She also reports having associated painful bumps behind her ear and beneath her ear.  She denies any fevers or chills.  She denies any sore throat or cough.  She denies treatment prior to arrival.  The history is provided by the patient. No language interpreter was used.       Past Medical History:  Diagnosis Date  . Abscess   . Anxiety    no meds  . HSV-2 infection   . Medical history non-contributory     Patient Active Problem List   Diagnosis Date Noted  . Decreased fetal movement     Past Surgical History:  Procedure Laterality Date  . CESAREAN SECTION     twin IUP with PreE  . WISDOM TOOTH EXTRACTION       OB History    Gravida  2   Para  2   Term  1   Preterm  1   AB      Living  3     SAB      TAB      Ectopic      Multiple  1   Live Births  3           Family History  Problem Relation Age of Onset  . Heart disease Father     Social History   Tobacco Use  . Smoking status: Current Every Day Smoker    Packs/day: 0.25  . Smokeless tobacco: Never Used  Vaping Use  . Vaping Use: Never used  Substance Use Topics  . Alcohol use: No  . Drug use: No    Home Medications Prior to Admission medications   Medication Sig Start Date End Date Taking? Authorizing Provider  BIOTIN PO Take 1 tablet by mouth daily.    [provider]  cyclobenzaprine (FLEXERIL) 5 MG tablet Take 1 tablet (5 mg total) by mouth 3 (three) times daily as needed for muscle spasms. 11/07/17   Burgess Amor, PA-C  ibuprofen (ADVIL,MOTRIN) 600 MG tablet Take 1 tablet (600 mg total) by  mouth every 6 (six) hours as needed. 11/07/17   Burgess Amor, PA-C  methocarbamol (ROBAXIN) 500 MG tablet Take 1 tablet (500 mg total) by mouth 2 (two) times daily. 07/29/19   Maxwell Caul, PA-C  metoCLOPramide (REGLAN) 10 MG tablet Take 1 tablet (10 mg total) by mouth every 6 (six) hours. 07/12/18   Aviva Kluver B, PA-C  metroNIDAZOLE (FLAGYL) 500 MG tablet Take 1 tablet (500 mg total) by mouth 2 (two) times daily. 07/29/19   Maxwell Caul, PA-C  ondansetron (ZOFRAN ODT) 4 MG disintegrating tablet Take 1 tablet (4 mg total) by mouth every 8 (eight) hours as needed for nausea or vomiting. 12/16/17   Bethel Born, PA-C    Allergies    Patient has no known allergies.  Review of Systems   Review of Systems  All other systems reviewed and are negative.   Physical Exam Updated Vital Signs BP (!) 119/91 (BP Location: Right Arm)   Pulse 70   Temp 98 F (  36.7 C) (Oral)   Resp 16   Ht 5\' 7"  (1.702 m)   Wt 127 kg   SpO2 99%   BMI 43.85 kg/m   Physical Exam Vitals and nursing note reviewed.  Constitutional:      General: She is not in acute distress.    Appearance: She is well-developed.  HENT:     Head: Normocephalic and atraumatic.     Ears:     Comments: Left TM is erythematous with congestion, left-sided pre and postauricular lymph nodes tender to palpation    Mouth/Throat:     Comments: Oropharynx is clear Eyes:     Conjunctiva/sclera: Conjunctivae normal.  Cardiovascular:     Rate and Rhythm: Normal rate.     Heart sounds: No murmur heard.   Pulmonary:     Effort: Pulmonary effort is normal. No respiratory distress.  Abdominal:     General: There is no distension.  Musculoskeletal:     Cervical back: Neck supple.     Comments: Moves all extremities  Skin:    General: Skin is warm and dry.  Neurological:     Mental Status: She is alert and oriented to person, place, and time.  Psychiatric:        Mood and Affect: Mood normal.        Behavior: Behavior  normal.     ED Results / Procedures / Treatments   Labs (all labs ordered are listed, but only abnormal results are displayed) Labs Reviewed - No data to display  EKG None  Radiology No results found.  Procedures Procedures (including critical care time)  Medications Ordered in ED Medications  amoxicillin (AMOXIL) capsule 500 mg (500 mg Oral Given 12/17/19 0542)    ED Course  I have reviewed the triage vital signs and the nursing notes.  Pertinent labs & imaging results that were available during my care of the patient were reviewed by me and considered in my medical decision making (see chart for details).    MDM Rules/Calculators/A&P                          Patient here with left ear pain.  Has associated swollen and tender lymph nodes.  Left sided TM is erythematous with some congestion.  Will treat for otitis. Final Clinical Impression(s) / ED Diagnoses Final diagnoses:  Acute otitis media, unspecified otitis media type    Rx / DC Orders ED Discharge Orders         Ordered    amoxicillin (AMOXIL) 500 MG capsule  3 times daily        12/17/19 0551           12/19/19, PA-C 12/17/19 12/19/19    [id number], MD 12/17/19 [phone]

## 2019-12-17 NOTE — ED Triage Notes (Signed)
Pt has been having an ear pain with some swelling behind the left ear x 3 days. Pt said her head hurts on the left side also. There is swollen area on the back of the ear and the bottom of her ear.

## 2020-01-01 ENCOUNTER — Emergency Department (HOSPITAL_COMMUNITY): Payer: Self-pay

## 2020-01-01 ENCOUNTER — Encounter (HOSPITAL_COMMUNITY): Payer: Self-pay | Admitting: Emergency Medicine

## 2020-01-01 ENCOUNTER — Other Ambulatory Visit: Payer: Self-pay

## 2020-01-01 ENCOUNTER — Emergency Department (HOSPITAL_COMMUNITY)
Admission: EM | Admit: 2020-01-01 | Discharge: 2020-01-02 | Disposition: A | Discharge status: Home / Self Care | Payer: Self-pay | Attending: Emergency Medicine | Admitting: Emergency Medicine

## 2020-01-01 DIAGNOSIS — T310 Burns involving less than 10% of body surface: Secondary | ICD-10-CM | POA: Insufficient documentation

## 2020-01-01 DIAGNOSIS — S0990XA Unspecified injury of head, initial encounter: Secondary | ICD-10-CM | POA: Insufficient documentation

## 2020-01-01 DIAGNOSIS — T300 Burn of unspecified body region, unspecified degree: Secondary | ICD-10-CM

## 2020-01-01 DIAGNOSIS — R112 Nausea with vomiting, unspecified: Secondary | ICD-10-CM | POA: Insufficient documentation

## 2020-01-01 DIAGNOSIS — W2210XA Striking against or struck by unspecified automobile airbag, initial encounter: Secondary | ICD-10-CM | POA: Insufficient documentation

## 2020-01-01 DIAGNOSIS — F17210 Nicotine dependence, cigarettes, uncomplicated: Secondary | ICD-10-CM | POA: Insufficient documentation

## 2020-01-01 DIAGNOSIS — S301XXA Contusion of abdominal wall, initial encounter: Secondary | ICD-10-CM | POA: Insufficient documentation

## 2020-01-01 DIAGNOSIS — X19XXXA Contact with other heat and hot substances, initial encounter: Secondary | ICD-10-CM | POA: Insufficient documentation

## 2020-01-01 DIAGNOSIS — T1490XA Injury, unspecified, initial encounter: Secondary | ICD-10-CM

## 2020-01-01 DIAGNOSIS — S20212A Contusion of left front wall of thorax, initial encounter: Secondary | ICD-10-CM | POA: Insufficient documentation

## 2020-01-01 DIAGNOSIS — T2102XA Burn of unspecified degree of abdominal wall, initial encounter: Secondary | ICD-10-CM | POA: Insufficient documentation

## 2020-01-01 LAB — CBC WITH DIFFERENTIAL/PLATELET
Abs Immature Granulocytes: 0.04 10*3/uL (ref 0.00–0.07)
Basophils Absolute: 0 10*3/uL (ref 0.0–0.1)
Basophils Relative: 0 %
Eosinophils Absolute: 0.1 10*3/uL (ref 0.0–0.5)
Eosinophils Relative: 2 %
HCT: 37.8 % (ref 36.0–46.0)
Hemoglobin: 11.8 g/dL — ABNORMAL LOW (ref 12.0–15.0)
Immature Granulocytes: 1 %
Lymphocytes Relative: 27 %
Lymphs Abs: 1.9 10*3/uL (ref 0.7–4.0)
MCH: 28.2 pg (ref 26.0–34.0)
MCHC: 31.2 g/dL (ref 30.0–36.0)
MCV: 90.2 fL (ref 80.0–100.0)
Monocytes Absolute: 0.5 10*3/uL (ref 0.1–1.0)
Monocytes Relative: 7 %
Neutro Abs: 4.3 10*3/uL (ref 1.7–7.7)
Neutrophils Relative %: 63 %
Platelets: 313 10*3/uL (ref 150–400)
RBC: 4.19 MIL/uL (ref 3.87–5.11)
RDW: 13.2 % (ref 11.5–15.5)
WBC: 6.9 10*3/uL (ref 4.0–10.5)
nRBC: 0 % (ref 0.0–0.2)

## 2020-01-01 LAB — COMPREHENSIVE METABOLIC PANEL WITH GFR
ALT: 12 U/L (ref 0–44)
AST: 17 U/L (ref 15–41)
Albumin: 3.2 g/dL — ABNORMAL LOW (ref 3.5–5.0)
Alkaline Phosphatase: 41 U/L (ref 38–126)
Anion gap: 10 (ref 5–15)
BUN: 10 mg/dL (ref 6–20)
CO2: 26 mmol/L (ref 22–32)
Calcium: 8.6 mg/dL — ABNORMAL LOW (ref 8.9–10.3)
Chloride: 101 mmol/L (ref 98–111)
Creatinine, Ser: 0.92 mg/dL (ref 0.44–1.00)
GFR, Estimated: >60 mL/min
Glucose, Bld: 98 mg/dL (ref 70–99)
Potassium: 3.5 mmol/L (ref 3.5–5.1)
Sodium: 137 mmol/L (ref 135–145)
Total Bilirubin: 0.3 mg/dL (ref 0.3–1.2)
Total Protein: 6.7 g/dL (ref 6.5–8.1)

## 2020-01-01 LAB — I-STAT BETA HCG BLOOD, ED (MC, WL, AP ONLY): I-stat hCG, quantitative: <5 m[IU]/mL (ref <5)

## 2020-01-01 MED ORDER — METHOCARBAMOL 500 MG PO TABS: 500.0000 mg | ORAL_TABLET | Freq: Three times a day (TID) | ORAL | 0 refills | Status: DC | PRN

## 2020-01-01 MED ORDER — FENTANYL CITRATE (PF) 100 MCG/2ML IJ SOLN
50.0000 ug | Freq: Once | INTRAMUSCULAR | Status: AC
  Administered 2020-01-01: 50 ug via INTRAVENOUS
  Filled 2020-01-01: qty 2

## 2020-01-01 MED ORDER — OXYCODONE-ACETAMINOPHEN 5-325 MG PO TABS: 1.0000 | ORAL_TABLET | Freq: Once | ORAL | Status: DC

## 2020-01-01 MED ORDER — ACETAMINOPHEN 325 MG PO TABS
650.0000 mg | ORAL_TABLET | Freq: Once | ORAL | Status: AC
  Administered 2020-01-01: 650 mg via ORAL
  Filled 2020-01-01: qty 2

## 2020-01-01 MED ORDER — SODIUM CHLORIDE 0.9 % IV SOLN: INTRAVENOUS | Status: DC

## 2020-01-01 MED ORDER — IBUPROFEN 400 MG PO TABS
600.0000 mg | ORAL_TABLET | Freq: Once | ORAL | Status: AC
  Administered 2020-01-01: 600 mg via ORAL
  Filled 2020-01-01: qty 1

## 2020-01-01 MED ORDER — IOHEXOL 300 MG/ML  SOLN
100.0000 mL | Freq: Once | INTRAMUSCULAR | Status: AC | PRN
  Administered 2020-01-01: 100 mL via INTRAVENOUS

## 2020-01-01 MED ORDER — ONDANSETRON HCL 4 MG/2ML IJ SOLN
4.0000 mg | Freq: Once | INTRAMUSCULAR | Status: AC | PRN
  Administered 2020-01-01: 4 mg via INTRAVENOUS
  Filled 2020-01-01: qty 2

## 2020-01-01 MED ORDER — SILVER SULFADIAZINE 1 % EX CREA
TOPICAL_CREAM | Freq: Once | CUTANEOUS | Status: AC
  Administered 2020-01-01: 1 via TOPICAL
  Filled 2020-01-01: qty 85

## 2020-01-01 NOTE — ED Notes (Signed)
Pt ambulates steadily but seems a little off balance
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
## 2020-01-01 NOTE — ED Notes (Addendum)
Pt ambulated and walk to the bathroom.

## 2020-01-02 NOTE — ED Notes (Signed)
Pt. Refused ice.

## 2020-01-08 ENCOUNTER — Encounter (HOSPITAL_COMMUNITY): Payer: Self-pay | Admitting: *Deleted

## 2020-01-08 ENCOUNTER — Other Ambulatory Visit: Payer: Self-pay

## 2020-01-08 ENCOUNTER — Emergency Department (HOSPITAL_COMMUNITY)
Admission: EM | Admit: 2020-01-08 | Discharge: 2020-01-08 | Disposition: A | Discharge status: Home / Self Care | Payer: Medicaid Other | Attending: Emergency Medicine | Admitting: Emergency Medicine

## 2020-01-08 DIAGNOSIS — X19XXXD Contact with other heat and hot substances, subsequent encounter: Secondary | ICD-10-CM | POA: Insufficient documentation

## 2020-01-08 DIAGNOSIS — R109 Unspecified abdominal pain: Secondary | ICD-10-CM | POA: Insufficient documentation

## 2020-01-08 DIAGNOSIS — R1110 Vomiting, unspecified: Secondary | ICD-10-CM | POA: Insufficient documentation

## 2020-01-08 DIAGNOSIS — F17200 Nicotine dependence, unspecified, uncomplicated: Secondary | ICD-10-CM | POA: Insufficient documentation

## 2020-01-08 DIAGNOSIS — T2102XD Burn of unspecified degree of abdominal wall, subsequent encounter: Secondary | ICD-10-CM | POA: Insufficient documentation

## 2020-01-08 DIAGNOSIS — T300 Burn of unspecified body region, unspecified degree: Secondary | ICD-10-CM

## 2020-01-08 DIAGNOSIS — R197 Diarrhea, unspecified: Secondary | ICD-10-CM | POA: Insufficient documentation

## 2020-01-08 LAB — BASIC METABOLIC PANEL
Anion gap: 7 (ref 5–15)
BUN: 11 mg/dL (ref 6–20)
CO2: 28 mmol/L (ref 22–32)
Calcium: 8.5 mg/dL — ABNORMAL LOW (ref 8.9–10.3)
Chloride: 101 mmol/L (ref 98–111)
Creatinine, Ser: 0.83 mg/dL (ref 0.44–1.00)
GFR, Estimated: >60 mL/min (ref >60)
Glucose, Bld: 104 mg/dL — ABNORMAL HIGH (ref 70–99)
Potassium: 3.9 mmol/L (ref 3.5–5.1)
Sodium: 136 mmol/L (ref 135–145)

## 2020-01-08 LAB — CBC WITH DIFFERENTIAL/PLATELET
Abs Immature Granulocytes: 0.02 10*3/uL (ref 0.00–0.07)
Basophils Absolute: 0 10*3/uL (ref 0.0–0.1)
Basophils Relative: 0 %
Eosinophils Absolute: 0.1 10*3/uL (ref 0.0–0.5)
Eosinophils Relative: 2 %
HCT: 36.4 % (ref 36.0–46.0)
Hemoglobin: 11.4 g/dL — ABNORMAL LOW (ref 12.0–15.0)
Immature Granulocytes: 0 %
Lymphocytes Relative: 33 %
Lymphs Abs: 1.5 10*3/uL (ref 0.7–4.0)
MCH: 28.5 pg (ref 26.0–34.0)
MCHC: 31.3 g/dL (ref 30.0–36.0)
MCV: 91 fL (ref 80.0–100.0)
Monocytes Absolute: 0.5 10*3/uL (ref 0.1–1.0)
Monocytes Relative: 11 %
Neutro Abs: 2.5 10*3/uL (ref 1.7–7.7)
Neutrophils Relative %: 54 %
Platelets: 267 10*3/uL (ref 150–400)
RBC: 4 MIL/uL (ref 3.87–5.11)
RDW: 13.1 % (ref 11.5–15.5)
WBC: 4.6 10*3/uL (ref 4.0–10.5)
nRBC: 0 % (ref 0.0–0.2)

## 2020-01-08 MED ORDER — HYDROCODONE-ACETAMINOPHEN 5-325 MG PO TABS
1.0000 | ORAL_TABLET | Freq: Four times a day (QID) | ORAL | 0 refills | Status: DC | PRN
Start: 2020-01-08 — End: 2022-05-28

## 2020-01-08 MED ORDER — ONDANSETRON 4 MG PO TBDP: 4.0000 mg | ORAL_TABLET | Freq: Three times a day (TID) | ORAL | 0 refills | Status: DC | PRN

## 2020-01-08 MED ORDER — ONDANSETRON HCL 4 MG/2ML IJ SOLN
4.0000 mg | Freq: Once | INTRAMUSCULAR | Status: AC
  Administered 2020-01-08: 4 mg via INTRAVENOUS
  Filled 2020-01-08: qty 2

## 2020-01-08 MED ORDER — MORPHINE SULFATE (PF) 4 MG/ML IV SOLN
4.0000 mg | Freq: Once | INTRAVENOUS | Status: AC
  Administered 2020-01-08: 4 mg via INTRAVENOUS
  Filled 2020-01-08: qty 1
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
## 2020-01-08 NOTE — Discharge Instructions (Signed)
Apply the Silvadene cream or Neosporin to help prevent an infection.  You can take the pain medication as prescribed. Follow-up with your primary care provider. Return to the ER if you start to experience worsening pain, drainage from the area, fevers or red streaks.
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
## 2020-05-01 ENCOUNTER — Other Ambulatory Visit: Payer: Self-pay

## 2020-05-01 ENCOUNTER — Encounter (HOSPITAL_COMMUNITY): Payer: Self-pay | Admitting: Emergency Medicine

## 2020-05-01 ENCOUNTER — Emergency Department (HOSPITAL_COMMUNITY): Payer: Self-pay

## 2020-05-01 ENCOUNTER — Emergency Department (HOSPITAL_COMMUNITY)
Admission: EM | Admit: 2020-05-01 | Discharge: 2020-05-01 | Disposition: A | Discharge status: Home / Self Care | Payer: Self-pay | Attending: Emergency Medicine | Admitting: Emergency Medicine

## 2020-05-01 DIAGNOSIS — F17200 Nicotine dependence, unspecified, uncomplicated: Secondary | ICD-10-CM | POA: Insufficient documentation

## 2020-05-01 DIAGNOSIS — W010XXA Fall on same level from slipping, tripping and stumbling without subsequent striking against object, initial encounter: Secondary | ICD-10-CM | POA: Insufficient documentation

## 2020-05-01 DIAGNOSIS — S0990XA Unspecified injury of head, initial encounter: Secondary | ICD-10-CM | POA: Insufficient documentation

## 2020-05-01 DIAGNOSIS — M546 Pain in thoracic spine: Secondary | ICD-10-CM | POA: Insufficient documentation

## 2020-05-01 DIAGNOSIS — W19XXXA Unspecified fall, initial encounter: Secondary | ICD-10-CM

## 2020-05-01 MED ORDER — OXYCODONE-ACETAMINOPHEN 5-325 MG PO TABS
1.0000 | ORAL_TABLET | Freq: Once | ORAL | Status: AC
  Administered 2020-05-01: 1 via ORAL
  Filled 2020-05-01: qty 1

## 2020-05-01 MED ORDER — KETOROLAC TROMETHAMINE 15 MG/ML IJ SOLN
15.0000 mg | Freq: Once | INTRAMUSCULAR | Status: AC
  Administered 2020-05-01: 15 mg via INTRAMUSCULAR

## 2020-05-01 MED ORDER — NAPROXEN 500 MG PO TABS: 500.0000 mg | ORAL_TABLET | Freq: Two times a day (BID) | ORAL | 0 refills | Status: AC

## 2020-05-01 MED ORDER — ONDANSETRON 4 MG PO TBDP
4.0000 mg | ORAL_TABLET | Freq: Once | ORAL | Status: AC
  Administered 2020-05-01: 4 mg via ORAL
  Filled 2020-05-01: qty 1

## 2020-05-01 MED ORDER — KETOROLAC TROMETHAMINE 15 MG/ML IJ SOLN
15.0000 mg | Freq: Once | INTRAMUSCULAR | Status: DC
  Filled 2020-05-01: qty 1

## 2020-05-01 NOTE — ED Triage Notes (Signed)
Patient from home. Patient complaint of slip and fall yesterday. Complaint of neck and back pain today. VSS. NAD.

## 2020-05-01 NOTE — ED Provider Notes (Signed)
[facility] EMERGENCY DEPARTMENT Provider Note   CSN: [id number] Arrival date & time: 05/01/20  8250     History Chief Complaint  Patient presents with  . Fall    Amy Downs is a 25 y.o. female.  Patient presents ER chief complaint of a fall last night.  She said she lost her balance and fell.  Unsure if she passed out or not per patient.  Complaining of a headache as well as some left-sided neck pain and generalized pains in the upper back region.  She states that although she has various body aches her primary concern is her headache and episodes of "fogginess."  She states her back and neck pain feels like aches.  Denies any wrist or knee or hip or ankle pain.  Denies any fever cough or vomiting or diarrhea.        Past Medical History:  Diagnosis Date  . Abscess   . Anxiety    no meds  . HSV-2 infection   . Medical history non-contributory     Patient Active Problem List   Diagnosis Date Noted  . Decreased fetal movement     Past Surgical History:  Procedure Laterality Date  . CESAREAN SECTION     twin IUP with PreE  . WISDOM TOOTH EXTRACTION       OB History    Gravida  2   Para  2   Term  1   Preterm  1   AB      Living  3     SAB      IAB      Ectopic      Multiple  1   Live Births  3           Family History  Problem Relation Age of Onset  . Heart disease Father     Social History   Tobacco Use  . Smoking status: Current Every Day Smoker    Packs/day: 0.25  . Smokeless tobacco: Never Used  Vaping Use  . Vaping Use: Never used  Substance Use Topics  . Alcohol use: No  . Drug use: No    Home Medications Prior to Admission medications   Medication Sig Start Date End Date Taking? Authorizing Provider  naproxen (NAPROSYN) 500 MG tablet Take 1 tablet (500 mg total) by mouth 2 (two) times daily with a meal for 7 days. 05/01/20 05/08/20 Yes Cheryll Cockayne, MD  amoxicillin (AMOXIL) 500 MG capsule Take 1  capsule (500 mg total) by mouth 3 (three) times daily. 12/17/19   Roxy Horseman, PA-C  BIOTIN PO Take 1 tablet by mouth daily.    [provider]  cyclobenzaprine (FLEXERIL) 5 MG tablet Take 1 tablet (5 mg total) by mouth 3 (three) times daily as needed for muscle spasms. 11/07/17   Burgess Amor, PA-C  HYDROcodone-acetaminophen (NORCO/VICODIN) 5-325 MG tablet Take 1 tablet by mouth every 6 (six) hours as needed. 01/08/20   Khatri, Hina, PA-C  ibuprofen (ADVIL,MOTRIN) 600 MG tablet Take 1 tablet (600 mg total) by mouth every 6 (six) hours as needed. 11/07/17   Burgess Amor, PA-C  methocarbamol (ROBAXIN) 500 MG tablet Take 1 tablet (500 mg total) by mouth every 8 (eight) hours as needed for muscle spasms. 01/01/20   Cristina Gong, PA-C  metoCLOPramide (REGLAN) 10 MG tablet Take 1 tablet (10 mg total) by mouth every 6 (six) hours. 07/12/18   Aviva Kluver B, PA-C  metroNIDAZOLE (FLAGYL) 500 MG  tablet Take 1 tablet (500 mg total) by mouth 2 (two) times daily. 07/29/19   Maxwell Caul, PA-C  ondansetron (ZOFRAN ODT) 4 MG disintegrating tablet Take 1 tablet (4 mg total) by mouth every 8 (eight) hours as needed for nausea or vomiting. 01/08/20   Dietrich Pates, PA-C    Allergies    Patient has no known allergies.  Review of Systems   Review of Systems  Constitutional: Negative for fever.  HENT: Negative for ear pain.   Eyes: Negative for pain.  Respiratory: Negative for cough.   Cardiovascular: Negative for chest pain.  Gastrointestinal: Negative for abdominal pain.  Genitourinary: Negative for flank pain.  Musculoskeletal: Positive for back pain.  Skin: Negative for rash.  Neurological: Positive for headaches.    Physical Exam Updated Vital Signs BP 115/81 (BP Location: Right Arm)   Pulse 76   Temp 98.5 F (36.9 C)   Resp 18   SpO2 100%   Physical Exam Constitutional:      General: She is not in acute distress.    Appearance: Normal appearance.  HENT:     Head:  Normocephalic.     Nose: Nose normal.  Eyes:     Extraocular Movements: Extraocular movements intact.  Cardiovascular:     Rate and Rhythm: Normal rate.  Pulmonary:     Effort: Pulmonary effort is normal.  Musculoskeletal:        General: Normal range of motion.     Cervical back: Normal range of motion.     Comments: No C or T or L-spine midline tenderness or step-offs noted.  Neurological:     General: No focal deficit present.     Mental Status: She is alert. Mental status is at baseline.     Cranial Nerves: No cranial nerve deficit.     Motor: No weakness.     Gait: Gait normal.     ED Results / Procedures / Treatments   Labs (all labs ordered are listed, but only abnormal results are displayed) Labs Reviewed - No data to display  EKG None  Radiology CT Head Wo Contrast  Result Date: 05/01/2020 CLINICAL DATA:  Fall, hit head EXAM: CT HEAD WITHOUT CONTRAST TECHNIQUE: Contiguous axial images were obtained from the base of the skull through the vertex without intravenous contrast. COMPARISON:  01/01/2020 FINDINGS: Brain: No acute intracranial abnormality. Specifically, no hemorrhage, hydrocephalus, mass lesion, acute infarction, or significant intracranial injury. Vascular: No hyperdense vessel or unexpected calcification. Skull: No acute calvarial abnormality. Sinuses/Orbits: No acute findings Other: None IMPRESSION: Normal study. Electronically Signed   By: Charlett Nose M.D.   On: 05/01/2020 12:04    Procedures Procedures   Medications Ordered in ED Medications  oxyCODONE-acetaminophen (PERCOCET/ROXICET) 5-325 MG per tablet 1 tablet (1 tablet Oral Given 05/01/20 1058)  ondansetron (ZOFRAN-ODT) disintegrating tablet 4 mg (4 mg Oral Given 05/01/20 1058)  ketorolac (TORADOL) 15 MG/ML injection 15 mg (15 mg Intramuscular Given 05/01/20 1057)    ED Course  I have reviewed the triage vital signs and the nursing notes.  Pertinent labs & imaging results that were available  during my care of the patient were reviewed by me and considered in my medical decision making (see chart for details).    MDM Rules/Calculators/A&P                          CT imaging the brain is unremarkable no acute findings or pathology noted.  Patient has  no focal neuro deficit.  Will be discharged home, advised follow-up with her doctor in 2 or 3 days.  Advised me to return for worsening symptoms fevers difficulty breathing or any additional concerns.  Final Clinical Impression(s) / ED Diagnoses Final diagnoses:  Fall, initial encounter  Injury of head, initial encounter    Rx / DC Orders ED Discharge Orders         Ordered    naproxen (NAPROSYN) 500 MG tablet  2 times daily with meals        05/01/20 1226           Cove, Eustace Moore, MD 05/01/20 1227

## 2020-05-01 NOTE — ED Notes (Signed)
Pt stated understanding of DC information. Resp even and unlabored. Friend to drive patient home. Ambulatory with steady gait.

## 2020-05-01 NOTE — Discharge Instructions (Signed)
Call your primary care doctor or specialist as discussed in the next 2-3 days.   Return immediately back to the ER if:  Your symptoms worsen within the next 12-24 hours. You develop new symptoms such as new fevers, persistent vomiting, new pain, shortness of breath, or new weakness or numbness, or if you have any other concerns.  

## 2020-10-23 ENCOUNTER — Emergency Department (HOSPITAL_COMMUNITY)
Admission: EM | Admit: 2020-10-23 | Discharge: 2020-10-23 | Disposition: A | Discharge status: Home / Self Care | Payer: Medicaid Other | Attending: Emergency Medicine | Admitting: Emergency Medicine

## 2020-10-23 ENCOUNTER — Encounter (HOSPITAL_COMMUNITY): Payer: Self-pay

## 2020-10-23 ENCOUNTER — Emergency Department (HOSPITAL_COMMUNITY): Payer: Medicaid Other

## 2020-10-23 ENCOUNTER — Other Ambulatory Visit: Payer: Self-pay

## 2020-10-23 DIAGNOSIS — R079 Chest pain, unspecified: Secondary | ICD-10-CM | POA: Insufficient documentation

## 2020-10-23 DIAGNOSIS — F17210 Nicotine dependence, cigarettes, uncomplicated: Secondary | ICD-10-CM | POA: Insufficient documentation

## 2020-10-23 DIAGNOSIS — R0981 Nasal congestion: Secondary | ICD-10-CM | POA: Insufficient documentation

## 2020-10-23 DIAGNOSIS — J029 Acute pharyngitis, unspecified: Secondary | ICD-10-CM | POA: Insufficient documentation

## 2020-10-23 DIAGNOSIS — Z20822 Contact with and (suspected) exposure to covid-19: Secondary | ICD-10-CM | POA: Insufficient documentation

## 2020-10-23 DIAGNOSIS — M791 Myalgia, unspecified site: Secondary | ICD-10-CM | POA: Insufficient documentation

## 2020-10-23 LAB — BASIC METABOLIC PANEL
Anion gap: 8 (ref 5–15)
BUN: 9 mg/dL (ref 6–20)
CO2: 24 mmol/L (ref 22–32)
Calcium: 8.8 mg/dL — ABNORMAL LOW (ref 8.9–10.3)
Chloride: 102 mmol/L (ref 98–111)
Creatinine, Ser: 0.74 mg/dL (ref 0.44–1.00)
GFR, Estimated: >60 mL/min (ref >60)
Glucose, Bld: 96 mg/dL (ref 70–99)
Potassium: 3.9 mmol/L (ref 3.5–5.1)
Sodium: 134 mmol/L — ABNORMAL LOW (ref 135–145)

## 2020-10-23 LAB — RESP PANEL BY RT-PCR (FLU A&B, COVID) ARPGX2
Influenza A by PCR: NEGATIVE
Influenza B by PCR: NEGATIVE
SARS Coronavirus 2 by RT PCR: NEGATIVE

## 2020-10-23 LAB — URINALYSIS, ROUTINE W REFLEX MICROSCOPIC
Bilirubin Urine: NEGATIVE
Glucose, UA: NEGATIVE mg/dL
Hgb urine dipstick: NEGATIVE
Ketones, ur: NEGATIVE mg/dL
Leukocytes,Ua: NEGATIVE
Nitrite: NEGATIVE
Protein, ur: NEGATIVE mg/dL
Specific Gravity, Urine: 1.019 (ref 1.005–1.030)
pH: 8 (ref 5.0–8.0)

## 2020-10-23 LAB — MONONUCLEOSIS SCREEN: Mono Screen: NEGATIVE

## 2020-10-23 LAB — CBC
HCT: 37.9 % (ref 36.0–46.0)
Hemoglobin: 12.8 g/dL (ref 12.0–15.0)
MCH: 29.5 pg (ref 26.0–34.0)
MCHC: 33.8 g/dL (ref 30.0–36.0)
MCV: 87.3 fL (ref 80.0–100.0)
Platelets: 336 10*3/uL (ref 150–400)
RBC: 4.34 MIL/uL (ref 3.87–5.11)
RDW: 13.2 % (ref 11.5–15.5)
WBC: 4.8 10*3/uL (ref 4.0–10.5)
nRBC: 0 % (ref 0.0–0.2)

## 2020-10-23 LAB — I-STAT BETA HCG BLOOD, ED (MC, WL, AP ONLY): I-stat hCG, quantitative: <5 m[IU]/mL (ref <5)

## 2020-10-23 LAB — GROUP A STREP BY PCR: Group A Strep by PCR: NOT DETECTED

## 2020-10-23 MED ORDER — METOCLOPRAMIDE HCL 5 MG/ML IJ SOLN
10.0000 mg | Freq: Once | INTRAMUSCULAR | Status: AC
  Administered 2020-10-23: 10 mg via INTRAVENOUS
  Filled 2020-10-23: qty 2

## 2020-10-23 MED ORDER — ONDANSETRON HCL 4 MG/2ML IJ SOLN
4.0000 mg | Freq: Once | INTRAMUSCULAR | Status: AC
  Administered 2020-10-23: 4 mg via INTRAVENOUS
  Filled 2020-10-23: qty 2

## 2020-10-23 MED ORDER — ONDANSETRON 4 MG PO TBDP
4.0000 mg | ORAL_TABLET | Freq: Once | ORAL | Status: AC
  Administered 2020-10-23: 4 mg via ORAL
  Filled 2020-10-23: qty 1

## 2020-10-23 MED ORDER — ONDANSETRON HCL 4 MG PO TABS: 4.0000 mg | ORAL_TABLET | Freq: Four times a day (QID) | ORAL | 0 refills | Status: AC

## 2020-10-23 NOTE — ED Notes (Signed)
Patient transported to X-ray 

## 2020-10-23 NOTE — ED Provider Notes (Signed)
[facility] EMERGENCY DEPARTMENT Provider Note   CSN: [id number] Arrival date & time: 10/23/20  1205     History Chief Complaint  Patient presents with  . Sore Throat  . Chest Pain    Amy Downs is a 25 y.o. female presenting with a complaint of 3 months of tonsil swelling.  She reports that her right ear has begun to bother her" feels like there is water in it."  Endorses associated body aches, chest congestion and difficulty taking a deep breath.  Denies any fevers, chills, abdominal pain, leg swelling or OCP use.  No cough.  No known sick contacts.  Reports 1 episode of emesis 3 days ago but reports some nausea.  LMP 3 days ago.    Sore Throat Pertinent negatives include no chest pain, no abdominal pain, no headaches and no shortness of breath.  Chest Pain Associated symptoms: nausea and vomiting   Associated symptoms: no abdominal pain, no dizziness, no dysphagia, no fever, no headache, no palpitations and no shortness of breath       Past Medical History:  Diagnosis Date  . Abscess   . Anxiety    no meds  . HSV-2 infection   . Medical history non-contributory     Patient Active Problem List   Diagnosis Date Noted  . Decreased fetal movement     Past Surgical History:  Procedure Laterality Date  . CESAREAN SECTION     twin IUP with PreE  . WISDOM TOOTH EXTRACTION       OB History     Gravida  2   Para  2   Term  1   Preterm  1   AB      Living  3      SAB      IAB      Ectopic      Multiple  1   Live Births  3           Family History  Problem Relation Age of Onset  . Heart disease Father     Social History   Tobacco Use  . Smoking status: Every Day    Packs/day: 0.25    Types: Cigarettes  . Smokeless tobacco: Never  Vaping Use  . Vaping Use: Never used  Substance Use Topics  . Alcohol use: No  . Drug use: No    Home Medications Prior to Admission medications   Medication Sig Start Date End Date  Taking? Authorizing Provider  amoxicillin (AMOXIL) 500 MG capsule Take 1 capsule (500 mg total) by mouth 3 (three) times daily. 12/17/19   Roxy Horseman, PA-C  BIOTIN PO Take 1 tablet by mouth daily.    [provider]  cyclobenzaprine (FLEXERIL) 5 MG tablet Take 1 tablet (5 mg total) by mouth 3 (three) times daily as needed for muscle spasms. 11/07/17   Burgess Amor, PA-C  HYDROcodone-acetaminophen (NORCO/VICODIN) 5-325 MG tablet Take 1 tablet by mouth every 6 (six) hours as needed. 01/08/20   Khatri, Hina, PA-C  ibuprofen (ADVIL,MOTRIN) 600 MG tablet Take 1 tablet (600 mg total) by mouth every 6 (six) hours as needed. 11/07/17   Burgess Amor, PA-C  methocarbamol (ROBAXIN) 500 MG tablet Take 1 tablet (500 mg total) by mouth every 8 (eight) hours as needed for muscle spasms. 01/01/20   Cristina Gong, PA-C  metoCLOPramide (REGLAN) 10 MG tablet Take 1 tablet (10 mg total) by mouth every 6 (six) hours. 07/12/18   Aviva Kluver  B, PA-C  metroNIDAZOLE (FLAGYL) 500 MG tablet Take 1 tablet (500 mg total) by mouth 2 (two) times daily. 07/29/19   Maxwell Caul, PA-C  ondansetron (ZOFRAN ODT) 4 MG disintegrating tablet Take 1 tablet (4 mg total) by mouth every 8 (eight) hours as needed for nausea or vomiting. 01/08/20   Dietrich Pates, PA-C    Allergies    Patient has no known allergies.  Review of Systems   Review of Systems  Constitutional:  Negative for chills and fever.  HENT:  Positive for sore throat. Negative for ear discharge, ear pain and trouble swallowing.   Respiratory:  Negative for shortness of breath.   Cardiovascular:  Negative for chest pain and palpitations.  Gastrointestinal:  Positive for nausea and vomiting. Negative for abdominal distention and abdominal pain.  Genitourinary:  Negative for dysuria, urgency and vaginal discharge.  Skin:  Negative for color change and pallor.  Neurological:  Negative for dizziness, syncope and headaches.  Psychiatric/Behavioral:   The patient is nervous/anxious.   All other systems reviewed and are negative.  Physical Exam Updated Vital Signs BP (!) 114/95 (BP Location: Right Arm)   Pulse 86   Temp 98.2 F (36.8 C) (Oral)   Resp 17   Ht 5\' 9"  (1.753 m)   Wt 124.7 kg   LMP 10/22/2020   SpO2 100%   BMI 40.61 kg/m   Physical Exam Vitals and nursing note reviewed.  Constitutional:      Appearance: Normal appearance. She is not ill-appearing.  HENT:     Head: Normocephalic and atraumatic.     Right Ear: Tympanic membrane normal.     Left Ear: Tympanic membrane normal.     Nose: No congestion.     Mouth/Throat:     Mouth: Mucous membranes are moist.     Pharynx: Uvula midline. No uvula swelling.     Tonsils: No tonsillar exudate or tonsillar abscesses. 2+ on the right. 1+ on the left.  Eyes:     General: No scleral icterus.    Conjunctiva/sclera: Conjunctivae normal.     Pupils: Pupils are equal, round, and reactive to light.  Cardiovascular:     Rate and Rhythm: Normal rate and regular rhythm.  Pulmonary:     Effort: Pulmonary effort is normal. No respiratory distress.  Abdominal:     Palpations: Abdomen is soft.     Tenderness: There is no abdominal tenderness.  Skin:    General: Skin is warm and dry.     Findings: No rash.  Neurological:     Mental Status: She is alert.  Psychiatric:        Mood and Affect: Mood normal.    ED Results / Procedures / Treatments   Labs (all labs ordered are listed, but only abnormal results are displayed) Labs Reviewed  BASIC METABOLIC PANEL - Abnormal; Notable for the following components:      Result Value   Sodium 134 (*)    Calcium 8.8 (*)    All other components within normal limits  RESP PANEL BY RT-PCR (FLU A&B, COVID) ARPGX2  GROUP A STREP BY PCR  CBC  URINALYSIS, ROUTINE W REFLEX MICROSCOPIC  MONONUCLEOSIS SCREEN  I-STAT BETA HCG BLOOD, ED ([facility], [facility], [facility] ONLY)    EKG EKG Interpretation  Date/Time:  Monday October 23 2020 13:41:26  EDT Ventricular Rate:  71 PR Interval:  154 QRS Duration: 90 QT Interval:  376 QTC Calculation: 408 R Axis:   66 Text Interpretation: Normal sinus  rhythm Normal ECG Confirmed by Benjiman Core [phone]) on 10/23/2020 3:26:39 PM  Radiology DG Chest 2 View  Result Date: 10/23/2020 CLINICAL DATA:  Difficulty breathing. Chest and rib pain for 2 days. EXAM: CHEST - 2 VIEW COMPARISON:  One-view chest x-ray 03/03/2019 FINDINGS: The heart size and mediastinal contours are within normal limits. Both lungs are clear. The visualized skeletal structures are unremarkable. IMPRESSION: No active cardiopulmonary disease. Electronically Signed   By: Marin Roberts M.D.   On: 10/23/2020 13:44    Procedures Procedures   Medications Ordered in ED Medications  ondansetron (ZOFRAN) injection 4 mg (has no administration in time range)  ondansetron (ZOFRAN-ODT) disintegrating tablet 4 mg (4 mg Oral Given 10/23/20 1214)    ED Course  I have reviewed the triage vital signs and the nursing notes.  Pertinent labs & imaging results that were available during my care of the patient were reviewed by me and considered in my medical decision making (see chart for details).    MDM Rules/Calculators/A&P Anniece Bleiler is a 25 y.o. female presenting with a complaint of 3 months of tonsil swelling.  She reports that her right ear has begun to bother her" feels like there is water in it."  Endorses associated body aches, chest congestion and difficulty taking a deep breath.  Denies any fevers, chills, abdominal pain, leg swelling or OCP use.  No cough.  No known sick contacts.  Reports 1 episode of emesis 3 days ago but reports some nausea.  LMP 3 days ago.   Patient reported that she has not vomited since 3 days ago however throughout her stay today she had multiple episodes of emesis.  She was given 2 doses of Zofran and continued to feel nauseated and vomit.  I then gave the patient 10 mg Reglan which made her feel  better.  Because of the chronic nature of her symptoms I tested the patient for mono.  Mono test was negative strep and COVID.  Blood work unremarkable and no sign of UTI.  Because patient complained of chest pain and difficulty breathing I did obtain a chest x-ray and EKG both of which were negative.  Final Clinical Impression(s) / ED Diagnoses Final diagnoses:  Pharyngitis, unspecified etiology    Rx / DC Orders Results and diagnoses were explained to the patient. Return precautions discussed in full. Patient had no additional questions and expressed complete understanding.     Saddie Benders, PA-C 10/24/20 1000    Benjiman Core, MD 10/25/20 1325

## 2020-10-23 NOTE — ED Triage Notes (Signed)
Pt reports swollen tonsils for the past 3 months, ear pain, generalized body aches and chest congestion. Pt maintaining secretions. Airway intact

## 2020-10-23 NOTE — Discharge Instructions (Addendum)
Return to the emergency department if your symptoms worsen you struggle to catch her breath, have pain in your chest, spiking fever, or have difficulty swallowing.

## 2021-01-14 ENCOUNTER — Other Ambulatory Visit: Payer: Self-pay

## 2021-01-14 ENCOUNTER — Emergency Department (HOSPITAL_COMMUNITY)
Admission: EM | Admit: 2021-01-14 | Discharge: 2021-01-14 | Disposition: A | Discharge status: Home / Self Care | Payer: Medicaid Other | Attending: Emergency Medicine | Admitting: Emergency Medicine

## 2021-01-14 DIAGNOSIS — J101 Influenza due to other identified influenza virus with other respiratory manifestations: Secondary | ICD-10-CM | POA: Insufficient documentation

## 2021-01-14 DIAGNOSIS — Z5321 Procedure and treatment not carried out due to patient leaving prior to being seen by health care provider: Secondary | ICD-10-CM | POA: Insufficient documentation

## 2021-01-14 DIAGNOSIS — Z20822 Contact with and (suspected) exposure to covid-19: Secondary | ICD-10-CM | POA: Insufficient documentation

## 2021-01-14 LAB — URINALYSIS, ROUTINE W REFLEX MICROSCOPIC
Bilirubin Urine: NEGATIVE
Glucose, UA: NEGATIVE mg/dL
Ketones, ur: 20 mg/dL — AB
Leukocytes,Ua: NEGATIVE
Nitrite: NEGATIVE
Protein, ur: 30 mg/dL — AB
Specific Gravity, Urine: 1.034 — ABNORMAL HIGH (ref 1.005–1.030)
pH: 5 (ref 5.0–8.0)

## 2021-01-14 LAB — RESP PANEL BY RT-PCR (FLU A&B, COVID) ARPGX2
Influenza A by PCR: POSITIVE — AB
Influenza B by PCR: NEGATIVE
SARS Coronavirus 2 by RT PCR: NEGATIVE

## 2021-01-14 LAB — LIPASE, BLOOD: Lipase: 22 U/L (ref 11–51)

## 2021-01-14 LAB — CBC WITH DIFFERENTIAL/PLATELET
Abs Immature Granulocytes: 0.02 10*3/uL (ref 0.00–0.07)
Basophils Absolute: 0 10*3/uL (ref 0.0–0.1)
Basophils Relative: 0 %
Eosinophils Absolute: 0 10*3/uL (ref 0.0–0.5)
Eosinophils Relative: 0 %
HCT: 38.9 % (ref 36.0–46.0)
Hemoglobin: 12.9 g/dL (ref 12.0–15.0)
Immature Granulocytes: 0 %
Lymphocytes Relative: 14 %
Lymphs Abs: 0.8 10*3/uL (ref 0.7–4.0)
MCH: 28.4 pg (ref 26.0–34.0)
MCHC: 33.2 g/dL (ref 30.0–36.0)
MCV: 85.7 fL (ref 80.0–100.0)
Monocytes Absolute: 0.7 10*3/uL (ref 0.1–1.0)
Monocytes Relative: 12 %
Neutro Abs: 4.3 10*3/uL (ref 1.7–7.7)
Neutrophils Relative %: 74 %
Platelets: 292 10*3/uL (ref 150–400)
RBC: 4.54 MIL/uL (ref 3.87–5.11)
RDW: 13.1 % (ref 11.5–15.5)
WBC: 5.8 10*3/uL (ref 4.0–10.5)
nRBC: 0 % (ref 0.0–0.2)

## 2021-01-14 LAB — COMPREHENSIVE METABOLIC PANEL
ALT: 13 U/L (ref 0–44)
AST: 18 U/L (ref 15–41)
Albumin: 3.4 g/dL — ABNORMAL LOW (ref 3.5–5.0)
Alkaline Phosphatase: 42 U/L (ref 38–126)
Anion gap: 9 (ref 5–15)
BUN: 8 mg/dL (ref 6–20)
CO2: 20 mmol/L — ABNORMAL LOW (ref 22–32)
Calcium: 8.8 mg/dL — ABNORMAL LOW (ref 8.9–10.3)
Chloride: 103 mmol/L (ref 98–111)
Creatinine, Ser: 0.94 mg/dL (ref 0.44–1.00)
GFR, Estimated: >60 mL/min (ref >60)
Glucose, Bld: 121 mg/dL — ABNORMAL HIGH (ref 70–99)
Potassium: 3.5 mmol/L (ref 3.5–5.1)
Sodium: 132 mmol/L — ABNORMAL LOW (ref 135–145)
Total Bilirubin: 0.4 mg/dL (ref 0.3–1.2)
Total Protein: 7.9 g/dL (ref 6.5–8.1)

## 2021-01-14 LAB — I-STAT BETA HCG BLOOD, ED (MC, WL, AP ONLY): I-stat hCG, quantitative: <5 m[IU]/mL (ref <5)

## 2021-01-14 MED ORDER — ACETAMINOPHEN 500 MG PO TABS: 1000.0000 mg | ORAL_TABLET | Freq: Once | ORAL | Status: DC

## 2021-01-14 MED ORDER — ONDANSETRON 4 MG PO TBDP
4.0000 mg | ORAL_TABLET | Freq: Once | ORAL | Status: AC
  Administered 2021-01-14: 01:00:00 4 mg via ORAL
  Filled 2021-01-14: qty 1

## 2021-01-14 NOTE — ED Notes (Signed)
PT called X5 no answer. 

## 2021-01-14 NOTE — ED Provider Notes (Signed)
Emergency Medicine Provider Triage Evaluation Note  Amy Downs , a 25 y.o. female  was evaluated in triage.  Pt complains of nausea, vomiting, and generally feeling unwell.  She reports diffuse abdominal pain, worse after she vomits.  No diarrhea.  States not able to eat for about 48 hours.  Febrile in triage.  Unsure of sick contacts..  Review of Systems  Positive: Nausea, vomiting, abdominal pain Negative: diarrhea  Physical Exam  BP 104/77 (BP Location: Left Arm)   Pulse (!) 116   Temp (!) 100.8 F (38.2 C)   Resp 20   Ht 5\' 9"  (1.753 m)   Wt 122.5 kg   SpO2 100%   BMI 39.87 kg/m  Gen:   Awake, no distress, moaning Resp:  Normal effort  MSK:   Moves extremities without difficulty  Other:  Generalized abdominal tenderness, dry heaving  Medical Decision Making  Medically screening exam initiated at 12:34 AM.  Appropriate orders placed.  Kaja Godown was informed that the remainder of the evaluation will be completed by another provider, this initial triage assessment does not replace that evaluation, and the importance of remaining in the ED until their evaluation is complete.  Febrile in triage but overall non-toxic.  Labs sent along with flu/covid screen.  Zofran and tylenol ordered.   , PA-C 01/14/21 0038    01/16/21, MD 01/14/21 [phone]

## 2021-01-14 NOTE — ED Triage Notes (Signed)
Pt c/o abd pain and nausea and vomiting.

## 2021-01-15 ENCOUNTER — Other Ambulatory Visit: Payer: Self-pay

## 2021-01-15 ENCOUNTER — Emergency Department (HOSPITAL_COMMUNITY)
Admission: EM | Admit: 2021-01-15 | Discharge: 2021-01-16 | Disposition: A | Discharge status: Home / Self Care | Payer: Medicaid Other | Attending: Emergency Medicine | Admitting: Emergency Medicine

## 2021-01-15 ENCOUNTER — Encounter (HOSPITAL_COMMUNITY): Payer: Self-pay | Admitting: Emergency Medicine

## 2021-01-15 DIAGNOSIS — J101 Influenza due to other identified influenza virus with other respiratory manifestations: Secondary | ICD-10-CM | POA: Insufficient documentation

## 2021-01-15 DIAGNOSIS — R109 Unspecified abdominal pain: Secondary | ICD-10-CM | POA: Insufficient documentation

## 2021-01-15 DIAGNOSIS — Z20822 Contact with and (suspected) exposure to covid-19: Secondary | ICD-10-CM | POA: Insufficient documentation

## 2021-01-15 DIAGNOSIS — Z5321 Procedure and treatment not carried out due to patient leaving prior to being seen by health care provider: Secondary | ICD-10-CM | POA: Insufficient documentation

## 2021-01-15 DIAGNOSIS — E871 Hypo-osmolality and hyponatremia: Secondary | ICD-10-CM | POA: Insufficient documentation

## 2021-01-15 LAB — COMPREHENSIVE METABOLIC PANEL
ALT: 16 U/L (ref 0–44)
AST: 23 U/L (ref 15–41)
Albumin: 3.7 g/dL (ref 3.5–5.0)
Alkaline Phosphatase: 39 U/L (ref 38–126)
Anion gap: 10 (ref 5–15)
BUN: 11 mg/dL (ref 6–20)
CO2: 21 mmol/L — ABNORMAL LOW (ref 22–32)
Calcium: 8.7 mg/dL — ABNORMAL LOW (ref 8.9–10.3)
Chloride: 104 mmol/L (ref 98–111)
Creatinine, Ser: 0.96 mg/dL (ref 0.44–1.00)
GFR, Estimated: >60 mL/min (ref >60)
Glucose, Bld: 118 mg/dL — ABNORMAL HIGH (ref 70–99)
Potassium: 3.1 mmol/L — ABNORMAL LOW (ref 3.5–5.1)
Sodium: 135 mmol/L (ref 135–145)
Total Bilirubin: 0.4 mg/dL (ref 0.3–1.2)
Total Protein: 8.1 g/dL (ref 6.5–8.1)

## 2021-01-15 LAB — URINALYSIS, ROUTINE W REFLEX MICROSCOPIC
Bacteria, UA: NONE SEEN
Bilirubin Urine: NEGATIVE
Glucose, UA: NEGATIVE mg/dL
Ketones, ur: 5 mg/dL — AB
Leukocytes,Ua: NEGATIVE
Nitrite: NEGATIVE
Protein, ur: 30 mg/dL — AB
Specific Gravity, Urine: 1.03 (ref 1.005–1.030)
pH: 5 (ref 5.0–8.0)

## 2021-01-15 LAB — RESP PANEL BY RT-PCR (FLU A&B, COVID) ARPGX2
Influenza A by PCR: POSITIVE — AB
Influenza B by PCR: NEGATIVE
SARS Coronavirus 2 by RT PCR: NEGATIVE

## 2021-01-15 LAB — CBC
HCT: 41.6 % (ref 36.0–46.0)
Hemoglobin: 13.8 g/dL (ref 12.0–15.0)
MCH: 28.4 pg (ref 26.0–34.0)
MCHC: 33.2 g/dL (ref 30.0–36.0)
MCV: 85.6 fL (ref 80.0–100.0)
Platelets: 282 10*3/uL (ref 150–400)
RBC: 4.86 MIL/uL (ref 3.87–5.11)
RDW: 13.2 % (ref 11.5–15.5)
WBC: 3.6 10*3/uL — ABNORMAL LOW (ref 4.0–10.5)
nRBC: 0 % (ref 0.0–0.2)

## 2021-01-15 LAB — LIPASE, BLOOD: Lipase: 24 U/L (ref 11–51)

## 2021-01-15 MED ORDER — IBUPROFEN 200 MG PO TABS
600.0000 mg | ORAL_TABLET | Freq: Once | ORAL | Status: AC
  Administered 2021-01-15: 22:00:00 600 mg via ORAL
  Filled 2021-01-15: qty 3

## 2021-01-15 MED ORDER — ONDANSETRON 4 MG PO TBDP
4.0000 mg | ORAL_TABLET | Freq: Once | ORAL | Status: AC | PRN
  Administered 2021-01-15: 22:00:00 4 mg via ORAL
  Filled 2021-01-15: qty 1

## 2021-01-15 NOTE — ED Provider Notes (Signed)
Emergency Medicine Provider Triage Evaluation Note  Amy Downs , a 25 y.o. female  was evaluated in triage.  Pt complains of abdominal pain, nausea vomiting of 3-day duration.  Patient per chart review was seen yesterday but left prior to being seen.  She did have blood work done yesterday which was significant for mild hyponatremia, positive for influenza A.  Review of Systems  Positive: Nausea, vomiting, abdominal pain, fever Negative: Chest pain, shortness of breath  Physical Exam  BP 97/74 Comment: movement  Pulse (!) 112   Temp 98.8 F (37.1 C) (Oral) Comment: unable to collect temp  Resp 20   LMP 01/10/2021   SpO2 99%  Gen:   Awake, appears uncomfortable Resp:  Normal effort  MSK:   Moves extremities without difficulty  Other:    Medical Decision Making  Medically screening exam initiated at 9:39 PM.  Appropriate orders placed.  Marjean Blandino was informed that the remainder of the evaluation will be completed by another provider, this initial triage assessment does not replace that evaluation, and the importance of remaining in the ED until their evaluation is complete.     Marita [place], PA-C 01/15/21 2140    Terald Sleeper, MD 01/16/21 0001

## 2022-02-07 ENCOUNTER — Emergency Department (HOSPITAL_COMMUNITY)
Admission: EM | Admit: 2022-02-07 | Discharge: 2022-02-07 | Discharge status: Against Medical Advice | Payer: Medicaid Other | Attending: Emergency Medicine | Admitting: Emergency Medicine

## 2022-02-07 DIAGNOSIS — Z1152 Encounter for screening for COVID-19: Secondary | ICD-10-CM | POA: Diagnosis not present

## 2022-02-07 DIAGNOSIS — Z5321 Procedure and treatment not carried out due to patient leaving prior to being seen by health care provider: Secondary | ICD-10-CM | POA: Insufficient documentation

## 2022-02-07 DIAGNOSIS — M791 Myalgia, unspecified site: Secondary | ICD-10-CM | POA: Insufficient documentation

## 2022-02-07 DIAGNOSIS — R509 Fever, unspecified: Secondary | ICD-10-CM | POA: Diagnosis not present

## 2022-02-07 DIAGNOSIS — R1110 Vomiting, unspecified: Secondary | ICD-10-CM | POA: Diagnosis present

## 2022-02-07 DIAGNOSIS — R5381 Other malaise: Secondary | ICD-10-CM | POA: Insufficient documentation

## 2022-02-07 LAB — COMPREHENSIVE METABOLIC PANEL
ALT: 10 U/L (ref 0–44)
AST: 18 U/L (ref 15–41)
Albumin: 3.5 g/dL (ref 3.5–5.0)
Alkaline Phosphatase: 46 U/L (ref 38–126)
Anion gap: 7 (ref 5–15)
BUN: 9 mg/dL (ref 6–20)
CO2: 22 mmol/L (ref 22–32)
Calcium: 9 mg/dL (ref 8.9–10.3)
Chloride: 103 mmol/L (ref 98–111)
Creatinine, Ser: 1.01 mg/dL — ABNORMAL HIGH (ref 0.44–1.00)
GFR, Estimated: >60 mL/min (ref >60)
Glucose, Bld: 101 mg/dL — ABNORMAL HIGH (ref 70–99)
Potassium: 3.7 mmol/L (ref 3.5–5.1)
Sodium: 132 mmol/L — ABNORMAL LOW (ref 135–145)
Total Bilirubin: 0.4 mg/dL (ref 0.3–1.2)
Total Protein: 7.7 g/dL (ref 6.5–8.1)

## 2022-02-07 LAB — LIPASE, BLOOD: Lipase: 26 U/L (ref 11–51)

## 2022-02-07 LAB — CBC
HCT: 39.2 % (ref 36.0–46.0)
Hemoglobin: 12.8 g/dL (ref 12.0–15.0)
MCH: 28.4 pg (ref 26.0–34.0)
MCHC: 32.7 g/dL (ref 30.0–36.0)
MCV: 86.9 fL (ref 80.0–100.0)
Platelets: 276 10*3/uL (ref 150–400)
RBC: 4.51 MIL/uL (ref 3.87–5.11)
RDW: 13.4 % (ref 11.5–15.5)
WBC: 6.4 10*3/uL (ref 4.0–10.5)
nRBC: 0 % (ref 0.0–0.2)

## 2022-02-07 LAB — RESP PANEL BY RT-PCR (RSV, FLU A&B, COVID)  RVPGX2
Influenza A by PCR: NEGATIVE
Influenza B by PCR: NEGATIVE
Resp Syncytial Virus by PCR: NEGATIVE
SARS Coronavirus 2 by RT PCR: NEGATIVE

## 2022-02-07 LAB — URINALYSIS, ROUTINE W REFLEX MICROSCOPIC
Bilirubin Urine: NEGATIVE
Glucose, UA: NEGATIVE mg/dL
Hgb urine dipstick: NEGATIVE
Ketones, ur: 80 mg/dL — AB
Leukocytes,Ua: NEGATIVE
Nitrite: NEGATIVE
Protein, ur: NEGATIVE mg/dL
Specific Gravity, Urine: 1.021 (ref 1.005–1.030)
pH: 7 (ref 5.0–8.0)

## 2022-02-07 LAB — I-STAT BETA HCG BLOOD, ED (MC, WL, AP ONLY): I-stat hCG, quantitative: <5 m[IU]/mL (ref <5)

## 2022-02-07 MED ORDER — ONDANSETRON 4 MG PO TBDP
4.0000 mg | ORAL_TABLET | Freq: Once | ORAL | Status: AC
  Administered 2022-02-07: 4 mg via ORAL
  Filled 2022-02-07: qty 1

## 2022-02-07 NOTE — ED Notes (Signed)
I was ask by reg to take pt out. They have been calling pt and unable to find. I also called pt and no luck

## 2022-02-07 NOTE — ED Triage Notes (Signed)
Patient here with complaint of emesis that started this morning and bilateral arm tingling. Patient is alert, oriented, ambulating independently with steady gait.

## 2022-02-07 NOTE — ED Provider Triage Note (Signed)
Emergency Medicine Provider Triage Evaluation Note  Amy Downs , a 26 y.o. female  was evaluated in triage.  Pt complains of emesis, headache, chills, general malaise and myalgias.  This all started last night.  She denies any chest pain, abdominal pain currently, urinary symptoms.  Review of Systems  Positive:  Negative: See above  Physical Exam  BP (!) 131/58 (BP Location: Left Arm)   Pulse (!) 116   Temp 98 F (36.7 C) (Oral)   Resp (!) 22   SpO2 100%  Gen:   Awake, no distress   Resp:  Normal effort  MSK:   Moves extremities without difficulty  Other:    Medical Decision Making  Medically screening exam initiated at 3:22 PM.  Appropriate orders placed.  Amy Downs was informed that the remainder of the evaluation will be completed by another provider, this initial triage assessment does not replace that evaluation, and the importance of remaining in the ED until their evaluation is complete.     Honor Loh Pace, [place] 02/07/22 1528

## 2022-04-01 ENCOUNTER — Encounter: Payer: Self-pay | Admitting: Obstetrics and Gynecology

## 2022-04-01 ENCOUNTER — Encounter: Payer: Medicaid Other | Admitting: Obstetrics and Gynecology

## 2022-04-02 NOTE — Progress Notes (Signed)
Patient did not keep her GYN appointment for 04/01/2022.  Durene Romans MD Attending Center for [employer] [facility])

## 2022-05-18 ENCOUNTER — Encounter (HOSPITAL_COMMUNITY): Payer: Self-pay

## 2022-05-18 ENCOUNTER — Ambulatory Visit (HOSPITAL_COMMUNITY)
Admission: EM | Admit: 2022-05-18 | Discharge: 2022-05-18 | Disposition: A | Discharge status: Home / Self Care | Payer: Medicaid Other | Attending: Internal Medicine | Admitting: Internal Medicine

## 2022-05-18 DIAGNOSIS — J301 Allergic rhinitis due to pollen: Secondary | ICD-10-CM | POA: Diagnosis not present

## 2022-05-18 LAB — POCT RAPID STREP A, ED / UC: Streptococcus, Group A Screen (Direct): NEGATIVE

## 2022-05-18 MED ORDER — CETIRIZINE HCL 10 MG PO TABS: 10.0000 mg | ORAL_TABLET | Freq: Every day | ORAL | 2 refills | Status: DC

## 2022-05-18 MED ORDER — FLUTICASONE PROPIONATE 50 MCG/ACT NA SUSP: 1.0000 | Freq: Every day | NASAL | 0 refills | Status: DC

## 2022-05-18 NOTE — ED Triage Notes (Signed)
Pt is here for sore throat x 2 days. Pt was expose to strep in her household.

## 2022-05-18 NOTE — Discharge Instructions (Signed)
Your symptoms are likely due to environmental allergies.  Avoid exposure to allergens. Take oral antihistamine (Either Zyrtec, Claritin, or Allegra) and use Flonase daily as directed. You can buy these medications over the counter. These medications can take a few days to fully kick in to your body and start working. Return for any new or worsening symptoms.  If your eyes become itchy, you may purchase olopatadine (Pataday) eyedrops over the counter and use as directed to relieve watery/itchy eyes associated with allergies as well.   If your symptoms are severe, please go to the emergency room for further evaluation. Schedule an appointment with your primary care provider for follow-up and further management of your seasonal allergies as well as ongoing preventive healthcare. I hope you feel better!

## 2022-05-21 NOTE — ED Provider Notes (Signed)
[place]    CSN: [id number] Arrival date & time: 05/18/22  1716      History   Chief Complaint Chief Complaint  Patient presents with   Sore Throat    HPI Amy Downs is a 27 y.o. female.   Patient presents to urgent care for evaluation of rhinorrhea for the last 2 days. States her son has strep throat and she is "not really having any throat pain" but would like to get checked for strep just in case. No recent antibiotics or steroids. History of seasonal allergies. Denies cough, fever/chills, ear pain, headache, neck pain, nausea, vomiting, diarrhea, abdominal pain, dizziness, and generalized fatigue. She is not using any over the counter medications to help with allergy symptoms. No history of chronic respiratory problems. Current everyday cigarette smoker, denies drug use.    Sore Throat    Past Medical History:  Diagnosis Date   Abscess    Anxiety    no meds   HSV-2 infection    Medical history non-contributory     Patient Active Problem List   Diagnosis Date Noted   Decreased fetal movement     Past Surgical History:  Procedure Laterality Date   CESAREAN SECTION     twin IUP with PreE   WISDOM TOOTH EXTRACTION      OB History     Gravida  2   Para  2   Term  1   Preterm  1   AB      Living  3      SAB      IAB      Ectopic      Multiple  1   Live Births  3            Home Medications    Prior to Admission medications   Medication Sig Start Date End Date Taking? Authorizing Provider  amoxicillin (AMOXIL) 500 MG capsule Take 1 capsule (500 mg total) by mouth 3 (three) times daily. 12/17/19  Yes Montine Circle, PA-C  cetirizine (ZYRTEC) 10 MG tablet Take 1 tablet (10 mg total) by mouth daily. 05/18/22  Yes Talbot Grumbling, FNP  fluticasone (FLONASE) 50 MCG/ACT nasal spray Place 1 spray into both nostrils daily. 05/18/22  Yes Talbot Grumbling, FNP  BIOTIN PO Take 1 tablet by mouth daily.    [provider]  cyclobenzaprine (FLEXERIL) 5 MG tablet Take 1 tablet (5 mg total) by mouth 3 (three) times daily as needed for muscle spasms. 11/07/17   Evalee Jefferson, PA-C  HYDROcodone-acetaminophen (NORCO/VICODIN) 5-325 MG tablet Take 1 tablet by mouth every 6 (six) hours as needed. 01/08/20   Khatri, Hina, PA-C  ibuprofen (ADVIL,MOTRIN) 600 MG tablet Take 1 tablet (600 mg total) by mouth every 6 (six) hours as needed. 11/07/17   Evalee Jefferson, PA-C  methocarbamol (ROBAXIN) 500 MG tablet Take 1 tablet (500 mg total) by mouth every 8 (eight) hours as needed for muscle spasms. 01/01/20   Lorin Glass, PA-C  metoCLOPramide (REGLAN) 10 MG tablet Take 1 tablet (10 mg total) by mouth every 6 (six) hours. 07/12/18   Langston Masker B, PA-C  metroNIDAZOLE (FLAGYL) 500 MG tablet Take 1 tablet (500 mg total) by mouth 2 (two) times daily. 07/29/19   Volanda Napoleon, PA-C  ondansetron (ZOFRAN ODT) 4 MG disintegrating tablet Take 1 tablet (4 mg total) by mouth every 8 (eight) hours as needed for nausea or vomiting. 01/08/20   Delia Heady, PA-C  Family History Family History  Problem Relation Age of Onset   Heart disease Father     Social History Social History   Tobacco Use   Smoking status: Every Day    Packs/day: .25    Types: Cigarettes   Smokeless tobacco: Never  Vaping Use   Vaping Use: Never used  Substance Use Topics   Alcohol use: No   Drug use: No     Allergies   Patient has no known allergies.   Review of Systems Review of Systems Per HPI  Physical Exam Triage Vital Signs ED Triage Vitals [05/18/22 1813]  Enc Vitals Group     BP 115/86     Pulse Rate 100     Resp 16     Temp 99.8 F (37.7 C)     Temp Source Oral     SpO2 98 %     Weight      Height      Head Circumference      Peak Flow      Pain Score      Pain Loc      Pain Edu?      Excl. in [place]?    No data found.  Updated Vital Signs BP 115/86 (BP Location: Left Arm)   Pulse 100   Temp 99.8 F  (37.7 C) (Oral)   Resp 16   LMP 05/10/2022   SpO2 98%   Visual Acuity Right Eye Distance:   Left Eye Distance:   Bilateral Distance:    Right Eye Near:   Left Eye Near:    Bilateral Near:     Physical Exam Vitals and nursing note reviewed.  Constitutional:      Appearance: She is not ill-appearing or toxic-appearing.  HENT:     Head: Normocephalic and atraumatic.     Right Ear: Hearing, tympanic membrane, ear canal and external ear normal.     Left Ear: Hearing, tympanic membrane, ear canal and external ear normal.     Nose: Rhinorrhea present.     Mouth/Throat:     Lips: Pink.     Mouth: Mucous membranes are moist. No injury.     Tongue: No lesions. Tongue does not deviate from midline.     Palate: No mass and lesions.     Pharynx: Oropharynx is clear. Uvula midline. No pharyngeal swelling, oropharyngeal exudate, posterior oropharyngeal erythema or uvula swelling.     Tonsils: No tonsillar exudate or tonsillar abscesses.  Eyes:     General: Lids are normal. Vision grossly intact. Gaze aligned appropriately.     Extraocular Movements: Extraocular movements intact.     Conjunctiva/sclera: Conjunctivae normal.  Cardiovascular:     Rate and Rhythm: Normal rate and regular rhythm.     Heart sounds: Normal heart sounds, S1 normal and S2 normal.  Pulmonary:     Effort: Pulmonary effort is normal. No respiratory distress.     Breath sounds: Normal breath sounds and air entry.  Musculoskeletal:     Cervical back: Neck supple.  Skin:    General: Skin is warm and dry.     Capillary Refill: Capillary refill takes less than 2 seconds.     Findings: No rash.  Neurological:     General: No focal deficit present.     Mental Status: She is alert and oriented to person, place, and time. Mental status is at baseline.     Cranial Nerves: No dysarthria or facial asymmetry.  Psychiatric:  Mood and Affect: Mood normal.        Speech: Speech normal.        Behavior: Behavior  normal.        Thought Content: Thought content normal.        Judgment: Judgment normal.      UC Treatments / Results  Labs (all labs ordered are listed, but only abnormal results are displayed) Labs Reviewed  POCT RAPID STREP A, ED / UC    EKG   Radiology No results found.  Procedures Procedures (including critical care time)  Medications Ordered in UC Medications - No data to display  Initial Impression / Assessment and Plan / UC Course  I have reviewed the triage vital signs and the nursing notes.  Pertinent labs & imaging results that were available during my care of the patient were reviewed by me and considered in my medical decision making (see chart for details).   1. Seasonal allergic rhinitis Strep negative, no indication for throat culture as she does not meet [facility] criteria and has minimal throat pain. Afebrile in clinic. May use over the counter medications to help with seasonal allergies, requesting prescription. Zyrtec and flonase sent to pharmacy. Advised to follow-up with PCP as needed for further symptoms. May use olopatadine eye drops as needed for watery/itchy eyes associated with allergies. No indication for imaging today based on stable lung exam and stable vital signs. Deferred viral testing due to low suspicion for COVID-19/influenza etiology. She is agreeable with plan.  Discussed physical exam and available lab work findings in clinic with patient.  Counseled patient regarding appropriate use of medications and potential side effects for all medications recommended or prescribed today. Discussed red flag signs and symptoms of worsening condition,when to call the PCP office, return to urgent care, and when to seek higher level of care in the emergency department. Patient verbalizes understanding and agreement with plan. All questions answered. Patient discharged in stable condition.    Final Clinical Impressions(s) / UC Diagnoses   Final diagnoses:   Seasonal allergic rhinitis due to pollen     Discharge Instructions      Your symptoms are likely due to environmental allergies.  Avoid exposure to allergens. Take oral antihistamine (Either Zyrtec, Claritin, or Allegra) and use Flonase daily as directed. You can buy these medications over the counter. These medications can take a few days to fully kick in to your body and start working. Return for any new or worsening symptoms.  If your eyes become itchy, you may purchase olopatadine (Pataday) eyedrops over the counter and use as directed to relieve watery/itchy eyes associated with allergies as well.   If your symptoms are severe, please go to the emergency room for further evaluation. Schedule an appointment with your primary care provider for follow-up and further management of your seasonal allergies as well as ongoing preventive healthcare. I hope you feel better!     ED Prescriptions     Medication Sig Dispense Auth. Provider   fluticasone (FLONASE) 50 MCG/ACT nasal spray Place 1 spray into both nostrils daily. 16 g Joella Prince M, FNP   cetirizine (ZYRTEC) 10 MG tablet Take 1 tablet (10 mg total) by mouth daily. 30 tablet Talbot Grumbling, FNP      PDMP not reviewed this encounter.   Talbot Grumbling, [place] 05/21/22 1324

## 2022-05-28 ENCOUNTER — Encounter (HOSPITAL_COMMUNITY): Payer: Self-pay | Admitting: *Deleted

## 2022-05-28 ENCOUNTER — Ambulatory Visit (HOSPITAL_COMMUNITY)
Admission: EM | Admit: 2022-05-28 | Discharge: 2022-05-28 | Disposition: A | Discharge status: Home / Self Care | Payer: Medicaid Other | Attending: Family Medicine | Admitting: Family Medicine

## 2022-05-28 DIAGNOSIS — H66001 Acute suppurative otitis media without spontaneous rupture of ear drum, right ear: Secondary | ICD-10-CM

## 2022-05-28 MED ORDER — AMOXICILLIN 875 MG PO TABS: 875.0000 mg | ORAL_TABLET | Freq: Two times a day (BID) | ORAL | 0 refills | Status: AC

## 2022-05-28 MED ORDER — PREDNISONE 20 MG PO TABS
40.0000 mg | ORAL_TABLET | Freq: Every day | ORAL | 0 refills | Status: AC
Start: 2022-05-28 — End: 2022-06-02

## 2022-05-28 NOTE — Discharge Instructions (Signed)
You were seen today for an ear infection.  I have sent out amoxil to take twice/day x 10 days.  I have also sent out prednisone daily x 5 days.  You may continue zyrtec/claritin, and use motrin for any pain.

## 2022-05-28 NOTE — ED Provider Notes (Signed)
[facility]    CSN: [id number] Arrival date & time: 05/28/22  1542      History   Chief Complaint Chief Complaint  Patient presents with   Otalgia    HPI Amy Downs is a 27 y.o. female.   She was here last week with allergies.  She has been taking zyrtec, claritin for that.  She has since started with right ear pain, that goes into her head and neck.  No fevers/chills.  She continues with congestion, but no runny nose.         Past Medical History:  Diagnosis Date   Abscess    Anxiety    no meds   HSV-2 infection    Medical history non-contributory     Patient Active Problem List   Diagnosis Date Noted   Decreased fetal movement     Past Surgical History:  Procedure Laterality Date   CESAREAN SECTION     twin IUP with PreE   WISDOM TOOTH EXTRACTION      OB History     Gravida  2   Para  2   Term  1   Preterm  1   AB      Living  3      SAB      IAB      Ectopic      Multiple  1   Live Births  3            Home Medications    Prior to Admission medications   Medication Sig Start Date End Date Taking? Authorizing Provider  cetirizine (ZYRTEC) 10 MG tablet Take 1 tablet (10 mg total) by mouth daily. 05/18/22  Yes Carlisle Beers, FNP  fluticasone (FLONASE) 50 MCG/ACT nasal spray Place 1 spray into both nostrils daily. 05/18/22  Yes Carlisle Beers, FNP  amoxicillin (AMOXIL) 500 MG capsule Take 1 capsule (500 mg total) by mouth 3 (three) times daily. 12/17/19   Roxy Horseman, PA-C  BIOTIN PO Take 1 tablet by mouth daily.    [provider]  cyclobenzaprine (FLEXERIL) 5 MG tablet Take 1 tablet (5 mg total) by mouth 3 (three) times daily as needed for muscle spasms. 11/07/17   Burgess Amor, PA-C  HYDROcodone-acetaminophen (NORCO/VICODIN) 5-325 MG tablet Take 1 tablet by mouth every 6 (six) hours as needed. 01/08/20   Khatri, Hina, PA-C  ibuprofen (ADVIL,MOTRIN) 600 MG tablet Take 1 tablet (600 mg  total) by mouth every 6 (six) hours as needed. 11/07/17   Burgess Amor, PA-C  methocarbamol (ROBAXIN) 500 MG tablet Take 1 tablet (500 mg total) by mouth every 8 (eight) hours as needed for muscle spasms. 01/01/20   Cristina Gong, PA-C  metoCLOPramide (REGLAN) 10 MG tablet Take 1 tablet (10 mg total) by mouth every 6 (six) hours. 07/12/18   Aviva Kluver B, PA-C  metroNIDAZOLE (FLAGYL) 500 MG tablet Take 1 tablet (500 mg total) by mouth 2 (two) times daily. 07/29/19   Maxwell Caul, PA-C  ondansetron (ZOFRAN ODT) 4 MG disintegrating tablet Take 1 tablet (4 mg total) by mouth every 8 (eight) hours as needed for nausea or vomiting. 01/08/20   Dietrich Pates, PA-C    Family History Family History  Problem Relation Age of Onset   Heart disease Father     Social History Social History   Tobacco Use   Smoking status: Every Day    Packs/day: .25    Types: Cigarettes   Smokeless tobacco: Never  Vaping Use   Vaping Use: Never used  Substance Use Topics   Alcohol use: No   Drug use: No     Allergies   Patient has no known allergies.   Review of Systems Review of Systems  Constitutional: Negative.   HENT:  Positive for congestion and ear pain.   Respiratory: Negative.    Cardiovascular: Negative.   Gastrointestinal: Negative.   Musculoskeletal: Negative.   Psychiatric/Behavioral: Negative.       Physical Exam Triage Vital Signs ED Triage Vitals  Enc Vitals Group     BP 05/28/22 1633 102/71     Pulse Rate 05/28/22 1633 77     Resp 05/28/22 1633 18     Temp 05/28/22 1633 98.9 F (37.2 C)     Temp Source 05/28/22 1633 Oral     SpO2 05/28/22 1633 98 %     Weight --      Height --      Head Circumference --      Peak Flow --      Pain Score 05/28/22 1632 7     Pain Loc --      Pain Edu? --      Excl. in [place]? --    No data found.  Updated Vital Signs BP 102/71 (BP Location: Left Arm)   Pulse 77   Temp 98.9 F (37.2 C) (Oral)   Resp 18   LMP 05/10/2022    SpO2 98%   Visual Acuity Right Eye Distance:   Left Eye Distance:   Bilateral Distance:    Right Eye Near:   Left Eye Near:    Bilateral Near:     Physical Exam Constitutional:      Appearance: Normal appearance.  HENT:     Right Ear: A middle ear effusion is present. Tympanic membrane is erythematous and bulging.     Left Ear: A middle ear effusion is present. Tympanic membrane is not erythematous.     Nose:     Right Sinus: No maxillary sinus tenderness or frontal sinus tenderness.     Left Sinus: No maxillary sinus tenderness or frontal sinus tenderness.  Cardiovascular:     Rate and Rhythm: Normal rate and regular rhythm.  Pulmonary:     Effort: Pulmonary effort is normal.     Breath sounds: Normal breath sounds.  Neurological:     General: No focal deficit present.     Mental Status: She is alert.  Psychiatric:        Mood and Affect: Mood normal.      UC Treatments / Results  Labs (all labs ordered are listed, but only abnormal results are displayed) Labs Reviewed - No data to display  EKG   Radiology No results found.  Procedures Procedures (including critical care time)  Medications Ordered in UC Medications - No data to display  Initial Impression / Assessment and Plan / UC Course  I have reviewed the triage vital signs and the nursing notes.  Pertinent labs & imaging results that were available during my care of the patient were reviewed by me and considered in my medical decision making (see chart for details).   Final Clinical Impressions(s) / UC Diagnoses   Final diagnoses:  Non-recurrent acute suppurative otitis media of right ear without spontaneous rupture of tympanic membrane     Discharge Instructions      You were seen today for an ear infection.  I have sent out amoxil to take twice/day x  10 days.  I have also sent out prednisone daily x 5 days.  You may continue zyrtec/claritin, and use motrin for any pain.     ED  Prescriptions     Medication Sig Dispense Auth. Provider   amoxicillin (AMOXIL) 875 MG tablet Take 1 tablet (875 mg total) by mouth 2 (two) times daily for 10 days. 20 tablet Zyrell Carmean, MD   predniSONE (DELTASONE) 20 MG tablet Take 2 tablets (40 mg total) by mouth daily for 5 days. 10 tablet Jannifer Franklin, MD      PDMP not reviewed this encounter.   Jannifer Franklin, MD 05/28/22 [phone]

## 2022-05-28 NOTE — ED Triage Notes (Signed)
Pt states she was seen 05/18/2022 for sore throat and advised to take zyrtec and Flonase since she has been having right ear pain and sore throat still.

## 2022-06-24 IMAGING — CT CT HEAD W/O CM
4 series · 16 of 47 positions shown, 18 images · non-contrast
Comparison: 01/01/2020

CLINICAL DATA: Fall, hit head

EXAM:
CT HEAD WITHOUT CONTRAST
TECHNIQUE: Contiguous axial images were obtained from the base of the skull
through the vertex without intravenous contrast.

[Series 3: head wo · axial · 0.46mm/px · z∈[-128,-8]mm · 7 of 32 slices shown, 9 images]
[im 4/32  brain]
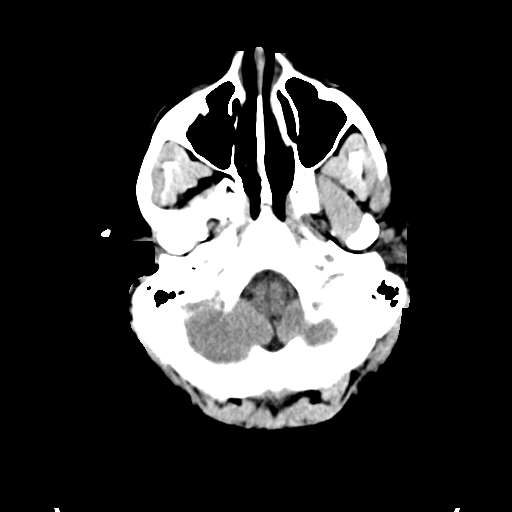
[im 4/32  bone]
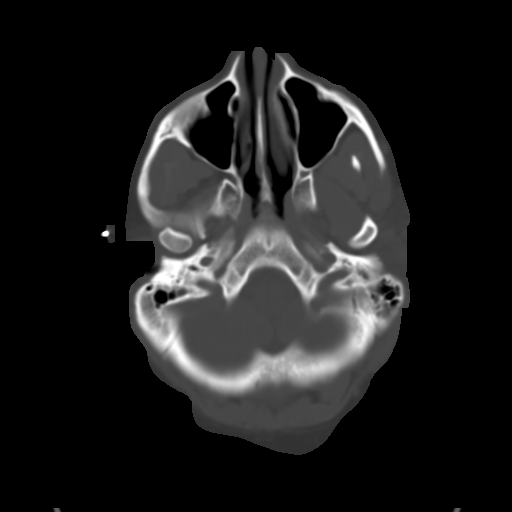
[im 8/32  brain]
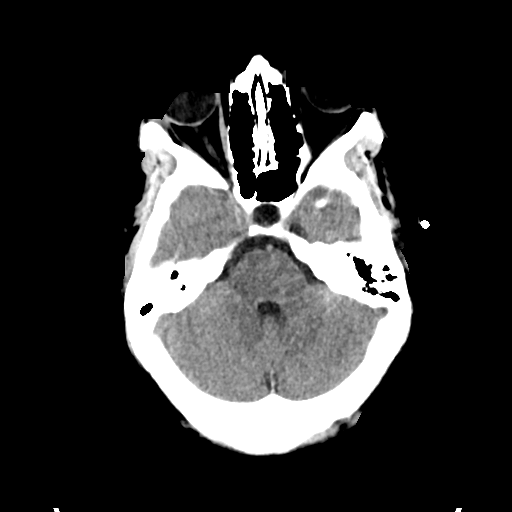
[im 12/32  brain]
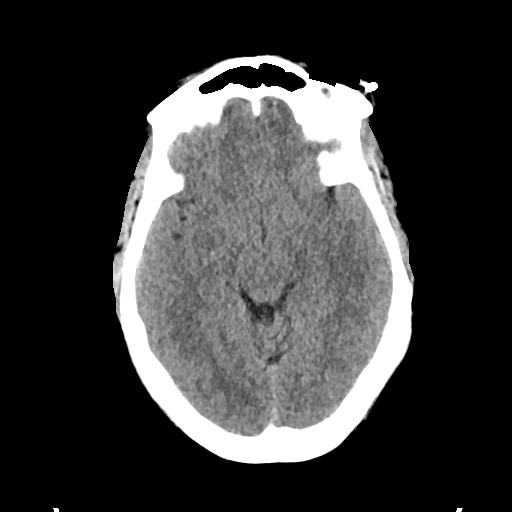
[im 16/32  brain]
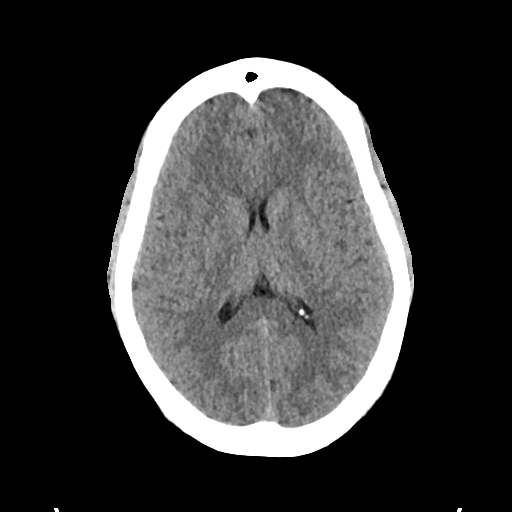
[im 20/32  brain]
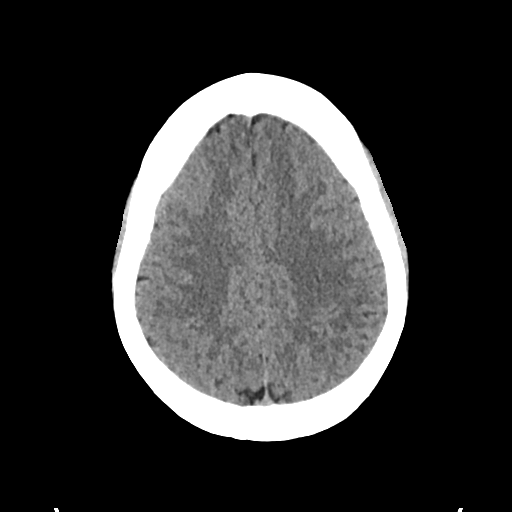
[im 20/32  bone]
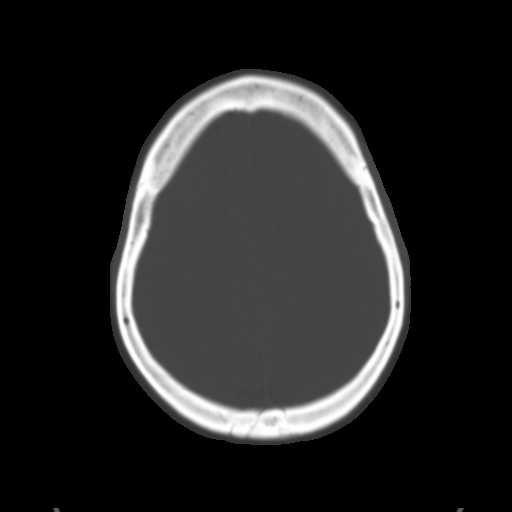
[im 24/32  brain]
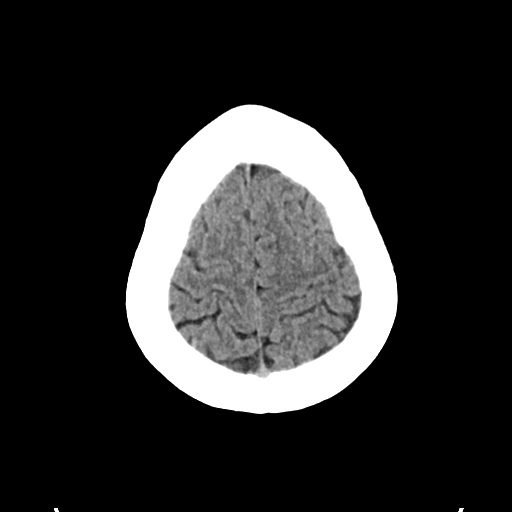
[im 28/32  brain]
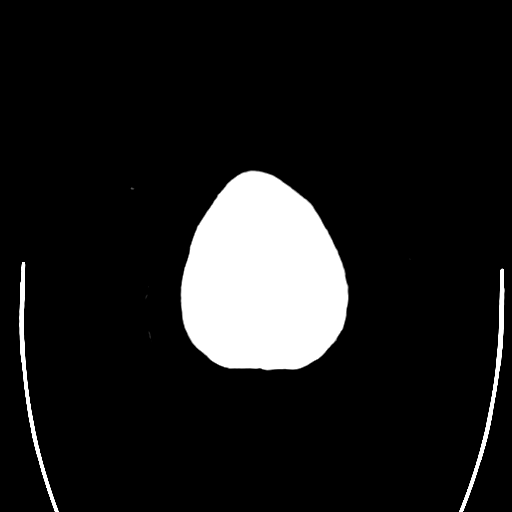

[Series 4: head bone · axial · 0.46mm/px · z∈[-130,-98]mm · 3 of 78 slices shown]
[im 8/78  bone]
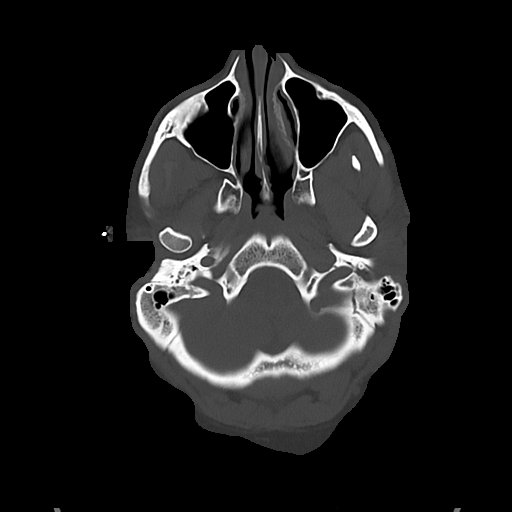
[im 16/78  bone]
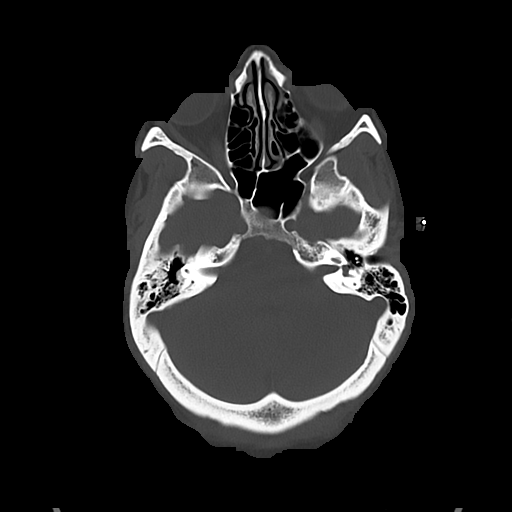
[im 24/78  bone]
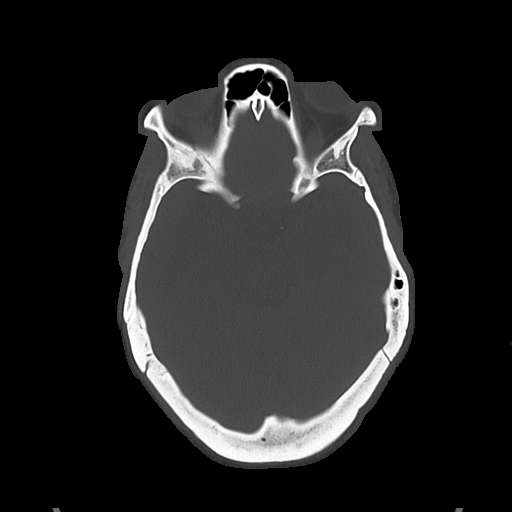

[Series 5: cor soft · coronal · 0.33mm/px · 3 of 67 slices shown]
[im 23/67  brain]
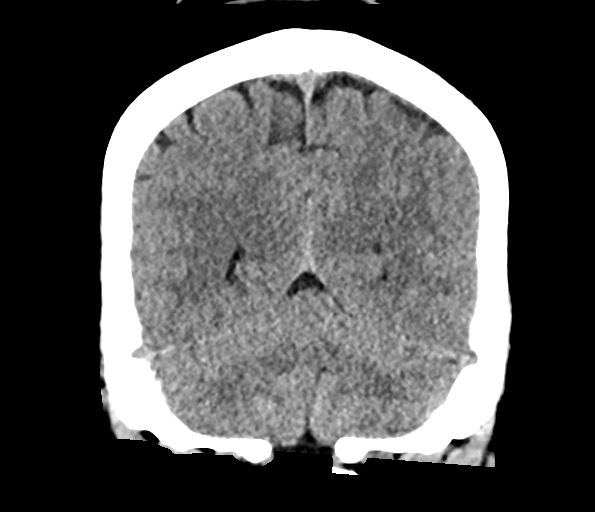
[im 30/67  brain]
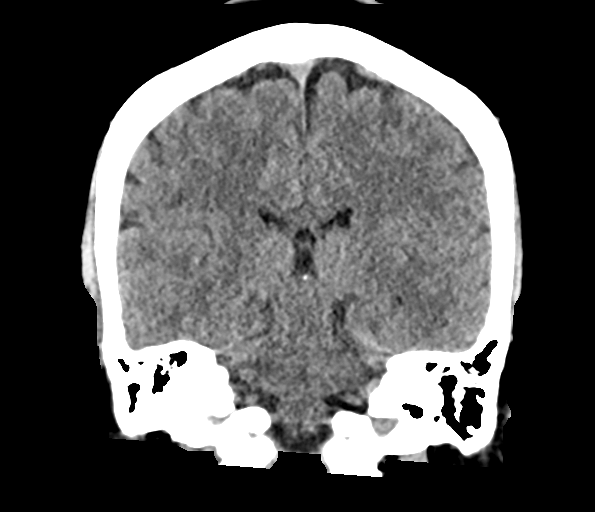
[im 37/67  brain]
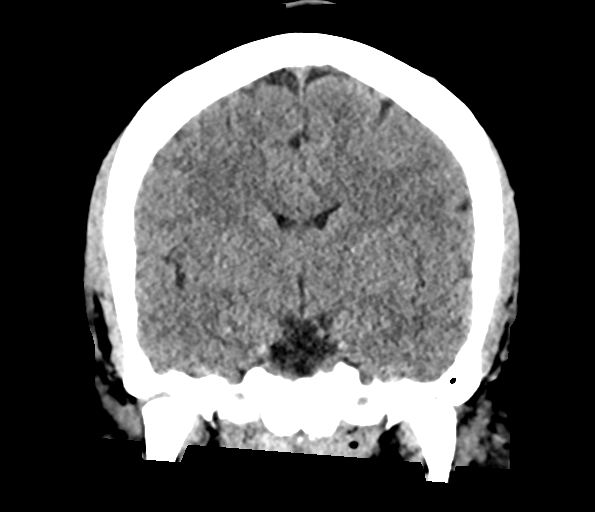

[Series 6: sag soft · sagittal · 0.35mm/px · 3 of 55 slices shown]
[im 19/55  brain]
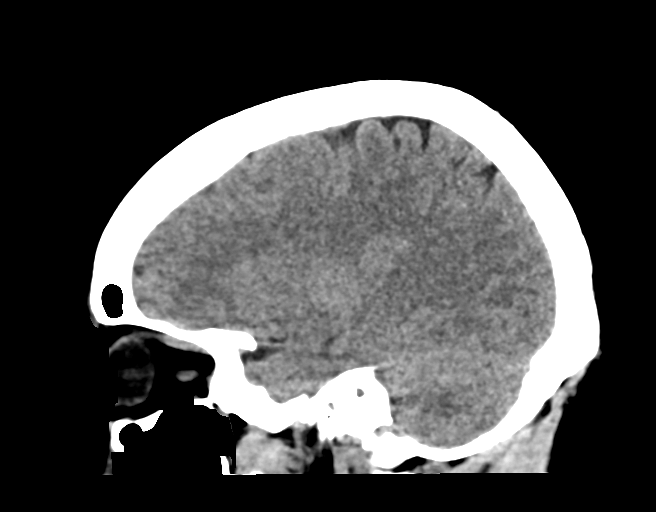
[im 28/55  brain]
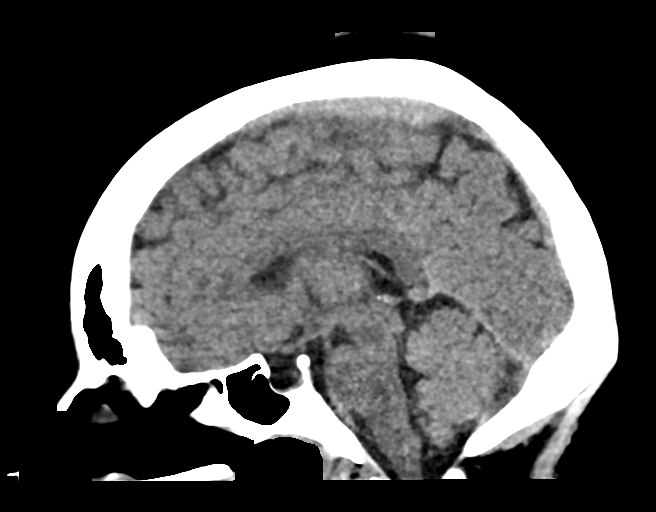
[im 37/55  brain]
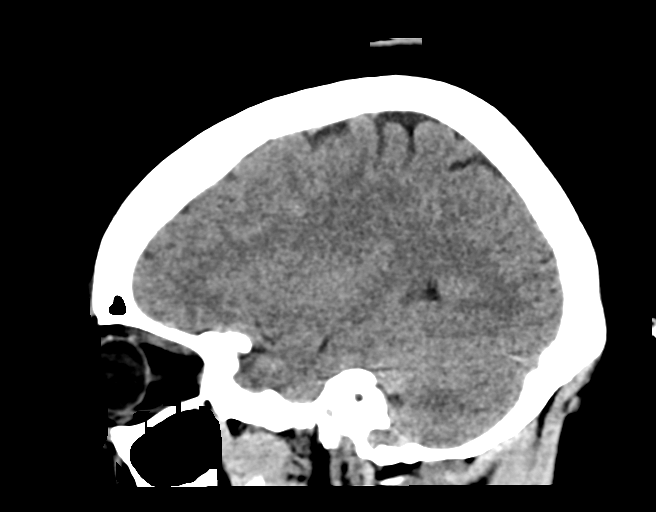

[16 of 47 positions shown; findings below may reference images not displayed]

FINDINGS: Brain: No acute intracranial abnormality. Specifically, no
hemorrhage, hydrocephalus, mass lesion, acute infarction, or
significant intracranial injury.

Vascular: No hyperdense vessel or unexpected calcification.

Skull: No acute calvarial abnormality.

Sinuses/Orbits: No acute findings

Other: None
IMPRESSION: Normal study.

## 2022-09-18 ENCOUNTER — Ambulatory Visit: Payer: Medicaid Other | Admitting: Physical Medicine and Rehabilitation

## 2022-09-26 ENCOUNTER — Ambulatory Visit: Payer: Medicaid Other | Admitting: Physical Medicine and Rehabilitation

## 2022-12-16 IMAGING — CR DG CHEST 2V
2 series · 2 of 2 positions shown · non-contrast
Comparison: One-view chest x-ray 03/03/2019

CLINICAL DATA: Difficulty breathing. Chest and rib pain for 2 days.

EXAM:
CHEST - 2 VIEW

[chest pa]
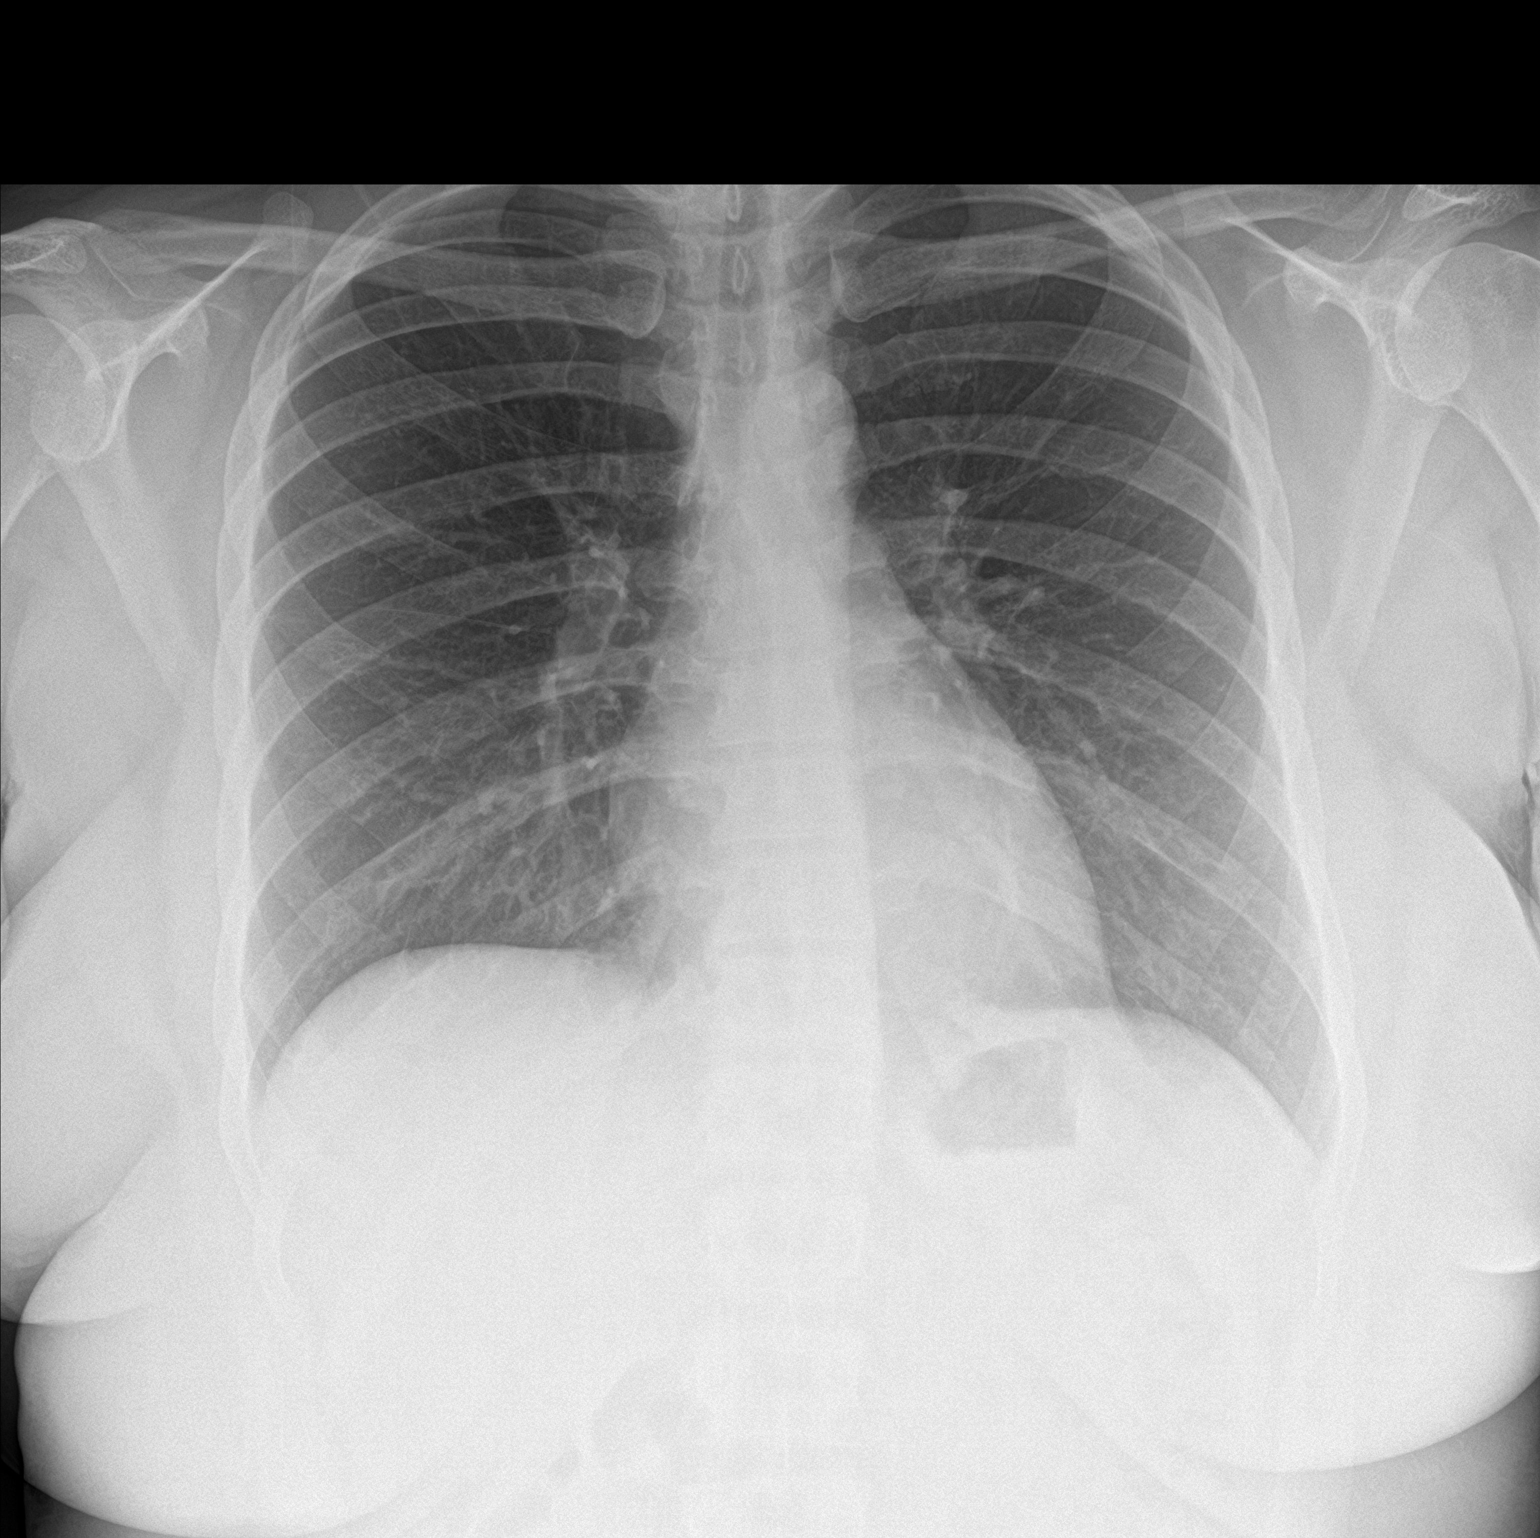

[chest lat]
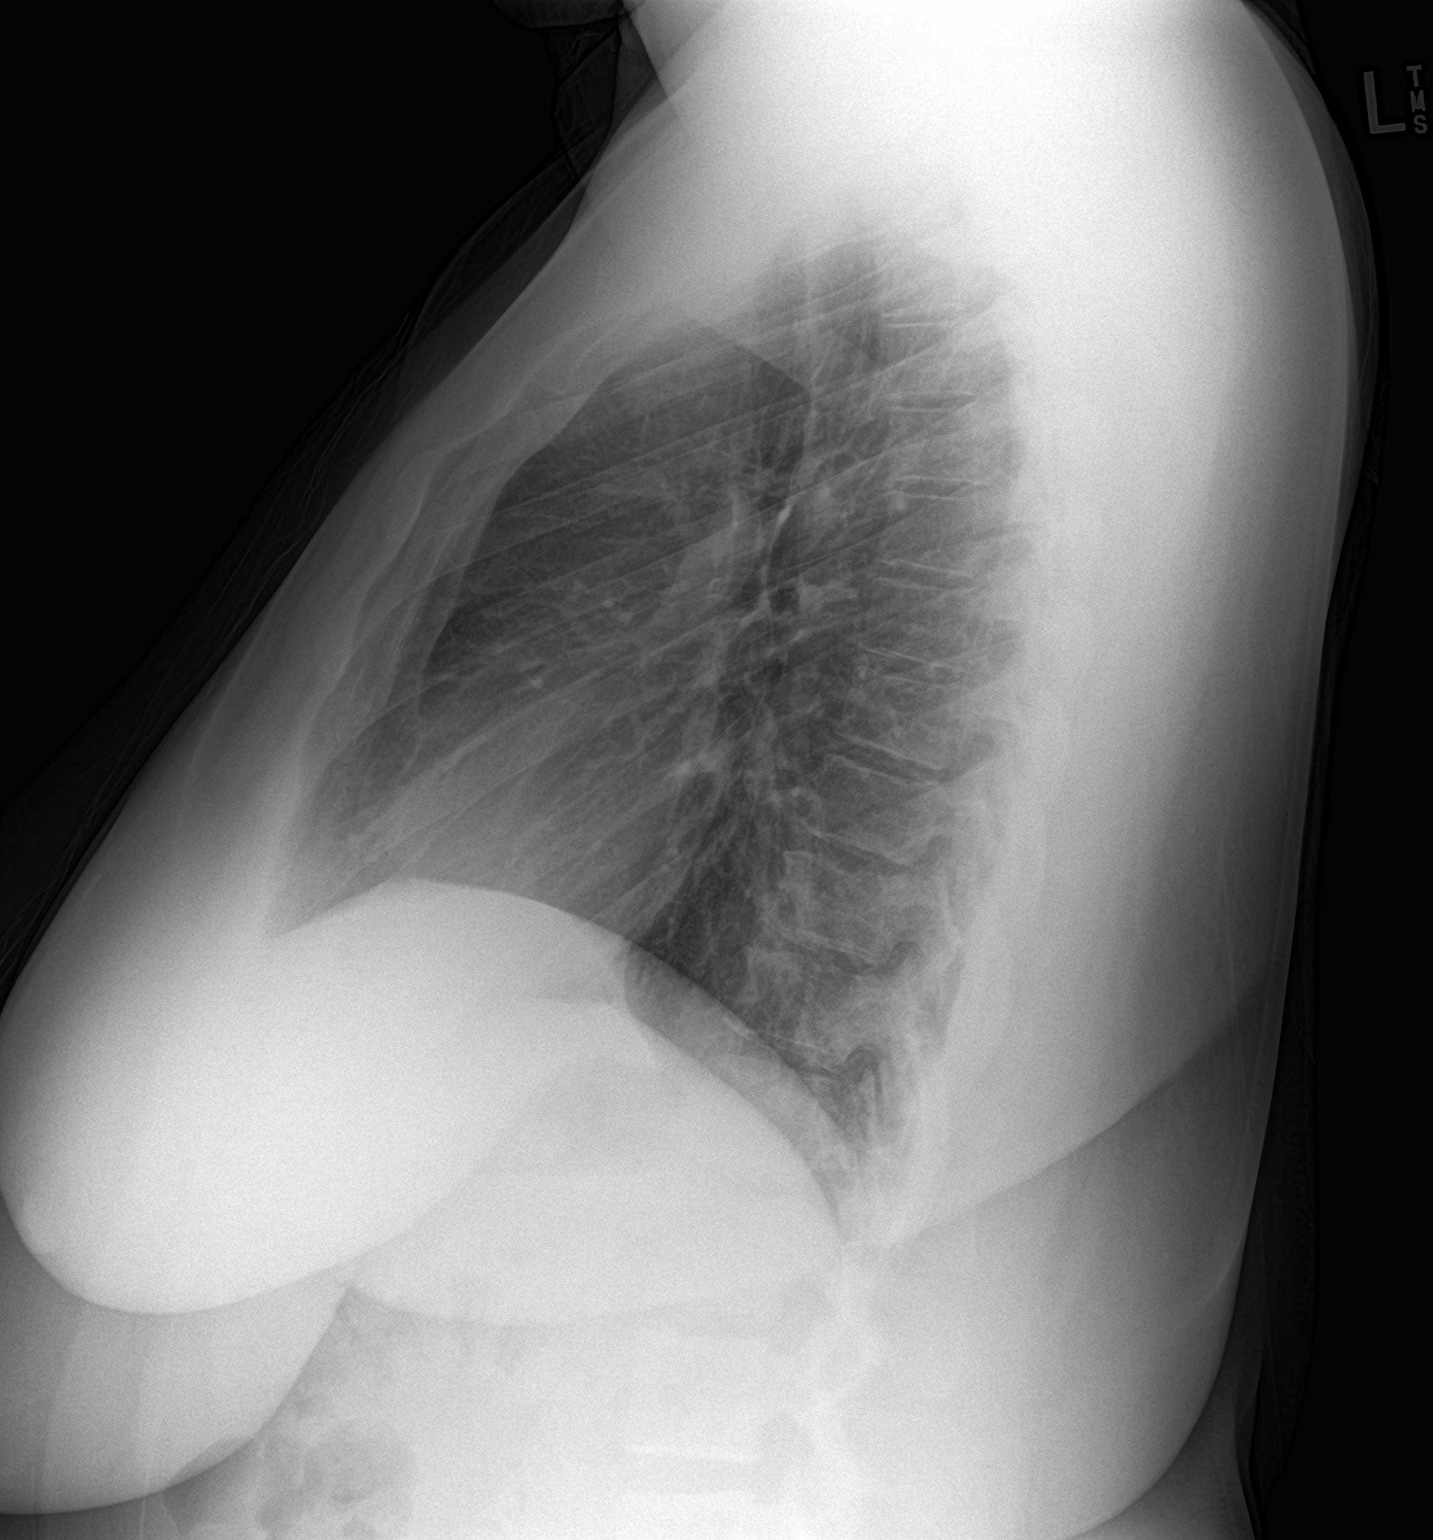

[2 of 2 positions shown; findings below may reference images not displayed]

FINDINGS: The heart size and mediastinal contours are within normal limits.
Both lungs are clear. The visualized skeletal structures are
unremarkable.
IMPRESSION: No active cardiopulmonary disease.

## 2023-01-29 ENCOUNTER — Ambulatory Visit (HOSPITAL_COMMUNITY)
Admission: EM | Admit: 2023-01-29 | Discharge: 2023-01-29 | Disposition: A | Discharge status: Home / Self Care | Payer: Medicaid Other | Attending: Family Medicine | Admitting: Family Medicine

## 2023-01-29 ENCOUNTER — Encounter (HOSPITAL_COMMUNITY): Payer: Self-pay

## 2023-01-29 DIAGNOSIS — R519 Headache, unspecified: Secondary | ICD-10-CM

## 2023-01-29 DIAGNOSIS — R0789 Other chest pain: Secondary | ICD-10-CM | POA: Diagnosis not present

## 2023-01-29 MED ORDER — KETOROLAC TROMETHAMINE 30 MG/ML IJ SOLN
INTRAMUSCULAR | Status: AC
  Filled 2023-01-29: qty 1

## 2023-01-29 MED ORDER — ONDANSETRON 4 MG PO TBDP
4.0000 mg | ORAL_TABLET | Freq: Once | ORAL | Status: AC
  Administered 2023-01-29: 4 mg via ORAL

## 2023-01-29 MED ORDER — ONDANSETRON 4 MG PO TBDP
ORAL_TABLET | ORAL | Status: AC
  Filled 2023-01-29: qty 1

## 2023-01-29 MED ORDER — KETOROLAC TROMETHAMINE 30 MG/ML IJ SOLN
30.0000 mg | Freq: Once | INTRAMUSCULAR | Status: AC
  Administered 2023-01-29: 30 mg via INTRAMUSCULAR

## 2023-01-29 NOTE — ED Triage Notes (Signed)
RN notified by [facility] that pt began experiencing CP and SOB.  Pt brought to triage room.states approx 10 mins ago, had acute onset of midsternal to L-sided CP with SOB. Describes as intermittent,dull, and stabbing. Non-radiating. Denies n/v.  Pt initially presented for HA x3 days.HA is primarily over L eye, radiates to L temple. Has been constant x3 days.

## 2023-01-29 NOTE — ED Triage Notes (Signed)
Pt c/o headache x3 days. Taking OTC meds with relief. States sleeping is the only thing that helps. Denies CP or SOB at this time states it started when she got here but think it was gas.

## 2023-01-29 NOTE — ED Notes (Signed)
EKG reviewed by K. Tiburcio Pea, NP. Pt back to waiting room per NP instruction.

## 2023-01-29 NOTE — ED Provider Notes (Signed)
[facility]    CSN: [id number] Arrival date & time: 01/29/23  1728      History   Chief Complaint Chief Complaint  Patient presents with   Headache    HPI Amy Downs is a 27 y.o. female. She gets headaches that are severe about once a month. This headache started 3 days ago and isn't as severe as usual but isn't going away. She has tried otc migraine medicine and ibuprofen for no relief. Last medicine was ibuprofen and she took it this morning. Reports nausea associated with headache, no vomiting. Denies photophobia or phonophobia. Headache is L forehead and temple area; typical headaches are usually in this area and also the L eye; L eye not hurting today. Denies cahnge in vision. Has appt with pcp in January, couldn't get in sooner.   While she was in waiting room here at urgent care, she developed L chest pain. It lasted about 15 minutes and then went away on its own. Denies SOB or sweating with the chest pain. It occurred at rest. Has never had chest pain before, no hx heart disease; has only been resting lately because of her headache, has not physically exerted herself lately   Headache   Past Medical History:  Diagnosis Date   Abscess    Anxiety    no meds   HSV-2 infection    Medical history non-contributory     Patient Active Problem List   Diagnosis Date Noted   Decreased fetal movement     Past Surgical History:  Procedure Laterality Date   CESAREAN SECTION     twin IUP with PreE   WISDOM TOOTH EXTRACTION      OB History     Gravida  2   Para  2   Term  1   Preterm  1   AB      Living  3      SAB      IAB      Ectopic      Multiple  1   Live Births  3            Home Medications    Prior to Admission medications   Not on File    Family History Family History  Problem Relation Age of Onset   Heart disease Father     Social History Social History   Tobacco Use   Smoking status: Every Day    Current  packs/day: 0.25    Types: Cigarettes   Smokeless tobacco: Never  Vaping Use   Vaping status: Never Used  Substance Use Topics   Alcohol use: No   Drug use: No     Allergies   Patient has no known allergies.   Review of Systems Review of Systems  Neurological:  Positive for headaches.     Physical Exam Triage Vital Signs ED Triage Vitals  Encounter Vitals Group     BP 01/29/23 1805 125/81     Systolic BP Percentile --      Diastolic BP Percentile --      Pulse Rate 01/29/23 1805 70     Resp 01/29/23 1805 20     Temp 01/29/23 1854 98.2 F (36.8 C)     Temp src --      SpO2 01/29/23 1805 100 %     Weight --      Height --      Head Circumference --      Peak Flow --  Pain Score 01/29/23 1855 7     Pain Loc --      Pain Education --      Exclude from Growth Chart --    No data found.  Updated Vital Signs BP 123/79 (BP Location: Right Arm)   Pulse 98   Temp 98.2 F (36.8 C)   Resp 18   LMP 01/07/2023 (Approximate)   SpO2 97%   Breastfeeding No   Visual Acuity Right Eye Distance:   Left Eye Distance:   Bilateral Distance:    Right Eye Near:   Left Eye Near:    Bilateral Near:     Physical Exam Constitutional:      General: She is not in acute distress.    Appearance: She is well-developed. She is obese. She is not diaphoretic.  HENT:     Head: Normocephalic and atraumatic.  Eyes:     Pupils: Pupils are equal, round, and reactive to light.  Cardiovascular:     Rate and Rhythm: Normal rate and regular rhythm.  Pulmonary:     Effort: Pulmonary effort is normal.     Breath sounds: Normal breath sounds.  Chest:     Chest wall: Tenderness present.    Neurological:     Mental Status: She is alert.      UC Treatments / Results  Labs (all labs ordered are listed, but only abnormal results are displayed) Labs Reviewed - No data to display  EKG  EKG: normal EKG, normal sinus rhythm.   Radiology No results  found.  Procedures Procedures (including critical care time)  Medications Ordered in UC Medications  ketorolac (TORADOL) 30 MG/ML injection 30 mg (has no administration in time range)  ondansetron (ZOFRAN-ODT) disintegrating tablet 4 mg (has no administration in time range)    Initial Impression / Assessment and Plan / UC Course  I have reviewed the triage vital signs and the nursing notes.  Pertinent labs & imaging results that were available during my care of the patient were reviewed by me and considered in my medical decision making (see chart for details).    Will tx for migraine with toradol and zofran. Pt has had migraine medicine in the past but not for years; she will f/u with pcp as scheduled to discuss headache management.   Chest pain likely chest wall pain and it now resolved unless I palpate the area. Dsicussed reasons for seeking emergency care for chest pain  Final Clinical Impressions(s) / UC Diagnoses   Final diagnoses:  Acute nonintractable headache, unspecified headache type  Chest wall pain     Discharge Instructions      Talk with your primary care provider about your headaches at your next appointment in January.   Try to go home and sleep tonight   ED Prescriptions   None    PDMP not reviewed this encounter.   Cathlyn Parsons, NP 01/29/23 1926

## 2023-01-29 NOTE — Discharge Instructions (Signed)
Talk with your primary care provider about your headaches at your next appointment in January.   Try to go home and sleep tonight

## 2023-02-17 ENCOUNTER — Emergency Department (HOSPITAL_COMMUNITY)
Admission: EM | Admit: 2023-02-17 | Discharge: 2023-02-17 | Disposition: A | Discharge status: Home / Self Care | Payer: Medicaid Other | Attending: Emergency Medicine | Admitting: Emergency Medicine

## 2023-02-17 ENCOUNTER — Encounter (HOSPITAL_COMMUNITY): Payer: Self-pay

## 2023-02-17 DIAGNOSIS — R112 Nausea with vomiting, unspecified: Secondary | ICD-10-CM | POA: Diagnosis present

## 2023-02-17 DIAGNOSIS — K529 Noninfective gastroenteritis and colitis, unspecified: Secondary | ICD-10-CM | POA: Insufficient documentation

## 2023-02-17 LAB — CBC
HCT: 40.5 % (ref 36.0–46.0)
Hemoglobin: 13.5 g/dL (ref 12.0–15.0)
MCH: 28.8 pg (ref 26.0–34.0)
MCHC: 33.3 g/dL (ref 30.0–36.0)
MCV: 86.5 fL (ref 80.0–100.0)
Platelets: 306 10*3/uL (ref 150–400)
RBC: 4.68 MIL/uL (ref 3.87–5.11)
RDW: 13.6 % (ref 11.5–15.5)
WBC: 7.2 10*3/uL (ref 4.0–10.5)
nRBC: 0 % (ref 0.0–0.2)

## 2023-02-17 LAB — URINALYSIS, ROUTINE W REFLEX MICROSCOPIC
Bilirubin Urine: NEGATIVE
Glucose, UA: NEGATIVE mg/dL
Hgb urine dipstick: NEGATIVE
Ketones, ur: 20 mg/dL — AB
Leukocytes,Ua: NEGATIVE
Nitrite: NEGATIVE
Protein, ur: 100 mg/dL — AB
Specific Gravity, Urine: 1.024 (ref 1.005–1.030)
pH: 8 (ref 5.0–8.0)

## 2023-02-17 LAB — COMPREHENSIVE METABOLIC PANEL
ALT: 15 U/L (ref 0–44)
AST: 23 U/L (ref 15–41)
Albumin: 4 g/dL (ref 3.5–5.0)
Alkaline Phosphatase: 41 U/L (ref 38–126)
Anion gap: 11 (ref 5–15)
BUN: 15 mg/dL (ref 6–20)
CO2: 20 mmol/L — ABNORMAL LOW (ref 22–32)
Calcium: 8.9 mg/dL (ref 8.9–10.3)
Chloride: 105 mmol/L (ref 98–111)
Creatinine, Ser: 0.99 mg/dL (ref 0.44–1.00)
GFR, Estimated: >60 mL/min (ref >60)
Glucose, Bld: 153 mg/dL — ABNORMAL HIGH (ref 70–99)
Potassium: 3.5 mmol/L (ref 3.5–5.1)
Sodium: 136 mmol/L (ref 135–145)
Total Bilirubin: 0.9 mg/dL (ref <1.2)
Total Protein: 8.4 g/dL — ABNORMAL HIGH (ref 6.5–8.1)

## 2023-02-17 LAB — HCG, SERUM, QUALITATIVE: Preg, Serum: NEGATIVE

## 2023-02-17 LAB — LIPASE, BLOOD: Lipase: 23 U/L (ref 11–51)

## 2023-02-17 MED ORDER — ONDANSETRON 4 MG PO TBDP
4.0000 mg | ORAL_TABLET | Freq: Once | ORAL | Status: AC | PRN
  Administered 2023-02-17: 4 mg via ORAL
  Filled 2023-02-17: qty 1

## 2023-02-17 MED ORDER — DICYCLOMINE HCL 20 MG PO TABS: 20.0000 mg | ORAL_TABLET | Freq: Two times a day (BID) | ORAL | 0 refills | Status: DC

## 2023-02-17 MED ORDER — ONDANSETRON 4 MG PO TBDP: 4.0000 mg | ORAL_TABLET | Freq: Three times a day (TID) | ORAL | 0 refills | Status: DC | PRN

## 2023-02-17 MED ORDER — SODIUM CHLORIDE 0.9 % IV BOLUS
1000.0000 mL | Freq: Once | INTRAVENOUS | Status: AC
  Administered 2023-02-17: 1000 mL via INTRAVENOUS

## 2023-02-17 MED ORDER — ONDANSETRON HCL 4 MG/2ML IJ SOLN
4.0000 mg | Freq: Once | INTRAMUSCULAR | Status: AC
  Administered 2023-02-17: 4 mg via INTRAVENOUS
  Filled 2023-02-17: qty 2

## 2023-02-17 NOTE — ED Notes (Signed)
Patient stating she needs to lay down, patient laid herself on triage floor.

## 2023-02-17 NOTE — ED Triage Notes (Signed)
Patient arrived stating she has been vomiting all day and unable to keep anything down. Reporting generalized abdominal pain.

## 2023-02-17 NOTE — Discharge Instructions (Addendum)
You are seen in the emergency department today with concerns of abdominal pain.  Your labs were reassuring with no severe dehydration noted.  I suspect your symptoms are likely due to gastroenteritis.  I sent prescriptions for Zofran and Bentyl to your pharmacy to help with your symptoms. Try to focus on your hydration as much as you can. If symptoms worsen, please return to the ER. Otherwise, follow up with your primary care provider.

## 2023-02-17 NOTE — ED Provider Notes (Signed)
 [place] EMERGENCY DEPARTMENT AT [facility] Provider Note   CSN: [id number] Arrival date & time: 02/17/23  0044     History Chief Complaint  Patient presents with   Abdominal Pain    Amy Downs is a 27 y.o. female. Patient with concerns of nausea and vomiting. Reports that symptoms began yesterday and has had multiple episodes of nausea and vomiting with a cramping feeling when she needs to vomit. She denies any fever, chills, body aches. No sick contacts as far as patient is aware. Denies hemoptysis, hematochezia, or hematemesis. Denies any chance of pregnancy. When nausea is minimal, states that there is no abdominal pain present.   Abdominal Pain      Home Medications Prior to Admission medications   Medication Sig Start Date End Date Taking? Authorizing Provider  dicyclomine (BENTYL) 20 MG tablet Take 1 tablet (20 mg total) by mouth 2 (two) times daily. 02/17/23  Yes Maryanna Shape A, PA-C  ondansetron (ZOFRAN-ODT) 4 MG disintegrating tablet Take 1 tablet (4 mg total) by mouth every 8 (eight) hours as needed for nausea or vomiting. 02/17/23  Yes Smitty Knudsen, PA-C      Allergies    Patient has no known allergies.    Review of Systems   Review of Systems  Gastrointestinal:  Positive for abdominal pain.  All other systems reviewed and are negative.   Physical Exam Updated Vital Signs BP 111/72   Pulse 79   Temp 97.7 F (36.5 C)   Resp 18   LMP 01/07/2023 (Approximate)   SpO2 98%  Physical Exam Vitals and nursing note reviewed.  Constitutional:      General: She is not in acute distress.    Appearance: She is well-developed.  HENT:     Head: Normocephalic and atraumatic.  Eyes:     Conjunctiva/sclera: Conjunctivae normal.  Cardiovascular:     Rate and Rhythm: Normal rate and regular rhythm.     Heart sounds: No murmur heard. Pulmonary:     Effort: Pulmonary effort is normal. No respiratory distress.     Breath sounds: Normal breath  sounds.  Abdominal:     Palpations: Abdomen is soft.     Tenderness: There is abdominal tenderness in the periumbilical area. There is no right CVA tenderness, left CVA tenderness, guarding or rebound. Negative signs include Murphy's sign, Rovsing's sign and McBurney's sign.  Musculoskeletal:        General: No swelling.     Cervical back: Neck supple.  Skin:    General: Skin is warm and dry.     Capillary Refill: Capillary refill takes less than 2 seconds.  Neurological:     Mental Status: She is alert.  Psychiatric:        Mood and Affect: Mood normal.     ED Results / Procedures / Treatments   Labs (all labs ordered are listed, but only abnormal results are displayed) Labs Reviewed  COMPREHENSIVE METABOLIC PANEL - Abnormal; Notable for the following components:      Result Value   CO2 20 (*)    Glucose, Bld 153 (*)    Total Protein 8.4 (*)    All other components within normal limits  URINALYSIS, ROUTINE W REFLEX MICROSCOPIC - Abnormal; Notable for the following components:   APPearance HAZY (*)    Ketones, ur 20 (*)    Protein, ur 100 (*)    Bacteria, UA RARE (*)    All other components within normal limits  LIPASE,  BLOOD  CBC  HCG, SERUM, QUALITATIVE    EKG None  Radiology No results found.  Procedures Procedures   Medications Ordered in ED Medications  ondansetron (ZOFRAN-ODT) disintegrating tablet 4 mg (4 mg Oral Given 02/17/23 0059)  ondansetron (ZOFRAN) injection 4 mg (4 mg Intravenous Given 02/17/23 0734)  sodium chloride 0.9 % bolus 1,000 mL (1,000 mLs Intravenous New Bag/Given 02/17/23 0734)    ED Course/ Medical Decision Making/ A&P                               Medical Decision Making Amount and/or Complexity of Data Reviewed Labs: ordered.  Risk Prescription drug management.   This patient presents to the ED for concern of nausea and vomiting.  Differential diagnosis includes gastroenteritis, bowel obstruction, appendicitis,  urolithiasis   Lab Tests:  I Ordered, and personally interpreted labs.  The pertinent results include: CBC unremarkable, CMP largely unremarkable, urinalysis negative for infection but some ketonuria seen and proteinuria, hCG negative, patient  Medicines ordered and prescription drug management:  I ordered medication including Zofran, fluids for nausea, dehydration Reevaluation of the patient after these medicines showed that the patient improved I have reviewed the patients home medicines and have made adjustments as needed   Problem List / ED Course:  Patient presents emergency department concerns of nausea and vomiting.  He reports this been ongoing for the last day or so without notable improvement.  Despite multiple episodes of vomiting.  Reports having hard time tolerating oral intake.  Denies hematemesis, hemoptysis, hematochezia, melanotic stools.  Has not taken anything to get any symptomatically. On physical exam, patient reports some periumbilical tenderness but no tenderness palpation of the abdomen.  Bowel sounds normal.  She describes the discomfort as a cramping feeling when she feels the urge to vomit.  Outside of the surgery nausea, patient denies any significant Donnell pain.  No fever chills or bodyaches.  Clinically slightly dehydrated with delayed capillary refill. After administration of Zofran and fluids, some symptomatic improvement. Still endorsing occasional abdominal cramping but no focal area of pain and no persistence in vomiting. I suspect this is likely gastroenteritis given symptoms and do not feel that imaging would be beneficial at this time given lack of focal abdominal pain and symptoms coinciding with cramping occurring when patient feels the urge to vomit. Doubt bowel obstruction as patient still having bowel movements and bowel sounds normal, doubt appendicitis given no RLQ, LLQ, and negative psoas, doubt urolithiasis given no flank pain and no hematuria. Will  instead treat symptomatically at home with prescriptions for Zofran and Bentyl sent to pharmacy. Advised strict return precautions. Patient otherwise stable and discharge home with instructions for close PCP follow up.  Final Clinical Impression(s) / ED Diagnoses Final diagnoses:  Gastroenteritis    Rx / DC Orders ED Discharge Orders          Ordered    ondansetron (ZOFRAN-ODT) 4 MG disintegrating tablet  Every 8 hours PRN        02/17/23 0934    dicyclomine (BENTYL) 20 MG tablet  2 times daily        02/17/23 0934              Smitty Knudsen, PA-C 02/17/23 0272    Alvira Monday, MD 02/18/23 [phone]

## 2023-03-04 ENCOUNTER — Other Ambulatory Visit: Payer: Self-pay | Admitting: Physician Assistant

## 2023-03-04 ENCOUNTER — Encounter: Payer: Self-pay | Admitting: Physician Assistant

## 2023-03-04 DIAGNOSIS — K5900 Constipation, unspecified: Secondary | ICD-10-CM

## 2023-03-04 DIAGNOSIS — R1032 Left lower quadrant pain: Secondary | ICD-10-CM

## 2023-03-07 ENCOUNTER — Ambulatory Visit
Admission: RE | Admit: 2023-03-07 | Discharge: 2023-03-07 | Disposition: A | Discharge status: Home / Self Care | Payer: Medicaid Other | Source: Ambulatory Visit | Attending: Physician Assistant | Admitting: Physician Assistant

## 2023-03-07 DIAGNOSIS — K5900 Constipation, unspecified: Secondary | ICD-10-CM

## 2023-03-07 DIAGNOSIS — R1032 Left lower quadrant pain: Secondary | ICD-10-CM

## 2023-05-13 ENCOUNTER — Ambulatory Visit (HOSPITAL_COMMUNITY)
Admission: RE | Admit: 2023-05-13 | Discharge: 2023-05-13 | Disposition: A | Discharge status: Home / Self Care | Source: Ambulatory Visit | Attending: Family Medicine | Admitting: Family Medicine

## 2023-05-13 ENCOUNTER — Encounter (HOSPITAL_COMMUNITY): Payer: Self-pay

## 2023-05-13 VITALS — BP 115/75 | HR 76 | Temp 98.3°F | Resp 18

## 2023-05-13 DIAGNOSIS — N939 Abnormal uterine and vaginal bleeding, unspecified: Secondary | ICD-10-CM

## 2023-05-13 LAB — CBC
HCT: 38.4 % (ref 36.0–46.0)
Hemoglobin: 12.1 g/dL (ref 12.0–15.0)
MCH: 28.5 pg (ref 26.0–34.0)
MCHC: 31.5 g/dL (ref 30.0–36.0)
MCV: 90.6 fL (ref 80.0–100.0)
Platelets: 321 10*3/uL (ref 150–400)
RBC: 4.24 MIL/uL (ref 3.87–5.11)
RDW: 14 % (ref 11.5–15.5)
WBC: 4.4 10*3/uL (ref 4.0–10.5)
nRBC: 0 % (ref 0.0–0.2)

## 2023-05-13 LAB — POCT URINE PREGNANCY: Preg Test, Ur: NEGATIVE

## 2023-05-13 MED ORDER — MEDROXYPROGESTERONE ACETATE 10 MG PO TABS: 10.0000 mg | ORAL_TABLET | Freq: Every day | ORAL | 0 refills | Status: DC

## 2023-05-13 NOTE — ED Provider Notes (Signed)
 [facility]    CSN: [id number] Arrival date & time: 05/13/23  1029      History   Chief Complaint Chief Complaint  Patient presents with   appt 1030    HPI Amy Downs is a 28 y.o. female.   HPI Here for vaginal bleeding that is been going on for 13 days.  She has not had any cramping.  She did have 1 day where it was heavier and she had some clots come out, but mostly it has been not as heavy as her.  Usually is.  No fever and no dysuria.  Prior to this episode of bleeding her last menstrual cycle was January 9.  She did not have a menstrual cycle in February.  She did not do a UPT when she missed her period in February.  NKDA  No dizziness Past Medical History:  Diagnosis Date   Abscess    Anxiety    no meds   HSV-2 infection    Medical history non-contributory     Patient Active Problem List   Diagnosis Date Noted   Decreased fetal movement     Past Surgical History:  Procedure Laterality Date   CESAREAN SECTION     twin IUP with PreE   HERNIA REPAIR     WISDOM TOOTH EXTRACTION      OB History     Gravida  2   Para  2   Term  1   Preterm  1   AB      Living  3      SAB      IAB      Ectopic      Multiple  1   Live Births  3            Home Medications    Prior to Admission medications   Medication Sig Start Date End Date Taking? Authorizing Provider  medroxyPROGESTERone (PROVERA) 10 MG tablet Take 1 tablet (10 mg total) by mouth daily for 10 days. 05/13/23 05/23/23 Yes Zenia Resides, MD    Family History Family History  Problem Relation Age of Onset   Heart disease Father     Social History Social History   Tobacco Use   Smoking status: Every Day    Current packs/day: 0.25    Types: Cigarettes   Smokeless tobacco: Never  Vaping Use   Vaping status: Never Used  Substance Use Topics   Alcohol use: No   Drug use: No     Allergies   Patient has no known allergies.   Review of Systems Review  of Systems   Physical Exam Triage Vital Signs ED Triage Vitals  Encounter Vitals Group     BP 05/13/23 1040 115/75     Systolic BP Percentile --      Diastolic BP Percentile --      Pulse Rate 05/13/23 1040 76     Resp 05/13/23 1040 18     Temp 05/13/23 1040 98.3 F (36.8 C)     Temp Source 05/13/23 1040 Oral     SpO2 05/13/23 1040 98 %     Weight --      Height --      Head Circumference --      Peak Flow --      Pain Score 05/13/23 1039 0     Pain Loc --      Pain Education --      Exclude from Growth Chart --  No data found.  Updated Vital Signs BP 115/75 (BP Location: Right Arm)   Pulse 76   Temp 98.3 F (36.8 C) (Oral)   Resp 18   LMP 04/30/2023 (Exact Date)   SpO2 98%   Visual Acuity Right Eye Distance:   Left Eye Distance:   Bilateral Distance:    Right Eye Near:   Left Eye Near:    Bilateral Near:     Physical Exam Vitals reviewed.  Constitutional:      General: She is not in acute distress.    Appearance: She is not ill-appearing, toxic-appearing or diaphoretic.  HENT:     Mouth/Throat:     Mouth: Mucous membranes are moist.  Eyes:     Extraocular Movements: Extraocular movements intact.     Conjunctiva/sclera: Conjunctivae normal.     Pupils: Pupils are equal, round, and reactive to light.  Cardiovascular:     Rate and Rhythm: Normal rate and regular rhythm.     Heart sounds: No murmur heard. Pulmonary:     Effort: Pulmonary effort is normal.     Breath sounds: Normal breath sounds.  Abdominal:     General: There is no distension.     Palpations: Abdomen is soft. There is no mass.     Tenderness: There is no abdominal tenderness. There is no guarding.  Musculoskeletal:     Cervical back: Neck supple.  Lymphadenopathy:     Cervical: No cervical adenopathy.  Skin:    Coloration: Skin is not jaundiced or pale.  Neurological:     General: No focal deficit present.     Mental Status: She is alert and oriented to person, place, and  time.  Psychiatric:        Behavior: Behavior normal.      UC Treatments / Results  Labs (all labs ordered are listed, but only abnormal results are displayed) Labs Reviewed  POCT URINE PREGNANCY - Normal  CBC    EKG   Radiology No results found.  Procedures Procedures (including critical care time)  Medications Ordered in UC Medications - No data to display  Initial Impression / Assessment and Plan / UC Course  I have reviewed the triage vital signs and the nursing notes.  Pertinent labs & imaging results that were available during my care of the patient were reviewed by me and considered in my medical decision making (see chart for details).     Vital signs and exam are reassuring.  CBC is drawn and we will let her know if she is anemic.  UPT is negative  Progesterone is sent into the pharmacy, and I discussed with her the intended of this medication.  She will follow-up with her primary care Final Clinical Impressions(s) / UC Diagnoses   Final diagnoses:  Abnormal uterine bleeding     Discharge Instructions      The pregnancy test was negative  Take medroxyprogesterone 10 mg--1 tablet daily for 7 days.  Hopefully this medication will help you stop bleeding; then you should expect a withdrawal bleed 3 to 4 days after you finish this medication.  We have drawn blood to check your blood counts, and staff will notify you if anything is significantly abnormal.  Please follow-up with your primary care about this issue     ED Prescriptions     Medication Sig Dispense Auth. Provider   medroxyPROGESTERone (PROVERA) 10 MG tablet Take 1 tablet (10 mg total) by mouth daily for 10 days. 10 tablet [address]  K, MD      PDMP not reviewed this encounter.   Zenia Resides, MD 05/13/23 [phone]

## 2023-05-13 NOTE — ED Triage Notes (Addendum)
 Pt reports her menstrual cycle been ongoing for 13 days. Her normal cycle is 2-3 days. Pt reports that heavy bleeding with blood clots. Pt has picture of the biggest blood clot. Denies pain or cramping.  Had inguinal Hernia surgery was 2/28, unsure if related.

## 2023-05-13 NOTE — Discharge Instructions (Addendum)
 The pregnancy test was negative  Take medroxyprogesterone 10 mg--1 tablet daily for 7 days.  Hopefully this medication will help you stop bleeding; then you should expect a withdrawal bleed 3 to 4 days after you finish this medication.  We have drawn blood to check your blood counts, and staff will notify you if anything is significantly abnormal.  Please follow-up with your primary care about this issue

## 2023-05-17 ENCOUNTER — Telehealth: Admitting: Physician Assistant

## 2023-05-17 DIAGNOSIS — N939 Abnormal uterine and vaginal bleeding, unspecified: Secondary | ICD-10-CM | POA: Diagnosis not present

## 2023-05-17 NOTE — Progress Notes (Signed)
 Virtual Visit Consent   Amy Downs, you are scheduled for a virtual visit with a [facility] provider today. Just as with appointments in the office, your consent must be obtained to participate. Your consent will be active for this visit and any virtual visit you may have with one of our providers in the next 365 days. If you have a MyChart account, a copy of this consent can be sent to you electronically.  As this is a virtual visit, video technology does not allow for your provider to perform a traditional examination. This may limit your provider's ability to fully assess your condition. If your provider identifies any concerns that need to be evaluated in person or the need to arrange testing (such as labs, EKG, etc.), we will make arrangements to do so. Although advances in technology are sophisticated, we cannot ensure that it will always work on either your end or our end. If the connection with a video visit is poor, the visit may have to be switched to a telephone visit. With either a video or telephone visit, we are not always able to ensure that we have a secure connection.  By engaging in this virtual visit, you consent to the provision of healthcare and authorize for your insurance to be billed (if applicable) for the services provided during this visit. Depending on your insurance coverage, you may receive a charge related to this service.  I need to obtain your verbal consent now. Are you willing to proceed with your visit today? Amy Downs has provided verbal consent on 05/17/2023 for a virtual visit (video or telephone). Amy Jaffe, PA-C  Date: 05/17/2023 6:26 PM   Virtual Visit via Video Note   I, Amy Downs, connected with  Amy Downs  ([id number], [birth year]) on 05/17/23 at  6:15 PM EDT by a video-enabled telemedicine application and verified that I am speaking with the correct person using two identifiers.  Location: Patient: Virtual Visit Location Patient:  Home Provider: Virtual Visit Location Provider: Home Office   I discussed the limitations of evaluation and management by telemedicine and the availability of in person appointments. The patient expressed understanding and agreed to proceed.    History of Present Illness: Amy Downs is a 28 y.o. who identifies as a female who was assigned female at birth,  with a history of heavy vaginal bleeding since March 12, presents after a recent urgent care visit (3 days ago) where she was prescribed Provera. She took the medication for two days, missed a dose, and has since experienced heavy bleeding. She is unsure whether to continue the medication, expressing concern about the heavy bleeding and large blood clots. She has not sought gynecological care for this issue.  Problems:  Patient Active Problem List   Diagnosis Date Noted   Decreased fetal movement     Allergies: No Known Allergies Medications:  Current Outpatient Medications:    medroxyPROGESTERone (PROVERA) 10 MG tablet, Take 1 tablet (10 mg total) by mouth daily for 10 days., Disp: 10 tablet, Rfl: 0  Observations/Objective: Patient is well-developed, well-nourished in no acute distress.  Resting comfortably  at home.  Head is normocephalic, atraumatic.  No labored breathing.  Speech is clear and coherent with logical content.  Patient is alert and oriented at baseline.    Assessment and Plan: 1. Abnormal vaginal bleeding (Primary)   Experiencing menorrhagia with large clots since March 12, following a missed dose of Provera. Continued Provera is expected to regulate bleeding.  -  Continue Provera as prescribed by Urgent Care - Monitor bleeding episodes and symptoms  Follow Up Instructions: I discussed the assessment and treatment plan with the patient. The patient was provided an opportunity to ask questions and all were answered. The patient agreed with the plan and demonstrated an understanding of the instructions.  A copy  of instructions were sent to the patient via MyChart unless otherwise noted below.   Patient has requested to receive PHI (AVS, Work Notes, etc) pertaining to this video visit through e-mail as they are currently without active MyChart. They have voiced understand that email is not considered secure and their health information could be viewed by someone other than the patient.   The patient was advised to call back or seek an in-person evaluation if the symptoms worsen or if the condition fails to improve as anticipated.    Amy Knudsen Mayers, PA-C

## 2023-05-17 NOTE — Patient Instructions (Signed)
 [employer], thank you for joining [employer], PA-C for today's virtual visit.  While this provider is not your primary care provider (PCP), if your PCP is located in our provider database this encounter information will be shared with them immediately following your visit.   A [facility] MyChart account gives you access to today's visit and all your visits, tests, and labs performed at [facility] " click here if you don't have a [facility] MyChart account or go to [email or web address]  Consent: (Patient) Amy Downs provided verbal consent for this virtual visit at the beginning of the encounter.  Current Medications:  Current Outpatient Medications:    medroxyPROGESTERone (PROVERA) 10 MG tablet, Take 1 tablet (10 mg total) by mouth daily for 10 days., Disp: 10 tablet, Rfl: 0   Medications ordered in this encounter:  No orders of the defined types were placed in this encounter.    *If you need refills on other medications prior to your next appointment, please contact your pharmacy*  Follow-Up: Call back or seek an in-person evaluation if the symptoms worsen or if the condition fails to improve as anticipated.  [facility] [phone]  Other Instructions Heavy or Long-Lasting Menstrual Periods: What to Know Heavy or long-lasting periods are also called abnormal uterine bleeding. This is when your monthly periods are heavy or last longer than normal.  If you have this condition, bleeding and cramping may get so bad that they make it hard for you to do your daily activities. What are the causes? Common causes of this condition include: Growths in the uterus (polyps or fibroids). These growths are not cancer. Problems with the tissue that lines the uterus. This tissue may: Grow into the walls of the uterus (adenomyosis). Grow outside the uterus (endometriosis). One of the ovaries not releasing an egg during one or more months. A disorder that  stops the blood from clotting normally. Some medicines. Infection. In some cases, the cause of this condition is not known. What increases the risk? You are more likely to get this condition if you have cancer of the uterus. What are the signs or symptoms? Symptoms of this condition include: Heavy bleeding. You may bleed so much that you have: To change your pad or tampon every 1-2 hours. To use pads and tampons at the same time. To wake up to change your pads or tampons during the night. Blood clots that are larger than 1 inch (2.5 cm) in size. Bleeding that lasts for more than 7 days. How is this diagnosed? This condition may be diagnosed based on a physical exam and your symptoms. Your health care provider will ask you about your period. You may also have tests, including: Blood tests. Pap test. Biopsy. A small piece of the tissue that lines the uterus is tested. Ultrasound of the uterus, ovaries, and vagina. Looking inside the uterus using a flexible tube with a light (hysteroscope). How is this treated? You may not need treatment for this condition. But if you need treatment, you may be given: Birth control pills or an IUD. Hormone therapy. Medicine to reduce swelling, such as ibuprofen. Medicine to make growths in the uterus smaller. Medicines to make your blood clot. Iron pills. Antibiotics. These are given if your bleeding is caused by an infection. If medicines do not work, surgery may be done. Surgery may be done to: Remove a part of the lining of the uterus. Remove growths in the uterus. These may be polyps  or fibroids. Remove the entire lining of the uterus. Remove the uterus entirely. Talk with your provider about your options for treatment. Some treatments may make it hard for you to get pregnant. Tell your provider if you'd like to get pregnant after treatment. Follow these instructions at home: Medicines Take your medicines only as told. Do not change or switch  medicines without asking your provider. Do not take aspirin or medicines that have aspirin 1 week before your period, or during your period. Aspirin may make bleeding worse. Managing trouble pooping Your iron pills may cause trouble pooping (constipation). To help prevent or treat this, you may need to: Take medicines to help you poop. Eat foods high in fiber, like beans, whole grains, and fresh fruits and vegetables. Drink more fluids as told. General instructions If you need to change your pad or tampon more than once every 2 hours, limit your activity until the bleeding stops. Eat well-balanced meals, including foods that are high in iron. Foods that have a lot of iron include leafy, green vegetables, meat, liver, eggs, and whole-grain breads and cereals. Do not try to lose weight until the heavy bleeding has stopped and your blood iron level is back to normal. If you need to lose weight, work with your provider to lose weight safely. Keep all follow-up visits. Your provider will make sure your treatment is working. Your provider may adjust your treatment plan if it is not working. Contact a health care provider if: You soak through a pad or tampon every 1 or 2 hours, and this happens every time you have a period. You need to use pads and tampons at the same time because you are bleeding so much. You vomit or feel like you vomiting. You have watery poop (diarrhea). You have problems from the medicines you are taking. You feel very weak or tired. You feel dizzy or you faint. Get help right away if: You soak through more than a pad or tampon in 1 hour. You pass clots bigger than 1 inch (2.5 cm) wide. You feel short of breath. You feel like your heart is beating too fast. These symptoms may be an emergency. Call 911 right away. Do not wait to see if the symptoms will go away. Do not drive yourself to the hospital. This information is not intended to replace advice given to you by your  health care provider. Make sure you discuss any questions you have with your health care provider. Document Revised: 12/17/2022 Document Reviewed: 07/30/2022 Elsevier Patient Education  2024 Elsevier Inc.   If you have been instructed to have an in-person evaluation today at a local Urgent Care facility, please use the link below. It will take you to a list of all of our available [facility], including address, phone number and hours of operation. Please do not delay care.  [facility]  If you or a family member do not have a primary care provider, use the link below to schedule a visit and establish care. When you choose a [facility] primary care physician or advanced practice provider, you gain a long-term partner in health. Find a Primary Care Provider  Learn more about [facility]'s in-office and virtual care options: [facility] - Get Care Now

## 2023-05-20 ENCOUNTER — Encounter (HOSPITAL_COMMUNITY): Payer: Self-pay

## 2023-05-20 ENCOUNTER — Other Ambulatory Visit: Payer: Self-pay

## 2023-05-20 ENCOUNTER — Emergency Department (HOSPITAL_COMMUNITY)
Admission: EM | Admit: 2023-05-20 | Discharge: 2023-05-20 | Disposition: A | Discharge status: Home / Self Care | Attending: Emergency Medicine | Admitting: Emergency Medicine

## 2023-05-20 DIAGNOSIS — R110 Nausea: Secondary | ICD-10-CM | POA: Insufficient documentation

## 2023-05-20 DIAGNOSIS — N939 Abnormal uterine and vaginal bleeding, unspecified: Secondary | ICD-10-CM | POA: Insufficient documentation

## 2023-05-20 DIAGNOSIS — D649 Anemia, unspecified: Secondary | ICD-10-CM | POA: Diagnosis not present

## 2023-05-20 DIAGNOSIS — E871 Hypo-osmolality and hyponatremia: Secondary | ICD-10-CM | POA: Diagnosis not present

## 2023-05-20 DIAGNOSIS — R102 Pelvic and perineal pain: Secondary | ICD-10-CM | POA: Insufficient documentation

## 2023-05-20 LAB — WET PREP, GENITAL
Clue Cells Wet Prep HPF POC: NONE SEEN
Sperm: NONE SEEN
Trich, Wet Prep: NONE SEEN
WBC, Wet Prep HPF POC: <10 (ref <10)
Yeast Wet Prep HPF POC: NONE SEEN

## 2023-05-20 LAB — TYPE AND SCREEN
ABO/RH(D): A POS
Antibody Screen: NEGATIVE

## 2023-05-20 LAB — CBC
HCT: 37.6 % (ref 36.0–46.0)
Hemoglobin: 11.6 g/dL — ABNORMAL LOW (ref 12.0–15.0)
MCH: 28.1 pg (ref 26.0–34.0)
MCHC: 30.9 g/dL (ref 30.0–36.0)
MCV: 91 fL (ref 80.0–100.0)
Platelets: 255 10*3/uL (ref 150–400)
RBC: 4.13 MIL/uL (ref 3.87–5.11)
RDW: 13.8 % (ref 11.5–15.5)
WBC: 5.9 10*3/uL (ref 4.0–10.5)
nRBC: 0 % (ref 0.0–0.2)

## 2023-05-20 LAB — COMPREHENSIVE METABOLIC PANEL WITH GFR
ALT: 17 U/L (ref 0–44)
AST: 17 U/L (ref 15–41)
Albumin: 3.4 g/dL — ABNORMAL LOW (ref 3.5–5.0)
Alkaline Phosphatase: 48 U/L (ref 38–126)
Anion gap: 8 (ref 5–15)
BUN: 18 mg/dL (ref 6–20)
CO2: 25 mmol/L (ref 22–32)
Calcium: 8.5 mg/dL — ABNORMAL LOW (ref 8.9–10.3)
Chloride: 101 mmol/L (ref 98–111)
Creatinine, Ser: 0.93 mg/dL (ref 0.44–1.00)
GFR, Estimated: >60 mL/min (ref >60)
Glucose, Bld: 88 mg/dL (ref 70–99)
Potassium: 4 mmol/L (ref 3.5–5.1)
Sodium: 134 mmol/L — ABNORMAL LOW (ref 135–145)
Total Bilirubin: 0.3 mg/dL (ref 0.0–1.2)
Total Protein: 7.3 g/dL (ref 6.5–8.1)

## 2023-05-20 LAB — PREGNANCY, URINE: Preg Test, Ur: NEGATIVE

## 2023-05-20 MED ORDER — MEGESTROL ACETATE 40 MG PO TABS
40.0000 mg | ORAL_TABLET | Freq: Every day | ORAL | Status: DC
  Administered 2023-05-20: 40 mg via ORAL
  Filled 2023-05-20: qty 1

## 2023-05-20 MED ORDER — MEGESTROL ACETATE 40 MG PO TABS: 40.0000 mg | ORAL_TABLET | Freq: Every day | ORAL | 1 refills | Status: DC

## 2023-05-20 NOTE — Discharge Instructions (Addendum)
 It was a pleasure caring for you today. As discussed, please call the OBGYN clinic in this discharge paperwork first thing in the morning for close follow up.  Seek emergency care if experiencing any new or worsening symptoms.

## 2023-05-20 NOTE — ED Provider Notes (Signed)
 [place] EMERGENCY DEPARTMENT AT [facility] Provider Note   CSN: [id number] Arrival date & time: 05/20/23  1539     History  Chief Complaint  Patient presents with   Vaginal Bleeding    Amy Downs is a 28 y.o. female G2P3 with PMHx anxiety who presents to ED concerned for menstrual bleeding x3 weeks. Patient denies new birth control or hx of prolonged menstrual bleeding. Patient also endorses pelvic cramping that feels similar to normal menstrual cramping. Patient also with intermittent nausea.   Denies fever, chest pain, dyspnea, cough, vomiting, diarrhea, dysuria, hematuria, hematochezia.    Vaginal Bleeding      Home Medications Prior to Admission medications   Medication Sig Start Date End Date Taking? Authorizing Provider  megestrol (MEGACE) 40 MG tablet Take 1 tablet (40 mg total) by mouth daily. If bleeding does not improve by day 3, you should start taking 2 pills daily. 05/20/23   Dorthy Cooler, PA-C      Allergies    Patient has no known allergies.    Review of Systems   Review of Systems  Genitourinary:  Positive for vaginal bleeding.    Physical Exam Updated Vital Signs BP 126/84 (BP Location: Right Arm)   Pulse 72   Temp 97.7 F (36.5 C) (Oral)   Resp 18   LMP 04/30/2023 (Exact Date)   SpO2 100%  Physical Exam Vitals and nursing note reviewed.  Constitutional:      General: She is not in acute distress.    Appearance: She is not ill-appearing or toxic-appearing.  HENT:     Head: Normocephalic and atraumatic.  Eyes:     General: No scleral icterus.       Right eye: No discharge.        Left eye: No discharge.     Conjunctiva/sclera: Conjunctivae normal.  Cardiovascular:     Rate and Rhythm: Normal rate and regular rhythm.     Heart sounds: Normal heart sounds.  Pulmonary:     Effort: Pulmonary effort is normal.     Breath sounds: Normal breath sounds.  Abdominal:     General: Abdomen is flat. Bowel sounds are normal.  There is no distension.     Palpations: Abdomen is soft. There is no mass.     Tenderness: There is no abdominal tenderness.  Genitourinary:    Comments: RN Dylan present for pelvic exam:  No external lesions present. Blood pooled near cervix without pulsatile flow. This was cleared away with [place] swabs. No lesions appreciated in vaginal vault or cervix.  Skin:    General: Skin is warm and dry.  Neurological:     General: No focal deficit present.     Mental Status: She is alert. Mental status is at baseline.  Psychiatric:        Mood and Affect: Mood normal.        Behavior: Behavior normal.     ED Results / Procedures / Treatments   Labs (all labs ordered are listed, but only abnormal results are displayed) Labs Reviewed  CBC - Abnormal; Notable for the following components:      Result Value   Hemoglobin 11.6 (*)    All other components within normal limits  COMPREHENSIVE METABOLIC PANEL WITH GFR - Abnormal; Notable for the following components:   Sodium 134 (*)    Calcium 8.5 (*)    Albumin 3.4 (*)    All other components within normal limits  WET PREP, GENITAL  PREGNANCY, URINE  TYPE AND SCREEN  GC/CHLAMYDIA PROBE AMP ([place]) NOT AT [facility]    EKG None  Radiology No results found.  Procedures Procedures    Medications Ordered in ED Medications  megestrol (MEGACE) tablet 40 mg (40 mg Oral Given 05/20/23 2111)    ED Course/ Medical Decision Making/ A&P                                 Medical Decision Making Amount and/or Complexity of Data Reviewed Labs: ordered.  Risk Prescription drug management.   This patient presents to the ED for concern of vaginal bleeding, this involves an extensive number of treatment options, and is a complaint that carries with it a high risk of complications and morbidity.  The differential diagnosis includes ectopic pregnancy, pregnancy termination, placental abruption, placenta previa, uterine rupture, postpartum  hemorrhage, acute heavy menstrual bleeding, ruptured ovarian cyst, foreign body   Co morbidities that complicate the patient evaluation  none   Additional history obtained:  Dr. Charlynne Pander PCP   Problem List / ED Course / Critical interventions / Medication management  Patient presents to ED concerned for uterine bleeding x3 weeks. Patient was given a 7 day course of Provera which did not relieve symptoms. Patient endorses pelvic cramping which feels like normal menstrual cramping.  Pelvic exam with pooled blood clots w/o hemorrhagic bleeding. Rest of physical exam and pelvic exam reassuring. Patient afebrile with stable vitals. I Ordered, and personally interpreted labs.  UPT negative.  CMP with mild hyponatremia at 134.  CBC with mild anemia with hemoglobin 11.6.  No leukocytosis.  Wet prep negative. GC/chlamydia pending and to result in 1-3 days. I requested consultation with the OBGYN on-call Dr. Jolayne Panther,  and discussed lab and imaging findings as well as pertinent plan - they recommend: 40mg  Megace once daily - may increase to BID if symptoms do not improve in 3 days. Also recommends outpatient follow up with their clinic.  Shared all results with patient. Answered all questions. Patient understands that she needs to call the [facility] first thing in the morning. Patient tolerated first dose of Megace well in ED. Patient verbalized understanding of plan. I have reviewed the patients home medicines and have made adjustments as needed. Patient afebrile with stable vitals. Provided with return precautions. Discharged in good condition.    Social Determinants of Health:  none          Final Clinical Impression(s) / ED Diagnoses Final diagnoses:  Abnormal uterine bleeding (AUB)    Rx / DC Orders ED Discharge Orders          Ordered    megestrol (MEGACE) 40 MG tablet  Daily,   Status:  Discontinued        05/20/23 2125    megestrol (MEGACE) 40 MG tablet  Daily         05/20/23 2125              Dorthy Cooler, [place] 05/20/23 2143    Durwin Glaze, MD 05/21/23 [phone]

## 2023-05-20 NOTE — ED Triage Notes (Signed)
 Pt. Arrives POV for vaginal bleeding since march 12th. States that she goes through several pads a day. Today she has gone through 6 pads. C/o abdominal cramping.

## 2023-05-21 LAB — GC/CHLAMYDIA PROBE AMP (~~LOC~~) NOT AT ARMC
Chlamydia: NEGATIVE
Comment: NEGATIVE
Comment: NORMAL
Neisseria Gonorrhea: NEGATIVE

## 2023-10-30 ENCOUNTER — Encounter (HOSPITAL_COMMUNITY): Payer: Self-pay

## 2023-10-30 ENCOUNTER — Ambulatory Visit (HOSPITAL_COMMUNITY): Payer: Self-pay | Admitting: Emergency Medicine

## 2023-10-30 ENCOUNTER — Ambulatory Visit (HOSPITAL_COMMUNITY): Admission: EM | Admit: 2023-10-30 | Discharge: 2023-10-30 | Disposition: A | Discharge status: Home / Self Care

## 2023-10-30 DIAGNOSIS — R1012 Left upper quadrant pain: Secondary | ICD-10-CM | POA: Insufficient documentation

## 2023-10-30 DIAGNOSIS — Z3201 Encounter for pregnancy test, result positive: Secondary | ICD-10-CM | POA: Insufficient documentation

## 2023-10-30 LAB — COMPREHENSIVE METABOLIC PANEL WITH GFR
ALT: 14 U/L (ref 0–44)
AST: 21 U/L (ref 15–41)
Albumin: 3.5 g/dL (ref 3.5–5.0)
Alkaline Phosphatase: 40 U/L (ref 38–126)
Anion gap: 10 (ref 5–15)
BUN: 7 mg/dL (ref 6–20)
CO2: 21 mmol/L — ABNORMAL LOW (ref 22–32)
Calcium: 9.1 mg/dL (ref 8.9–10.3)
Chloride: 105 mmol/L (ref 98–111)
Creatinine, Ser: 0.83 mg/dL (ref 0.44–1.00)
GFR, Estimated: >60 mL/min (ref >60)
Glucose, Bld: 92 mg/dL (ref 70–99)
Potassium: 4.1 mmol/L (ref 3.5–5.1)
Sodium: 136 mmol/L (ref 135–145)
Total Bilirubin: 0.6 mg/dL (ref 0.0–1.2)
Total Protein: 7.4 g/dL (ref 6.5–8.1)

## 2023-10-30 LAB — POCT URINALYSIS DIP (MANUAL ENTRY)
Glucose, UA: NEGATIVE mg/dL
Leukocytes, UA: NEGATIVE
Nitrite, UA: NEGATIVE
Protein Ur, POC: 30 mg/dL — AB
Spec Grav, UA: 1.025 (ref 1.010–1.025)
Urobilinogen, UA: 1 U/dL
pH, UA: 6 (ref 5.0–8.0)

## 2023-10-30 LAB — POCT URINE PREGNANCY: Preg Test, Ur: POSITIVE — AB

## 2023-10-30 LAB — CBC WITH DIFFERENTIAL/PLATELET
Abs Immature Granulocytes: 0.03 K/uL (ref 0.00–0.07)
Basophils Absolute: 0 K/uL (ref 0.0–0.1)
Basophils Relative: 1 %
Eosinophils Absolute: 0 K/uL (ref 0.0–0.5)
Eosinophils Relative: 1 %
HCT: 38.8 % (ref 36.0–46.0)
Hemoglobin: 12.7 g/dL (ref 12.0–15.0)
Immature Granulocytes: 1 %
Lymphocytes Relative: 33 %
Lymphs Abs: 1.4 K/uL (ref 0.7–4.0)
MCH: 27.5 pg (ref 26.0–34.0)
MCHC: 32.7 g/dL (ref 30.0–36.0)
MCV: 84 fL (ref 80.0–100.0)
Monocytes Absolute: 0.4 K/uL (ref 0.1–1.0)
Monocytes Relative: 9 %
Neutro Abs: 2.5 K/uL (ref 1.7–7.7)
Neutrophils Relative %: 55 %
Platelets: 291 K/uL (ref 150–400)
RBC: 4.62 MIL/uL (ref 3.87–5.11)
RDW: 15.1 % (ref 11.5–15.5)
WBC: 4.4 K/uL (ref 4.0–10.5)
nRBC: 0 % (ref 0.0–0.2)

## 2023-10-30 LAB — HCG, QUANTITATIVE, PREGNANCY: hCG, Beta Chain, Quant, S: 23196 m[IU]/mL — ABNORMAL HIGH (ref <5)

## 2023-10-30 LAB — LIPASE, BLOOD: Lipase: 23 U/L (ref 11–51)

## 2023-10-30 NOTE — Discharge Instructions (Addendum)
 You tested positive for pregnancy today in clinic.  We are doing some blood work for your abdominal pain and to give us  a range for your pregnancy.  Staff will contact if urgent follow-up is needed.    Please do not take any more Zofran .  For nausea you can take over-the-counter Dramamine, ginger extract and meclizine.  You can also try vitamin B6 100 mg tablets, take 1 tablet twice a day, up to 200 mg/day.  Ensure you are eating small frequent meals and keeping something on your stomach at all times, such as crackers.  If you are not already, please start taking a daily prenatal vitamin.  There are numerous brands available over-the-counter.  Please follow-up with an OB/GYN for further care throughout your pregnancy.  Seek immediate care at the nearest emergency department for any severe pain, vaginal bleeding, or new concerning symptoms.

## 2023-10-30 NOTE — ED Triage Notes (Signed)
 Patient here today with c/o LLQ abd pain, nausea, and vomiting X 5 days. Patient had some left over Zofran  that she tried taking with no relief.

## 2023-10-30 NOTE — ED Provider Notes (Signed)
 [facility]    CSN: [id number] Arrival date & time: 10/30/23  0801      History   Chief Complaint Chief Complaint  Patient presents with   Abdominal Pain    HPI Amy Downs is a 28 y.o. female.   Patient presents to clinic for concern of left lower and upper abdominal pain for the past 5 days.  She has also had nausea and vomiting.  Diminished appetite.  Did have some leftover Zofran  that she has been taking at home, tried 1 dose yesterday and ended up vomiting this back up so she did not take anymore.  Did have soft stool this morning.  Denies fevers.  Is sexually active unprotected, has been with her boyfriend and having unprotected intercourse for the past 5 years.  Reports irregular periods but she is 10 days late for her current menstrual cycle.  Has not had any vaginal bleeding.  The history is provided by the patient and medical records.  Abdominal Pain   Past Medical History:  Diagnosis Date   Abscess    Anxiety    no meds   HSV-2 infection    Medical history non-contributory     Patient Active Problem List   Diagnosis Date Noted   Decreased fetal movement     Past Surgical History:  Procedure Laterality Date   CESAREAN SECTION     twin IUP with PreE   HERNIA REPAIR     WISDOM TOOTH EXTRACTION      OB History     Gravida  2   Para  2   Term  1   Preterm  1   AB      Living  3      SAB      IAB      Ectopic      Multiple  1   Live Births  3            Home Medications    Prior to Admission medications   Medication Sig Start Date End Date Taking? Authorizing Provider  cyclobenzaprine  (FLEXERIL ) 5 MG tablet Take 5 mg by mouth 3 (three) times daily as needed for muscle spasms. 09/05/23  Yes [provider]    Family History Family History  Problem Relation Age of Onset   Heart disease Father     Social History Social History   Tobacco Use   Smoking status: Never   Smokeless tobacco: Never   Vaping Use   Vaping status: Never Used  Substance Use Topics   Alcohol use: No   Drug use: Yes    Types: Marijuana     Allergies   Patient has no known allergies.   Review of Systems Review of Systems  Per HPI  Physical Exam Triage Vital Signs ED Triage Vitals  Encounter Vitals Group     BP 10/30/23 0818 106/77     Girls Systolic BP Percentile --      Girls Diastolic BP Percentile --      Boys Systolic BP Percentile --      Boys Diastolic BP Percentile --      Pulse Rate 10/30/23 0818 74     Resp 10/30/23 0818 16     Temp 10/30/23 0818 98.9 F (37.2 C)     Temp Source 10/30/23 0818 Oral     SpO2 10/30/23 0818 97 %     Weight --      Height --  Head Circumference --      Peak Flow --      Pain Score 10/30/23 0821 7     Pain Loc --      Pain Education --      Exclude from Growth Chart --    No data found.  Updated Vital Signs BP 106/77 (BP Location: Left Arm)   Pulse 74   Temp 98.9 F (37.2 C) (Oral)   Resp 16   LMP 09/22/2023 (Exact Date)   SpO2 97%   Visual Acuity Right Eye Distance:   Left Eye Distance:   Bilateral Distance:    Right Eye Near:   Left Eye Near:    Bilateral Near:     Physical Exam Vitals and nursing note reviewed.  Constitutional:      Appearance: She is well-developed.  HENT:     Head: Normocephalic and atraumatic.     Mouth/Throat:     Mouth: Mucous membranes are moist.  Cardiovascular:     Rate and Rhythm: Normal rate.  Pulmonary:     Effort: Pulmonary effort is normal. No respiratory distress.  Abdominal:     General: Abdomen is flat. Bowel sounds are normal.     Palpations: Abdomen is soft.     Tenderness: There is abdominal tenderness in the left upper quadrant and left lower quadrant. There is no guarding or rebound.  Skin:    General: Skin is warm and dry.  Neurological:     General: No focal deficit present.     Mental Status: She is alert and oriented to person, place, and time.  Psychiatric:         Mood and Affect: Mood normal.        Behavior: Behavior normal.      UC Treatments / Results  Labs (all labs ordered are listed, but only abnormal results are displayed) Labs Reviewed  POCT URINALYSIS DIP (MANUAL ENTRY) - Abnormal; Notable for the following components:      Result Value   Clarity, UA cloudy (*)    Bilirubin, UA small (*)    Ketones, POC UA small (15) (*)    Blood, UA trace-intact (*)    Protein Ur, POC =30 (*)    All other components within normal limits  POCT URINE PREGNANCY - Abnormal; Notable for the following components:   Preg Test, Ur Positive (*)    All other components within normal limits  HCG, QUANTITATIVE, PREGNANCY  LIPASE, BLOOD  COMPREHENSIVE METABOLIC PANEL WITH GFR  CBC WITH DIFFERENTIAL/PLATELET    EKG   Radiology No results found.  Procedures Procedures (including critical care time)  Medications Ordered in UC Medications - No data to display  Initial Impression / Assessment and Plan / UC Course  I have reviewed the triage vital signs and the nursing notes.  Pertinent labs & imaging results that were available during my care of the patient were reviewed by me and considered in my medical decision making (see chart for details).  Vitals and triage reviewed, patient is hemodynamically stable.  Abdomen is soft with active bowel sounds, mild left upper and lower quadrant tenderness, without rebound or guarding.  Low concern for acute abdomen at this time.  Medication safe in pregnancy discussed.  Will check basic labs including hCG quant.  Encouraged OB/GYN follow-up.  Strict emergency precautions given if symptoms evolve.  Plan of care, follow-up care return precautions given, no questions at this time.     Final Clinical Impressions(s) / UC  Diagnoses   Final diagnoses:  Positive urine pregnancy test  Left upper quadrant abdominal pain     Discharge Instructions      You tested positive for pregnancy today in clinic.  We are  doing some blood work for your abdominal pain and to give us  a range for your pregnancy.  Staff will contact if urgent follow-up is needed.    Please do not take any more Zofran .  For nausea you can take over-the-counter Dramamine, ginger extract and meclizine.  You can also try vitamin B6 100 mg tablets, take 1 tablet twice a day, up to 200 mg/day.  Ensure you are eating small frequent meals and keeping something on your stomach at all times, such as crackers.  If you are not already, please start taking a daily prenatal vitamin.  There are numerous brands available over-the-counter.  Please follow-up with an OB/GYN for further care throughout your pregnancy.  Seek immediate care at the nearest emergency department for any severe pain, vaginal bleeding, or new concerning symptoms.      ED Prescriptions   None    PDMP not reviewed this encounter.   Dreama Lary SAILOR, FNP 10/30/23 [phone]

## 2023-10-31 ENCOUNTER — Other Ambulatory Visit: Payer: Self-pay

## 2023-10-31 ENCOUNTER — Emergency Department (HOSPITAL_COMMUNITY)

## 2023-10-31 ENCOUNTER — Encounter (HOSPITAL_COMMUNITY): Payer: Self-pay

## 2023-10-31 ENCOUNTER — Emergency Department (HOSPITAL_COMMUNITY)
Admission: EM | Admit: 2023-10-31 | Discharge: 2023-10-31 | Disposition: A | Discharge status: Home / Self Care | Attending: Emergency Medicine | Admitting: Emergency Medicine

## 2023-10-31 DIAGNOSIS — O219 Vomiting of pregnancy, unspecified: Secondary | ICD-10-CM | POA: Diagnosis not present

## 2023-10-31 DIAGNOSIS — O26891 Other specified pregnancy related conditions, first trimester: Secondary | ICD-10-CM | POA: Insufficient documentation

## 2023-10-31 DIAGNOSIS — O99281 Endocrine, nutritional and metabolic diseases complicating pregnancy, first trimester: Secondary | ICD-10-CM | POA: Diagnosis not present

## 2023-10-31 DIAGNOSIS — R1032 Left lower quadrant pain: Secondary | ICD-10-CM | POA: Insufficient documentation

## 2023-10-31 DIAGNOSIS — R112 Nausea with vomiting, unspecified: Secondary | ICD-10-CM

## 2023-10-31 DIAGNOSIS — E876 Hypokalemia: Secondary | ICD-10-CM | POA: Diagnosis not present

## 2023-10-31 DIAGNOSIS — R1030 Lower abdominal pain, unspecified: Secondary | ICD-10-CM

## 2023-10-31 DIAGNOSIS — Z3A01 Less than 8 weeks gestation of pregnancy: Secondary | ICD-10-CM | POA: Insufficient documentation

## 2023-10-31 LAB — URINALYSIS, ROUTINE W REFLEX MICROSCOPIC
Bilirubin Urine: NEGATIVE
Glucose, UA: NEGATIVE mg/dL
Hgb urine dipstick: NEGATIVE
Ketones, ur: 80 mg/dL — AB
Nitrite: NEGATIVE
Protein, ur: 30 mg/dL — AB
Specific Gravity, Urine: 1.028 (ref 1.005–1.030)
pH: 7 (ref 5.0–8.0)

## 2023-10-31 LAB — CBC
HCT: 39.5 % (ref 36.0–46.0)
Hemoglobin: 12.8 g/dL (ref 12.0–15.0)
MCH: 27 pg (ref 26.0–34.0)
MCHC: 32.4 g/dL (ref 30.0–36.0)
MCV: 83.3 fL (ref 80.0–100.0)
Platelets: 313 K/uL (ref 150–400)
RBC: 4.74 MIL/uL (ref 3.87–5.11)
RDW: 14.9 % (ref 11.5–15.5)
WBC: 4.4 K/uL (ref 4.0–10.5)
nRBC: 0 % (ref 0.0–0.2)

## 2023-10-31 LAB — COMPREHENSIVE METABOLIC PANEL WITH GFR
ALT: 11 U/L (ref 0–44)
AST: 18 U/L (ref 15–41)
Albumin: 4.2 g/dL (ref 3.5–5.0)
Alkaline Phosphatase: 51 U/L (ref 38–126)
Anion gap: 16 — ABNORMAL HIGH (ref 5–15)
BUN: 7 mg/dL (ref 6–20)
CO2: 18 mmol/L — ABNORMAL LOW (ref 22–32)
Calcium: 9.7 mg/dL (ref 8.9–10.3)
Chloride: 101 mmol/L (ref 98–111)
Creatinine, Ser: 0.86 mg/dL (ref 0.44–1.00)
GFR, Estimated: >60 mL/min (ref >60)
Glucose, Bld: 115 mg/dL — ABNORMAL HIGH (ref 70–99)
Potassium: 3.3 mmol/L — ABNORMAL LOW (ref 3.5–5.1)
Sodium: 136 mmol/L (ref 135–145)
Total Bilirubin: 0.7 mg/dL (ref 0.0–1.2)
Total Protein: 8.2 g/dL — ABNORMAL HIGH (ref 6.5–8.1)

## 2023-10-31 LAB — WET PREP, GENITAL
Clue Cells Wet Prep HPF POC: NONE SEEN
Sperm: NONE SEEN
Trich, Wet Prep: NONE SEEN
WBC, Wet Prep HPF POC: <10 (ref <10)
Yeast Wet Prep HPF POC: NONE SEEN

## 2023-10-31 LAB — RESP PANEL BY RT-PCR (RSV, FLU A&B, COVID)  RVPGX2
Influenza A by PCR: NEGATIVE
Influenza B by PCR: NEGATIVE
Resp Syncytial Virus by PCR: NEGATIVE
SARS Coronavirus 2 by RT PCR: NEGATIVE

## 2023-10-31 LAB — HCG, QUANTITATIVE, PREGNANCY: hCG, Beta Chain, Quant, S: 26974 m[IU]/mL — ABNORMAL HIGH (ref <5)

## 2023-10-31 LAB — LIPASE, BLOOD: Lipase: 12 U/L (ref 11–51)

## 2023-10-31 MED ORDER — PYRIDOXINE HCL 25 MG PO TABS
25.0000 mg | ORAL_TABLET | Freq: Every day | ORAL | Status: DC
  Administered 2023-10-31: 25 mg via ORAL
  Filled 2023-10-31: qty 1

## 2023-10-31 MED ORDER — ONDANSETRON HCL 4 MG/2ML IJ SOLN
4.0000 mg | Freq: Once | INTRAMUSCULAR | Status: AC
  Administered 2023-10-31: 4 mg via INTRAVENOUS
  Filled 2023-10-31: qty 2

## 2023-10-31 MED ORDER — ONDANSETRON 4 MG PO TBDP
4.0000 mg | ORAL_TABLET | Freq: Three times a day (TID) | ORAL | 0 refills | Status: DC | PRN
Start: 2023-10-31 — End: 2023-11-16

## 2023-10-31 MED ORDER — PROMETHAZINE HCL 25 MG RE SUPP: 25.0000 mg | Freq: Three times a day (TID) | RECTAL | 0 refills | Status: DC | PRN

## 2023-10-31 MED ORDER — SODIUM CHLORIDE 0.9 % IV SOLN
12.5000 mg | Freq: Once | INTRAVENOUS | Status: AC
  Administered 2023-10-31: 12.5 mg via INTRAVENOUS
  Filled 2023-10-31: qty 12.5

## 2023-10-31 MED ORDER — SODIUM CHLORIDE 0.9 % IV BOLUS
1000.0000 mL | Freq: Once | INTRAVENOUS | Status: AC
  Administered 2023-10-31: 1000 mL via INTRAVENOUS

## 2023-10-31 MED ORDER — DOXYLAMINE SUCCINATE (SLEEP) 25 MG PO TABS
25.0000 mg | ORAL_TABLET | Freq: Once | ORAL | Status: AC
  Administered 2023-10-31: 25 mg via ORAL
  Filled 2023-10-31: qty 1

## 2023-10-31 MED ORDER — DOXYLAMINE-PYRIDOXINE 10-10 MG PO TBEC: 1.0000 | DELAYED_RELEASE_TABLET | Freq: Every day | ORAL | 0 refills | Status: DC

## 2023-10-31 MED ORDER — POTASSIUM CHLORIDE ER 10 MEQ PO TBCR: 10.0000 meq | EXTENDED_RELEASE_TABLET | Freq: Every day | ORAL | 0 refills | Status: DC

## 2023-10-31 NOTE — Discharge Instructions (Addendum)
 Your blood test and urine showed that you were mildly dehydrated today.  You were given IV fluids.  Please continue to try to drink or sip regular water is much as possible at home.  I prescribed you 3 different medications to take for nausea.  You can begin by taking Diclegis  at night.  For more severe nausea he can take dissolving Zofran  under the tongue.  If you are still needing extra help, I prescribed you rectal Phenergan  as a rescue medication.  If all of these medications are not able to control your nausea and vomiting, please return to the emergency department.  If your abdominal pain is getting worse, or you begin having fevers, or shaking chills, you should also return to the emergency department.  You can take regular Tylenol  for your back pain.  You can also use over-the-counter muscle rubs and lidocaine  patches.  We should avoid muscle relaxer such as Flexeril , as well as ibuprofen  in early pregnancy.  Discuss any new medications with your primary care doctor or OB/GYN first.

## 2023-10-31 NOTE — ED Triage Notes (Signed)
 Patient has been vomiting for 3 days. Was told yesterday that she is pregnant. Last period 8/4. 3rd time being pregnant. Has left lower abdominal pain.

## 2023-10-31 NOTE — ED Provider Notes (Signed)
 [place] EMERGENCY DEPARTMENT AT [facility] Provider Note   CSN: [id number] Arrival date & time: 10/31/23  9171     Patient presents with: Abdominal Pain   Amy Downs is a 28 y.o. female w/ hx of obesity, chronic back pain, presented to ED with complaint of left lower abdominal pain.  She reports been ongoing for about 3 to 4 days.  She has been nauseated with vomiting and had loose stools yesterday.  She has multiple sick contacts in the house, including her husband and her other children, who have all had cold symptoms this past week.  Patient denies any cough, congestion, sore throat.  She reports he has a flareup of her chronic back pain.  She says she tried taking her Flexeril  but did not help her pain.  She went to urgent care yesterday where she was told she was pregnant.  I reviewed these records she had a positive pregnancy test.  She says her last menstrual period was August 4.  She says this is her third pregnancy, she has 2 living children, no prior history of miscarriages.  She says her first pregnancy was complicated by preeclampsia.  She denies any other ongoing medical issues.  She denies any vaginal discharge.  She denies any vaginal bleeding.  She reports a history of abdominal hernia repair in the inguinal region in the past, and an emergency C-section for ecclampsia in pregnancy.   HPI     Prior to Admission medications   Medication Sig Start Date End Date Taking? Authorizing Provider  Doxylamine -Pyridoxine  10-10 MG TBEC Take 1 tablet by mouth at bedtime for 30 doses. 10/31/23 11/30/23 Yes TRUE Garciamartinez, Donnice PARAS, MD  ondansetron  (ZOFRAN -ODT) 4 MG disintegrating tablet Take 1 tablet (4 mg total) by mouth every 8 (eight) hours as needed for up to 12 doses for nausea or vomiting. 10/31/23  Yes Ayiden Milliman, Donnice PARAS, MD  potassium chloride  (KLOR-CON ) 10 MEQ tablet Take 1 tablet (10 mEq total) by mouth daily for 10 days. 10/31/23 11/10/23 Yes Xariah Silvernail, Donnice PARAS, MD   promethazine  (PHENERGAN ) 25 MG suppository Place 1 suppository (25 mg total) rectally every 8 (eight) hours as needed for up to 6 days for refractory nausea / vomiting. 10/31/23 11/06/23 Yes Leanor Voris, Donnice PARAS, MD    Allergies: Patient has no known allergies.    Review of Systems  Updated Vital Signs BP 127/79 (BP Location: Left Arm)   Pulse 63   Temp 97.7 F (36.5 C) (Oral)   Resp 18   Ht 5' 9 (1.753 m)   Wt 127 kg   LMP 09/22/2023 (Exact Date)   SpO2 100%   BMI 41.35 kg/m   Physical Exam Constitutional:      General: She is not in acute distress.    Appearance: She is obese.  HENT:     Head: Normocephalic and atraumatic.  Eyes:     Conjunctiva/sclera: Conjunctivae normal.     Pupils: Pupils are equal, round, and reactive to light.  Cardiovascular:     Rate and Rhythm: Normal rate and regular rhythm.  Pulmonary:     Effort: Pulmonary effort is normal. No respiratory distress.  Abdominal:     General: There is no distension.     Tenderness: There is abdominal tenderness in the left lower quadrant. There is no guarding or rebound. Negative signs include Murphy's sign.  Genitourinary:    Comments: GU pelvic exam performed with Sydelle Kerns present as chaperone Exam performed with chaperone present. External: Normal  external female genitalia. No lesions, rashes, drainage, or suspicious lymph nodes. Internal: No CMT. Cervix closed and without erythema. Moderate white discharge in vaginal vault. No adnexal tenderness, swelling, or masses. No lacerations. No foreign bodies. Skin:    General: Skin is warm and dry.  Neurological:     General: No focal deficit present.     Mental Status: She is alert. Mental status is at baseline.  Psychiatric:        Mood and Affect: Mood normal.        Behavior: Behavior normal.     (all labs ordered are listed, but only abnormal results are displayed) Labs Reviewed  COMPREHENSIVE METABOLIC PANEL WITH GFR - Abnormal; Notable for the  following components:      Result Value   Potassium 3.3 (*)    CO2 18 (*)    Glucose, Bld 115 (*)    Total Protein 8.2 (*)    Anion gap 16 (*)    All other components within normal limits  URINALYSIS, ROUTINE W REFLEX MICROSCOPIC - Abnormal; Notable for the following components:   Color, Urine AMBER (*)    APPearance CLOUDY (*)    Ketones, ur 80 (*)    Protein, ur 30 (*)    Leukocytes,Ua LARGE (*)    Bacteria, UA FEW (*)    All other components within normal limits  RESP PANEL BY RT-PCR (RSV, FLU A&B, COVID)  RVPGX2  WET PREP, GENITAL  URINE CULTURE  LIPASE, BLOOD  CBC  HCG, QUANTITATIVE, PREGNANCY  GC/CHLAMYDIA PROBE AMP ([place]) NOT AT [facility]    EKG: None  Radiology: US  OB Comp < 14 Wks Result Date: 10/31/2023 CLINICAL DATA:  left sided pelvic pain, 4-[redacted] weeks gestation. EXAM: OBSTETRIC <14 WK US  AND TRANSVAGINAL OB US  TECHNIQUE: Both transabdominal and transvaginal ultrasound examinations were performed for complete evaluation of the gestation as well as the maternal uterus, adnexal regions, and pelvic cul-de-sac. Transvaginal technique was performed to assess early pregnancy. COMPARISON:  None Available. FINDINGS: Intrauterine gestational sac: Single Yolk sac:  Visualized. Embryo:  Visualized. Cardiac Activity: Visualized. Heart Rate: 122 bpm CRL:  3.8 mm   6 w   0 d                  US  EDC: 06/25/2024. Subchorionic hemorrhage: A very small subchorionic hemorrhage is noted occupying less than 20% of the circumference of the gestational sac. Maternal uterus/adnexae: Bilateral ovaries are not distinctly visualized. No adnexal mass seen. IMPRESSION: *Single live intrauterine embryo with crown-rump length corresponding to 6 weeks 0 day. Electronically Signed   By: Ree Molt M.D.   On: 10/31/2023 10:47   US  OB Transvaginal Result Date: 10/31/2023 CLINICAL DATA:  left sided pelvic pain, 4-[redacted] weeks gestation. EXAM: OBSTETRIC <14 WK US  AND TRANSVAGINAL OB US  TECHNIQUE: Both  transabdominal and transvaginal ultrasound examinations were performed for complete evaluation of the gestation as well as the maternal uterus, adnexal regions, and pelvic cul-de-sac. Transvaginal technique was performed to assess early pregnancy. COMPARISON:  None Available. FINDINGS: Intrauterine gestational sac: Single Yolk sac:  Visualized. Embryo:  Visualized. Cardiac Activity: Visualized. Heart Rate: 122 bpm CRL:  3.8 mm   6 w   0 d                  US  EDC: 06/25/2024. Subchorionic hemorrhage: A very small subchorionic hemorrhage is noted occupying less than 20% of the circumference of the gestational sac. Maternal uterus/adnexae: Bilateral ovaries are not distinctly visualized.  No adnexal mass seen. IMPRESSION: *Single live intrauterine embryo with crown-rump length corresponding to 6 weeks 0 day. Electronically Signed   By: Ree Molt M.D.   On: 10/31/2023 10:47     Procedures   Medications Ordered in the ED  pyridOXINE  (VITAMIN B6) tablet 25 mg (25 mg Oral Given 10/31/23 1021)  sodium chloride  0.9 % bolus 1,000 mL (1,000 mLs Intravenous New Bag/Given 10/31/23 1020)  ondansetron  (ZOFRAN ) injection 4 mg (4 mg Intravenous Given 10/31/23 0900)  promethazine  (PHENERGAN ) 12.5 mg in sodium chloride  0.9 % 50 mL IVPB (12.5 mg Intravenous New Bag/Given 10/31/23 1013)  doxylamine  (Sleep) (UNISOM ) tablet 25 mg (25 mg Oral Given 10/31/23 1021)    Clinical Course as of 10/31/23 1234  Fri Oct 31, 2023  0922 Large leukocytes, significant squamous, few bacteria - this is likely a contaminated sample, we'll send a urine culture. Patient denies dysuria [MT]    Clinical Course User Index [MT] [place] Carreno, Donnice PARAS, MD                                 Medical Decision Making Amount and/or Complexity of Data Reviewed Labs: ordered. Radiology: ordered.  Risk OTC drugs. Prescription drug management.   This patient presents to the ED with concern for lower abdominal pain in pregnancy. This involves an  extensive number of treatment options, and is a complaint that carries with it a high risk of complications and morbidity.  The differential diagnosis includes colitis vs UTI vs threatened abortion vs pelvic infection vs ovarian cyst or pathology vs ureteral colic/stone vs viral illness vs other  Co-morbidities that complicate the patient evaluation: obesity risk factor for pregnancy  External records from outside source obtained and reviewed including UC labs yesterday - CMP, CBC unremarkable (WBC 4.4) Hcg quant 23,196 yesterday (approx 4-[redacted] weeks gestation). UA with trace blood and small ketones, neg nitrites and leukocytes  I ordered and personally interpreted labs.  The pertinent results include: No emergent findings.  UA does have ketones and protein.  Very mild anion gap consistent with dehydration.  I ordered imaging studies including pelvic ultrasound I independently visualized and interpreted imaging which showed intrauterine pregnancy I agree with the radiologist interpretation  The patient was maintained on a cardiac monitor.  I personally viewed and interpreted the cardiac monitored which showed an underlying rhythm of: Sinus rhythm  I ordered medication including IV fluids and antiemetic medication  I have reviewed the patients home medicines and have made adjustments as needed  Test Considered: Given improvement of patient's symptoms and reassuring exam, and I did not feel that she needed an emergent CT scan of the abdomen.  I have a lower suspicion for tubo-ovarian abscess, sepsis, PID, appendicitis, or other surgical or infectious etiologies.  After the interventions noted above, I reevaluated the patient and found that they have: improved -feeling much better and tolerating p.o.  I discussed the patient nausea control at home including with Zofran , Diclegis , and a rescue Phenergan  rectally as needed.  We did discuss the pregnancy categories for these medications and potential  risk of pregnancy, but overall I think that the benefits outweigh the risks, given the high risks and clinical concerns for dehydration from the mother.  She is in agreement.  Clinically I suspect that the patient has a viral syndrome, given that she has 3 sick contacts in the house, all with viral type symptoms this week.  I anticipate  improvement of her symptoms over the next few days.  We discussed continuing Tylenol  for her back pain.  Avoiding NSAID medications, muscle relaxers during pregnancy.  Need for follow-up with OB/GYN.  We also discussed the possibility of threatened miscarriage with lower abdominal pain and early pregnancy, and what to expect if this were to happen.  I was able to answer all the patient's questions to her satisfaction at this time  Disposition:  After consideration of the diagnostic results and the patients response to treatment, I feel that the patent would benefit from close outpatient follow-up      Final diagnoses:  Nausea and vomiting, unspecified vomiting type  Lower abdominal pain  Hypokalemia    ED Discharge Orders          Ordered    Doxylamine -Pyridoxine  10-10 MG TBEC  Daily at bedtime        10/31/23 1234    ondansetron  (ZOFRAN -ODT) 4 MG disintegrating tablet  Every 8 hours PRN        10/31/23 1234    potassium chloride  (KLOR-CON ) 10 MEQ tablet  Daily        10/31/23 1234    promethazine  (PHENERGAN ) 25 MG suppository  Every 8 hours PRN        10/31/23 1234               Cottie Donnice PARAS, MD 10/31/23 1234

## 2023-11-01 LAB — URINE CULTURE: Culture: 20000 — AB

## 2023-11-02 ENCOUNTER — Telehealth (HOSPITAL_BASED_OUTPATIENT_CLINIC_OR_DEPARTMENT_OTHER): Payer: Self-pay | Admitting: *Deleted

## 2023-11-02 NOTE — Telephone Encounter (Signed)
 Post ED Visit - Positive Culture Follow-up  Culture report reviewed by antimicrobial stewardship pharmacist: [facility] []  Rankin Dee, Pharm.D. []  Venetia Gully, Pharm.D., BCPS AQ-ID []  Garrel Crews, Pharm.D., BCPS []  Almarie Lunger, [address].D., BCPS []  Ayden, [address].D., BCPS, AAHIVP []  Rosaline Bihari, Pharm.D., BCPS, AAHIVP []  Vernell Meier, PharmD, BCPS []  Latanya Hint, PharmD, BCPS []  Donald Medley, PharmD, BCPS []  Rocky Bold, PharmD []  Dorothyann Alert, PharmD, BCPS []  Morene Babe, PharmD  [facility] []  Rosaline Edison, PharmD []  Romona Bliss, PharmD []  Dolphus Roller, PharmD []  Veva Seip, Rph []  Vernell Daunt) Leonce, PharmD []  Eva Allis, PharmD []  Rosaline Millet, PharmD []  Iantha Batch, PharmD []  Arvin Gauss, PharmD []  Wanda Hasting, PharmD []  Ronal Rav, PharmD []  Rocky Slade, PharmD [x]  Lacinda Moats, PharmD   Positive urine culture Contaminant. No treatment necessary per Elsie Body, MD  Amy Downs 11/02/2023, 11:34 AM

## 2023-11-03 ENCOUNTER — Other Ambulatory Visit: Payer: Self-pay

## 2023-11-03 ENCOUNTER — Encounter (HOSPITAL_COMMUNITY): Payer: Self-pay

## 2023-11-03 ENCOUNTER — Emergency Department (HOSPITAL_COMMUNITY)
Admission: EM | Admit: 2023-11-03 | Discharge: 2023-11-03 | Disposition: A | Discharge status: Home / Self Care | Attending: Emergency Medicine | Admitting: Emergency Medicine

## 2023-11-03 DIAGNOSIS — Z3A01 Less than 8 weeks gestation of pregnancy: Secondary | ICD-10-CM | POA: Diagnosis not present

## 2023-11-03 DIAGNOSIS — O219 Vomiting of pregnancy, unspecified: Secondary | ICD-10-CM | POA: Insufficient documentation

## 2023-11-03 LAB — CBC WITH DIFFERENTIAL/PLATELET
Abs Immature Granulocytes: 0.01 K/uL (ref 0.00–0.07)
Basophils Absolute: 0 K/uL (ref 0.0–0.1)
Basophils Relative: 0 %
Eosinophils Absolute: 0.1 K/uL (ref 0.0–0.5)
Eosinophils Relative: 1 %
HCT: 33.7 % — ABNORMAL LOW (ref 36.0–46.0)
Hemoglobin: 11 g/dL — ABNORMAL LOW (ref 12.0–15.0)
Immature Granulocytes: 0 %
Lymphocytes Relative: 33 %
Lymphs Abs: 1.5 K/uL (ref 0.7–4.0)
MCH: 27.6 pg (ref 26.0–34.0)
MCHC: 32.6 g/dL (ref 30.0–36.0)
MCV: 84.5 fL (ref 80.0–100.0)
Monocytes Absolute: 0.5 K/uL (ref 0.1–1.0)
Monocytes Relative: 11 %
Neutro Abs: 2.6 K/uL (ref 1.7–7.7)
Neutrophils Relative %: 55 %
Platelets: 271 K/uL (ref 150–400)
RBC: 3.99 MIL/uL (ref 3.87–5.11)
RDW: 14.8 % (ref 11.5–15.5)
WBC: 4.6 K/uL (ref 4.0–10.5)
nRBC: 0 % (ref 0.0–0.2)

## 2023-11-03 LAB — GC/CHLAMYDIA PROBE AMP (~~LOC~~) NOT AT ARMC
Chlamydia: NEGATIVE
Comment: NEGATIVE
Comment: NORMAL
Neisseria Gonorrhea: NEGATIVE

## 2023-11-03 LAB — COMPREHENSIVE METABOLIC PANEL WITH GFR
ALT: 10 U/L (ref 0–44)
AST: 14 U/L — ABNORMAL LOW (ref 15–41)
Albumin: 3.6 g/dL (ref 3.5–5.0)
Alkaline Phosphatase: 43 U/L (ref 38–126)
Anion gap: 14 (ref 5–15)
BUN: 6 mg/dL (ref 6–20)
CO2: 20 mmol/L — ABNORMAL LOW (ref 22–32)
Calcium: 9.1 mg/dL (ref 8.9–10.3)
Chloride: 105 mmol/L (ref 98–111)
Creatinine, Ser: 0.79 mg/dL (ref 0.44–1.00)
GFR, Estimated: >60 mL/min (ref >60)
Glucose, Bld: 95 mg/dL (ref 70–99)
Potassium: 3.3 mmol/L — ABNORMAL LOW (ref 3.5–5.1)
Sodium: 139 mmol/L (ref 135–145)
Total Bilirubin: 0.6 mg/dL (ref 0.0–1.2)
Total Protein: 6.7 g/dL (ref 6.5–8.1)

## 2023-11-03 LAB — LIPASE, BLOOD: Lipase: 13 U/L (ref 11–51)

## 2023-11-03 LAB — HCG, QUANTITATIVE, PREGNANCY: hCG, Beta Chain, Quant, S: 36136 m[IU]/mL — ABNORMAL HIGH (ref <5)

## 2023-11-03 MED ORDER — METOCLOPRAMIDE HCL 5 MG/ML IJ SOLN
10.0000 mg | Freq: Once | INTRAMUSCULAR | Status: AC
  Administered 2023-11-03: 10 mg via INTRAVENOUS
  Filled 2023-11-03: qty 2

## 2023-11-03 MED ORDER — ONDANSETRON HCL 4 MG/2ML IJ SOLN
4.0000 mg | Freq: Once | INTRAMUSCULAR | Status: AC
  Administered 2023-11-03: 4 mg via INTRAVENOUS
  Filled 2023-11-03: qty 2

## 2023-11-03 MED ORDER — SENNOSIDES-DOCUSATE SODIUM 8.6-50 MG PO TABS
1.0000 | ORAL_TABLET | Freq: Once | ORAL | Status: AC
  Administered 2023-11-03: 1 via ORAL
  Filled 2023-11-03: qty 1

## 2023-11-03 MED ORDER — LACTATED RINGERS IV BOLUS
1000.0000 mL | Freq: Once | INTRAVENOUS | Status: AC
  Administered 2023-11-03: 1000 mL via INTRAVENOUS

## 2023-11-03 NOTE — Discharge Instructions (Signed)
 You are seen in the ER today for your nausea and vomiting.  You were given fluids and IV nausea medication.  May continue to use the Diclegis , Zofran , and Phenergan  suppositories at home as needed for your nausea.  Please begin also taking a daily stool softener such as Colace or MiraLAX  to relieve her constipation.  Call your OB/GYN to discuss more urgent appointment for management of your severepregnancy related nausea and return to the ER with any severe symptoms.

## 2023-11-03 NOTE — ED Triage Notes (Signed)
 Pt is [redacted] weeks pregnant, hx of hyperemesis gravidarum with last pregnancy, took Diclegis  last night and threw it up. Pt has vomited 4 times in triage.

## 2023-11-03 NOTE — ED Provider Notes (Signed)
 [place] EMERGENCY DEPARTMENT AT [facility] Provider Note   CSN: [id number] Arrival date & time: 11/03/23  0300     Patient presents with: Emesis During Pregnancy   Amy Downs is a 28 y.o. female G3, P2 currently approximately [redacted] weeks pregnant who presents with concern for intractable nausea vomiting.History of hyperemesis gravidarum in her last pregnancy.  Nausea and vomiting refractory to diclegis .  Was previously prescribed Zofran  and ODT and Phenergan  suppositories which she has not been using the phenergan .  Vomiting profusely at home my arrival to the bedside.  History of eclampsia prompting a cesarean delivery with prior pregnancy.   HPI     Prior to Admission medications   Medication Sig Start Date End Date Taking? Authorizing Provider  Doxylamine -Pyridoxine  10-10 MG TBEC Take 1 tablet by mouth at bedtime for 30 doses. 10/31/23 11/30/23  Cottie Donnice PARAS, MD  ondansetron  (ZOFRAN -ODT) 4 MG disintegrating tablet Take 1 tablet (4 mg total) by mouth every 8 (eight) hours as needed for up to 12 doses for nausea or vomiting. 10/31/23   Cottie Donnice PARAS, MD  potassium chloride  (KLOR-CON ) 10 MEQ tablet Take 1 tablet (10 mEq total) by mouth daily for 10 days. 10/31/23 11/10/23  Cottie Donnice PARAS, MD  promethazine  (PHENERGAN ) 25 MG suppository Place 1 suppository (25 mg total) rectally every 8 (eight) hours as needed for up to 6 days for refractory nausea / vomiting. 10/31/23 11/06/23  Cottie Donnice PARAS, MD    Allergies: Patient has no known allergies.    Review of Systems  Constitutional:  Positive for appetite change and fatigue. Negative for fever.  HENT: Negative.    Respiratory: Negative.    Cardiovascular: Negative.   Gastrointestinal:  Positive for nausea and vomiting.  Genitourinary: Negative.   Neurological: Negative.     Updated Vital Signs BP 105/77   Pulse 98   Temp 98 F (36.7 C)   Resp (!) 22   Ht 5' 9 (1.753 m)   Wt 127 kg   LMP 09/22/2023  (Exact Date)   SpO2 100%   BMI 41.35 kg/m   Physical Exam Vitals and nursing note reviewed.  Constitutional:      Appearance: She is not ill-appearing or toxic-appearing.  HENT:     Head: Normocephalic and atraumatic.     Mouth/Throat:     Mouth: Mucous membranes are moist.     Pharynx: No oropharyngeal exudate or posterior oropharyngeal erythema.  Eyes:     General:        Right eye: No discharge.        Left eye: No discharge.     Extraocular Movements: Extraocular movements intact.     Conjunctiva/sclera: Conjunctivae normal.     Pupils: Pupils are equal, round, and reactive to light.  Cardiovascular:     Rate and Rhythm: Normal rate and regular rhythm.     Pulses: Normal pulses.     Heart sounds: Normal heart sounds. No murmur heard. Pulmonary:     Effort: Pulmonary effort is normal. No respiratory distress.     Breath sounds: Normal breath sounds. No wheezing or rales.  Abdominal:     General: Bowel sounds are normal. There is no distension.     Palpations: Abdomen is soft.     Tenderness: There is no abdominal tenderness. There is no guarding or rebound.  Musculoskeletal:        General: No deformity.     Cervical back: Neck supple.     Right  lower leg: No edema.     Left lower leg: No edema.  Skin:    General: Skin is warm and dry.     Capillary Refill: Capillary refill takes less than 2 seconds.  Neurological:     General: No focal deficit present.     Mental Status: She is alert and oriented to person, place, and time. Mental status is at baseline.  Psychiatric:        Mood and Affect: Mood normal.     (all labs ordered are listed, but only abnormal results are displayed) Labs Reviewed  CBC WITH DIFFERENTIAL/PLATELET - Abnormal; Notable for the following components:      Result Value   Hemoglobin 11.0 (*)    HCT 33.7 (*)    All other components within normal limits  COMPREHENSIVE METABOLIC PANEL WITH GFR - Abnormal; Notable for the following components:    Potassium 3.3 (*)    CO2 20 (*)    AST 14 (*)    All other components within normal limits  LIPASE, BLOOD  HCG, QUANTITATIVE, PREGNANCY    EKG: EKG Interpretation Date/Time:  Monday November 03 2023 03:51:42 EDT Ventricular Rate:  96 PR Interval:  148 QRS Duration:  92 QT Interval:  329 QTC Calculation: 416 R Axis:   83  Text Interpretation: Sinus rhythm Ventricular premature complex Aberrant complex No significant change was found Confirmed by Carita Senior [phone]) on 11/03/2023 4:03:01 AM  Radiology: No results found.   Procedures   Medications Ordered in the ED  lactated ringers  bolus 1,000 mL (0 mLs Intravenous Stopped 11/03/23 0637)  ondansetron  (ZOFRAN ) injection 4 mg (4 mg Intravenous Given 11/03/23 0356)  metoCLOPramide  (REGLAN ) injection 10 mg (10 mg Intravenous Given 11/03/23 0440)  senna-docusate (Senokot-S) tablet 1 tablet (1 tablet Oral Given 11/03/23 0647)                                    Medical Decision Making 28 year old female presents with concern for nausea vomiting in pregnancy.  Mildly tachypneic on intake, vital signs otherwise normal.  Cardiopulmonary exam unremarkable, abdominal exam is benign.  Patient vomiting throughout interview.  Amount and/or Complexity of Data Reviewed Labs: ordered.    Details: CBC with anemia hemoglobin 11 at patient's baseline.  CMP hypokalemia of 3.3, otherwise unremarkable.   ECG/medicine tests:     Details: EKG with QTc of 416, sinus rhythm, PVCs.   Risk OTC drugs. Prescription drug management.   Tolerating p.o. after 2 rounds of IV antiemetic.  Regarding her constipation was administered laxative and stool softener in the emergency department and is amenable to plan for discharge home at this time given successful p.o. challenge.  Encouraged her to call her OB/GYN for more urgent follow-up given her history of hyperemesis gravidarum and her severe nausea in this pregnancy. She may continue previously  prescribed ODT Zofran  and Phenergan  suppositories at home as needed for nausea.  Encouraged her to initiate usage of daily stool softeners.  Return precautions given.  Amy Downs voiced understanding of her medical evaluation and treatment plan. Each of their questions answered to their expressed satisfaction.  Return precautions were given.  Patient is well-appearing, stable, and was discharged in good condition.  This chart was dictated using voice recognition software, Dragon. Despite the best efforts of this provider to proofread and correct errors, errors may still occur which can change documentation meaning.      Final  diagnoses:  Nausea and vomiting during pregnancy    ED Discharge Orders     None          Bobette Pleasant JONELLE DEVONNA 11/03/23 9347    Carita Senior, MD 11/03/23 [phone]

## 2023-11-04 ENCOUNTER — Encounter (HOSPITAL_COMMUNITY): Payer: Self-pay | Admitting: *Deleted

## 2023-11-04 ENCOUNTER — Inpatient Hospital Stay (HOSPITAL_COMMUNITY)
Admission: AD | Admit: 2023-11-04 | Discharge: 2023-11-04 | Disposition: A | Discharge status: Home / Self Care | Attending: Obstetrics & Gynecology | Admitting: Obstetrics & Gynecology

## 2023-11-04 DIAGNOSIS — O210 Mild hyperemesis gravidarum: Secondary | ICD-10-CM

## 2023-11-04 DIAGNOSIS — K5900 Constipation, unspecified: Secondary | ICD-10-CM | POA: Diagnosis present

## 2023-11-04 DIAGNOSIS — R1032 Left lower quadrant pain: Secondary | ICD-10-CM | POA: Diagnosis present

## 2023-11-04 DIAGNOSIS — O34211 Maternal care for low transverse scar from previous cesarean delivery: Secondary | ICD-10-CM | POA: Insufficient documentation

## 2023-11-04 DIAGNOSIS — O219 Vomiting of pregnancy, unspecified: Secondary | ICD-10-CM | POA: Diagnosis present

## 2023-11-04 DIAGNOSIS — Z3A01 Less than 8 weeks gestation of pregnancy: Secondary | ICD-10-CM | POA: Diagnosis not present

## 2023-11-04 LAB — URINALYSIS, ROUTINE W REFLEX MICROSCOPIC
Bilirubin Urine: NEGATIVE
Glucose, UA: NEGATIVE mg/dL
Hgb urine dipstick: NEGATIVE
Ketones, ur: 20 mg/dL — AB
Leukocytes,Ua: NEGATIVE
Nitrite: NEGATIVE
Protein, ur: 30 mg/dL — AB
Specific Gravity, Urine: 1.026 (ref 1.005–1.030)
pH: 8 (ref 5.0–8.0)

## 2023-11-04 MED ORDER — LACTATED RINGERS IV BOLUS
1000.0000 mL | Freq: Once | INTRAVENOUS | Status: AC
  Administered 2023-11-04: 1000 mL via INTRAVENOUS

## 2023-11-04 MED ORDER — METOCLOPRAMIDE HCL 5 MG/ML IJ SOLN
10.0000 mg | Freq: Once | INTRAMUSCULAR | Status: AC
  Administered 2023-11-04: 10 mg via INTRAVENOUS
  Filled 2023-11-04: qty 2

## 2023-11-04 MED ORDER — POLYETHYLENE GLYCOL 3350 17 GM/SCOOP PO POWD: ORAL | 11 refills | Status: AC

## 2023-11-04 MED ORDER — SCOPOLAMINE 1 MG/3DAYS TD PT72: 1.0000 | MEDICATED_PATCH | TRANSDERMAL | 12 refills | Status: DC

## 2023-11-04 MED ORDER — SCOPOLAMINE 1 MG/3DAYS TD PT72
1.0000 | MEDICATED_PATCH | TRANSDERMAL | Status: DC
  Administered 2023-11-04: 1 mg via TRANSDERMAL
  Filled 2023-11-04: qty 1

## 2023-11-04 MED ORDER — METOCLOPRAMIDE HCL 10 MG PO TABS: 10.0000 mg | ORAL_TABLET | Freq: Four times a day (QID) | ORAL | 5 refills | Status: DC

## 2023-11-04 MED ORDER — FAMOTIDINE IN NACL 20-0.9 MG/50ML-% IV SOLN
20.0000 mg | Freq: Once | INTRAVENOUS | Status: AC
  Administered 2023-11-04: 20 mg via INTRAVENOUS
  Filled 2023-11-04: qty 50

## 2023-11-04 MED ORDER — ONDANSETRON HCL 4 MG/2ML IJ SOLN
4.0000 mg | Freq: Once | INTRAMUSCULAR | Status: AC
  Administered 2023-11-04: 4 mg via INTRAVENOUS
  Filled 2023-11-04: qty 2

## 2023-11-04 MED ORDER — PROMETHAZINE HCL 25 MG PO TABS: 25.0000 mg | ORAL_TABLET | Freq: Four times a day (QID) | ORAL | 2 refills | Status: DC | PRN

## 2023-11-04 NOTE — Discharge Instructions (Signed)
 NAUSEA: We recommend eating within an hour of getting up, some people even eat a few crackers before getting out of bed. Then make sure you eat at least something small every 2-3 hours, focusing on protein and bland foods. Going too long between meals can make the nausea worse. Also, try not to drink a lot of fluids on an empty stomach if you feel nauseas, this may just make it worse! Peppermint essential oil: you can keep this with you to smell or put a little behind your ears when you're nauseas Ginger candy: I specifically like the [employer] Belly Comfort. You can search online for morning sickness drops/candy and find other things! You can also try Ginger Ale or ginger teas. Frozen fruits or popsicles Start using scopolamine  patches, use one patch for 3 days and then remove and replace. Start Reglan  every 6 hours for nausea. You can use Phenergan  as needed for breakthrough nausea not controlled by scopolamine  and Reglan .  If you cannot keep anything down, you can place Phenergan  or Reglan  intravaginally.  CONSTIPATION: Start using MiraLax  once daily. Continue drinking a lot of water (enough that your urine is a pale yellow color) and eating at least 25 grams of fiber each day.

## 2023-11-04 NOTE — MAU Note (Signed)
 Amy Downs is a 28 y.o. at [redacted]w[redacted]d here in MAU reporting: hx of hyperemesis with first preg.  Has been having ongoing vomiting, cramping LLQ, hunger pains, nausea pains, has only been able to eat a couple of bites a day. Last took phenergan  supp 0500 this mornng, diclegis  at 2200, zofran  1600 yesterday afternoon. Discussed importance of staying on top of meds. No bleeding(has not had any- discussed subchorionic hem).  Denies diarrhea or fever, has  been constipated, last BM this morning after using suppository Onset of complaint: ongoing Pain score: mild LLQ cramping Vitals:   11/04/23 0726 11/04/23 0800  BP: (!) 131/90 130/67  Pulse: 98 66  Resp: 18   Temp: 97.7 F (36.5 C)   SpO2: 100%       Lab orders placed from triage:  urine

## 2023-11-04 NOTE — MAU Provider Note (Signed)
 Chief Complaint:  Emesis and Abdominal Pain   HPI   None     Amy Downs is a 28 y.o. [id number] at [redacted]w[redacted]d who presents to maternity admissions reporting hyperemesis gravidarum. She has had HG in previous pregnancies as well. She has been taking Diclegis  nightly without improvement. She has not been able to eat anything for 4 days. She was seen at [facility] ED yesterday and reports that the Zofran  and Reglan  that she was given IV worked well, but she only has Phenergan  suppositories at home. The Phenergan  suppository she took at 0500 caused a bowel movement. She also notes constipation for several days, her stool this morning was still hard.  Pregnancy Course: Receives care at [facility] for [facility] . Initial prenatal appointment scheduled in about 1 month.  Past Medical History:  Diagnosis Date   Abscess    Anxiety    no meds   HSV-2 infection    Medical history non-contributory    OB History  Gravida Para Term Preterm AB Living  3 2 1 1  3   SAB IAB Ectopic Multiple Live Births     1 3    # Outcome Date GA Lbr Len/2nd Weight Sex Type Anes PTL Lv  3 Current           2A Preterm  [redacted]w[redacted]d    CS-LTranv   LIV  2B Preterm      CS-LTranv   LIV  1 Term  [redacted]w[redacted]d    VBAC   LIV   Past Surgical History:  Procedure Laterality Date   CESAREAN SECTION     twin IUP with PreE   HERNIA REPAIR     WISDOM TOOTH EXTRACTION     Family History  Problem Relation Age of Onset   Heart disease Father    Social History   Tobacco Use   Smoking status: Never   Smokeless tobacco: Never  Vaping Use   Vaping status: Never Used  Substance Use Topics   Alcohol use: No   Drug use: Yes    Types: Marijuana   Allergies  Allergen Reactions   Pollen Extract Itching   No medications prior to admission.    I have reviewed patient's Past Medical Hx, Surgical Hx, Family Hx, Social Hx, medications and allergies.   ROS  Pertinent items noted in HPI and remainder of  comprehensive ROS otherwise negative.   PHYSICAL EXAM  Patient Vitals for the past 24 hrs:  BP Temp Temp src Pulse Resp SpO2 Height Weight  11/04/23 0945 109/60 -- -- 64 -- -- -- --  11/04/23 0800 130/67 -- -- 66 -- -- -- --  11/04/23 0726 (!) 131/90 97.7 F (36.5 C) Axillary 98 18 100 % 5' 9 (1.753 m) 122.3 kg     Constitutional: Well-developed, well-nourished female in no acute distress.  HEENT: atraumatic, normocephalic. Neck has normal ROM. EOM intact. Cardiovascular: normal rate & rhythm, warm and well-perfused Respiratory: normal effort, no problems with respiration noted GI: Abd soft, non-tender, non-distended MSK: Extremities nontender, no edema, normal ROM Skin: warm and dry. Acyanotic, no jaundice or pallor. Neurologic: Alert and oriented x 4. No abnormal coordination. Psychiatric: Normal mood. Speech not slurred, not rapid/pressured. Patient is cooperative. GU: no CVA tenderness  Labs: Results for orders placed or performed during the hospital encounter of 11/04/23 (from the past 24 hours)  Urinalysis, Routine w reflex microscopic -Urine, Clean Catch     Status: Abnormal   Collection Time: 11/04/23  7:30 AM  Result Value Ref Range   Color, Urine YELLOW YELLOW   APPearance HAZY (A) CLEAR   Specific Gravity, Urine 1.026 1.005 - 1.030   pH 8.0 5.0 - 8.0   Glucose, UA NEGATIVE NEGATIVE mg/dL   Hgb urine dipstick NEGATIVE NEGATIVE   Bilirubin Urine NEGATIVE NEGATIVE   Ketones, ur 20 (A) NEGATIVE mg/dL   Protein, ur 30 (A) NEGATIVE mg/dL   Nitrite NEGATIVE NEGATIVE   Leukocytes,Ua NEGATIVE NEGATIVE   RBC / HPF 0-5 0 - 5 RBC/hpf   WBC, UA 0-5 0 - 5 WBC/hpf   Bacteria, UA RARE (A) NONE SEEN   Squamous Epithelial / HPF 6-10 0 - 5 /HPF   Mucus PRESENT     Imaging:  No results found.  MDM & MAU COURSE  MDM: Moderate  MAU Course: -Vital signs within normal limits. -CMP recently at [facility]. -IV fluids, famotidine , Zofran , and scopolamine  patch. -PO challenge with  ice chips, patient vomited. Will add IV Reglan . -PO challenge with ice chips and then saltine crackers successful. Will send home with Reglan , scopolamine  patches, Phenergan .  Differential diagnosis considered for nausea/vomiting includes but is not limited to: nausea due to pregnancy, hyperemesis gravidarum, viral infection, gastroenteritis  Orders Placed This Encounter  Procedures   Urinalysis, Routine w reflex microscopic -Urine, Clean Catch   Insert peripheral IV   Discharge patient   Meds ordered this encounter  Medications   lactated ringers  bolus 1,000 mL   ondansetron  (ZOFRAN ) injection 4 mg   famotidine  (PEPCID ) IVPB 20 mg premix   scopolamine  (TRANSDERM-SCOP) 1 MG/3DAYS 1 mg   metoCLOPramide  (REGLAN ) injection 10 mg   scopolamine  (TRANSDERM-SCOP) 1 MG/3DAYS    Sig: Place 1 patch (1 mg total) onto the skin every 3 (three) days.    Dispense:  10 patch    Refill:  12   metoCLOPramide  (REGLAN ) 10 MG tablet    Sig: Take 1 tablet (10 mg total) by mouth every 6 (six) hours.    Dispense:  30 tablet    Refill:  5   promethazine  (PHENERGAN ) 25 MG tablet    Sig: Take 1 tablet (25 mg total) by mouth every 6 (six) hours as needed for nausea or vomiting.    Dispense:  30 tablet    Refill:  2   polyethylene glycol powder (GLYCOLAX /MIRALAX ) 17 GM/SCOOP powder    Sig: Use once daily per package instructions.    Dispense:  850 g    Refill:  11    ASSESSMENT   1. Hyperemesis gravidarum   2. [redacted] weeks gestation of pregnancy     PLAN  Discharge home in stable condition with return precautions.  Scheduled scopolamine  patch every 3 days and PO Reglan  every 6 hours. Phenergan  prn for breakthrough nausea/vomiting. Prenatal care as scheduled at [facility] in October.   Follow-up Information     Center for [facility] Follow up.   Specialty: Obstetrics and Gynecology Why: As scheduled for ongoing prenatal care Contact information: [address] [place]  [address] [phone]                 Allergies as of 11/04/2023       Reactions   Pollen Extract Itching        Medication List     STOP taking these medications    promethazine  25 MG suppository Commonly known as: PHENERGAN  Replaced by: promethazine  25 MG tablet  TAKE these medications    Doxylamine -Pyridoxine  10-10 MG Tbec Take 1 tablet by mouth at bedtime for 30 doses.   metoCLOPramide  10 MG tablet Commonly known as: REGLAN  Take 1 tablet (10 mg total) by mouth every 6 (six) hours.   ondansetron  4 MG disintegrating tablet Commonly known as: ZOFRAN -ODT Take 1 tablet (4 mg total) by mouth every 8 (eight) hours as needed for up to 12 doses for nausea or vomiting.   polyethylene glycol powder 17 GM/SCOOP powder Commonly known as: GLYCOLAX /MIRALAX  Use once daily per package instructions.   potassium chloride  10 MEQ tablet Commonly known as: KLOR-CON  Take 1 tablet (10 mEq total) by mouth daily for 10 days.   promethazine  25 MG tablet Commonly known as: PHENERGAN  Take 1 tablet (25 mg total) by mouth every 6 (six) hours as needed for nausea or vomiting. Replaces: promethazine  25 MG suppository   scopolamine  1 MG/3DAYS Commonly known as: TRANSDERM-SCOP Place 1 patch (1 mg total) onto the skin every 3 (three) days. Start taking on: November 07, 2023        Joesph DELENA Sear, [place]

## 2023-11-05 ENCOUNTER — Encounter (HOSPITAL_COMMUNITY): Payer: Self-pay | Admitting: Obstetrics & Gynecology

## 2023-11-05 ENCOUNTER — Inpatient Hospital Stay (HOSPITAL_COMMUNITY)
Admission: AD | Admit: 2023-11-05 | Discharge: 2023-11-05 | Disposition: A | Discharge status: Home / Self Care | Payer: Self-pay | Attending: Obstetrics & Gynecology | Admitting: Obstetrics & Gynecology

## 2023-11-05 DIAGNOSIS — O99281 Endocrine, nutritional and metabolic diseases complicating pregnancy, first trimester: Secondary | ICD-10-CM | POA: Diagnosis present

## 2023-11-05 DIAGNOSIS — O219 Vomiting of pregnancy, unspecified: Secondary | ICD-10-CM

## 2023-11-05 DIAGNOSIS — Z3A01 Less than 8 weeks gestation of pregnancy: Secondary | ICD-10-CM

## 2023-11-05 DIAGNOSIS — E876 Hypokalemia: Secondary | ICD-10-CM

## 2023-11-05 LAB — URINALYSIS, ROUTINE W REFLEX MICROSCOPIC
Bilirubin Urine: NEGATIVE
Glucose, UA: NEGATIVE mg/dL
Hgb urine dipstick: NEGATIVE
Ketones, ur: 80 mg/dL — AB
Leukocytes,Ua: NEGATIVE
Nitrite: NEGATIVE
Protein, ur: 30 mg/dL — AB
Specific Gravity, Urine: 1.021 (ref 1.005–1.030)
pH: 8 (ref 5.0–8.0)

## 2023-11-05 LAB — COMPREHENSIVE METABOLIC PANEL WITH GFR
ALT: 14 U/L (ref 0–44)
AST: 17 U/L (ref 15–41)
Albumin: 3.4 g/dL — ABNORMAL LOW (ref 3.5–5.0)
Alkaline Phosphatase: 41 U/L (ref 38–126)
Anion gap: 14 (ref 5–15)
BUN: 5 mg/dL — ABNORMAL LOW (ref 6–20)
CO2: 19 mmol/L — ABNORMAL LOW (ref 22–32)
Calcium: 9.1 mg/dL (ref 8.9–10.3)
Chloride: 104 mmol/L (ref 98–111)
Creatinine, Ser: 0.94 mg/dL (ref 0.44–1.00)
GFR, Estimated: >60 mL/min (ref >60)
Glucose, Bld: 80 mg/dL (ref 70–99)
Potassium: 3.3 mmol/L — ABNORMAL LOW (ref 3.5–5.1)
Sodium: 137 mmol/L (ref 135–145)
Total Bilirubin: 0.6 mg/dL (ref 0.0–1.2)
Total Protein: 7.5 g/dL (ref 6.5–8.1)

## 2023-11-05 LAB — CBC
HCT: 38 % (ref 36.0–46.0)
Hemoglobin: 12.3 g/dL (ref 12.0–15.0)
MCH: 27.6 pg (ref 26.0–34.0)
MCHC: 32.4 g/dL (ref 30.0–36.0)
MCV: 85.2 fL (ref 80.0–100.0)
Platelets: 306 K/uL (ref 150–400)
RBC: 4.46 MIL/uL (ref 3.87–5.11)
RDW: 14.8 % (ref 11.5–15.5)
WBC: 5.5 K/uL (ref 4.0–10.5)
nRBC: 0 % (ref 0.0–0.2)

## 2023-11-05 LAB — MAGNESIUM: Magnesium: 1.7 mg/dL (ref 1.7–2.4)

## 2023-11-05 MED ORDER — POTASSIUM CHLORIDE CRYS ER 20 MEQ PO TBCR: 20.0000 meq | EXTENDED_RELEASE_TABLET | Freq: Every day | ORAL | 0 refills | Status: DC

## 2023-11-05 MED ORDER — FAMOTIDINE 20 MG PO TABS
20.0000 mg | ORAL_TABLET | Freq: Two times a day (BID) | ORAL | 0 refills | Status: DC | PRN
Start: 2023-11-05 — End: 2023-12-12

## 2023-11-05 MED ORDER — FAMOTIDINE IN NACL 20-0.9 MG/50ML-% IV SOLN
20.0000 mg | Freq: Once | INTRAVENOUS | Status: AC
  Administered 2023-11-05: 20 mg via INTRAVENOUS
  Filled 2023-11-05: qty 50

## 2023-11-05 MED ORDER — GLYCOPYRROLATE 0.2 MG/ML IJ SOLN
0.2000 mg | Freq: Once | INTRAMUSCULAR | Status: AC
  Administered 2023-11-05: 0.2 mg via INTRAVENOUS
  Filled 2023-11-05: qty 1

## 2023-11-05 MED ORDER — METOCLOPRAMIDE HCL 5 MG/ML IJ SOLN
10.0000 mg | Freq: Once | INTRAMUSCULAR | Status: AC
  Administered 2023-11-05: 10 mg via INTRAVENOUS
  Filled 2023-11-05: qty 2

## 2023-11-05 MED ORDER — GLYCOPYRROLATE 2 MG PO TABS: 2.0000 mg | ORAL_TABLET | Freq: Three times a day (TID) | ORAL | 3 refills | Status: DC | PRN

## 2023-11-05 MED ORDER — LACTATED RINGERS IV BOLUS
1000.0000 mL | Freq: Once | INTRAVENOUS | Status: AC
  Administered 2023-11-05: 1000 mL via INTRAVENOUS

## 2023-11-05 NOTE — MAU Provider Note (Signed)
 Event Date/Time   First Provider Initiated Contact with Patient 11/05/23 1614      S [name] is a 28 y.o. [phone] patient who presents to MAU today with complaint of severe nausea and vomiting with inability to tolerate p.o. food or fluids.  Patient states she vomited 10-20 times today and now is just bilious fluids coming up with dry heaving.  Patient is reported to be 6 weeks 2 days pregnant and was seen yesterday in the MAU for similar complaints of nausea and vomiting..  Patient was treated with IV fluids, Pepcid , Zofran , and was given a scopolamine  patch as well as received IV Reglan .  She had also been seen prior on 11/03/2023 at [facility], ED for nausea and vomiting pregnancy as well and was given Zofran  and ODT and labs were obtained CMP was significant for slight hypokalemia at 3.3, CBC was within normal limits with no leukocytosis, and UA consistent with slight dehydration 20 of ketones and 30 proteinuria.   Patient reports that since 4 AM she has been vomiting today and she had tried the Reglan  with no relief, the Zofran , Phenergan  and has a scopolamine  patch in place and still has unresolved hyperemesis.  She denies any obstetrical complaints.  No vaginal bleeding or abdominal cramping at this time  The remainder of the ROS is negative unless otherwise noted in HPI  O BP 137/66 (BP Location: Right Arm)   Pulse 86   Temp 99.5 F (37.5 C) (Oral)   Resp 18   Ht 5' 9 (1.753 m)   Wt 122.2 kg   LMP 09/22/2023 (Exact Date)   SpO2 100%   BMI 39.80 kg/m  Physical Exam Vitals and nursing note reviewed.  Constitutional:      General: She is not in acute distress.    Appearance: She is well-developed. She is obese. She is not ill-appearing.  HENT:     Head: Normocephalic.     Mouth/Throat:     Mouth: Mucous membranes are moist.  Cardiovascular:     Rate and Rhythm: Normal rate.  Pulmonary:     Effort: Pulmonary effort is normal.  Abdominal:     Palpations: Abdomen  is soft.     Tenderness: There is no abdominal tenderness.  Skin:    General: Skin is warm.     Coloration: Skin is not pale.  Neurological:     Mental Status: She is alert and oriented to person, place, and time.  Psychiatric:        Behavior: Behavior normal.     MDM  HIGH  N/V in pregnancy  CBC: NM CMP: ( Potassium at 3.3) Plan for Po supplements Magnesium : NM UA: pending at discharge IVF ordered  Antiemetics PO Challenge tolerated   - Patient reassessed at [facility] and reports feeling better with no additional vomiting and feels ready for discharge. RX's sent to pharmacy for Robinul   and Pepcid  . She already has Zofran , Phenergan , and scopolamine  patch previously sent    Differential diagnosis with nausea/vomiting includes but is not limited to: nausea due to pregnancy, hyperemesis gravidarum, viral infection, gastroenteritis, cholecystitis, pancreatitis, ACS, food poisoning, diabetic ketoacidosis, drug ingestion, gastroparesis, alcohol/opiate withdrawal   Orders Placed This Encounter  Procedures   CBC    Standing Status:   Standing    Number of Occurrences:   1   Comprehensive metabolic panel    Standing Status:   Standing    Number of Occurrences:   1   Magnesium  Standing Status:   Standing    Number of Occurrences:   1   Urinalysis, Routine w reflex microscopic -Urine, Random    Standing Status:   Standing    Number of Occurrences:   1    Specimen Source:   Urine, Random [244]   Discharge patient Discharge disposition: 01-Home or Self Care; Discharge patient date: 11/05/2023    Standing Status:   Standing    Number of Occurrences:   1    Discharge disposition:   01-Home or Self Care [1]    Discharge patient date:   11/05/2023    Meds ordered this encounter  Medications   lactated ringers  bolus 1,000 mL   famotidine  (PEPCID ) IVPB 20 mg premix   glycopyrrolate  (ROBINUL ) injection 0.2 mg   metoCLOPramide  (REGLAN ) injection 10 mg   potassium chloride  SA  (KLOR-CON  M) 20 MEQ tablet    Sig: Take 1 tablet (20 mEq total) by mouth daily for 10 days.    Dispense:  10 tablet    Refill:  0    Supervising Provider:   PRATT, TANYA S [[id number]]   glycopyrrolate  (ROBINUL ) 2 MG tablet    Sig: Take 1 tablet (2 mg total) by mouth 3 (three) times daily as needed.    Dispense:  30 tablet    Refill:  3    Supervising Provider:   PRATT, TANYA S [[id number]]   famotidine  (PEPCID ) 20 MG tablet    Sig: Take 1 tablet (20 mg total) by mouth 2 (two) times daily as needed for heartburn or indigestion.    Dispense:  60 tablet    Refill:  0    Supervising Provider:   PRATT, TANYA S [[id number]]     Results for orders placed or performed during the hospital encounter of 11/05/23 (from the past 24 hours)  CBC     Status: None   Collection Time: 11/05/23  5:08 PM  Result Value Ref Range   WBC 5.5 4.0 - 10.5 K/uL   RBC 4.46 3.87 - 5.11 MIL/uL   Hemoglobin 12.3 12.0 - 15.0 g/dL   HCT 61.9 63.9 - 53.9 %   MCV 85.2 80.0 - 100.0 fL   MCH 27.6 26.0 - 34.0 pg   MCHC 32.4 30.0 - 36.0 g/dL   RDW 85.1 88.4 - 84.4 %   Platelets 306 150 - 400 K/uL   nRBC 0.0 0.0 - 0.2 %  Comprehensive metabolic panel     Status: Abnormal   Collection Time: 11/05/23  5:08 PM  Result Value Ref Range   Sodium 137 135 - 145 mmol/L   Potassium 3.3 (L) 3.5 - 5.1 mmol/L   Chloride 104 98 - 111 mmol/L   CO2 19 (L) 22 - 32 mmol/L   Glucose, Bld 80 70 - 99 mg/dL   BUN 5 (L) 6 - 20 mg/dL   Creatinine, Ser 9.05 0.44 - 1.00 mg/dL   Calcium 9.1 8.9 - 89.6 mg/dL   Total Protein 7.5 6.5 - 8.1 g/dL   Albumin 3.4 (L) 3.5 - 5.0 g/dL   AST 17 15 - 41 U/L   ALT 14 0 - 44 U/L   Alkaline Phosphatase 41 38 - 126 U/L   Total Bilirubin 0.6 0.0 - 1.2 mg/dL   GFR, Estimated >39 >39 mL/min   Anion gap 14 5 - 15  Magnesium     Status: None   Collection Time: 11/05/23  5:08 PM  Result Value Ref Range   Magnesium 1.7 1.7 -  2.4 mg/dL       I have reviewed the patient chart and performed the physical exam . I have  ordered & interpreted the lab results and reviewed them with the patient  Medications ordered as stated above/below.  A/P as described below.  Counseling and education provided and patient agreeable  with plan as described below. Verbalized understanding.    ASSESSMENT Medical screening exam complete  Nausea and vomiting during pregnancy  [redacted] weeks gestation of pregnancy  Hypokalemia    PLAN Future Appointments  Date Time Provider Department Center  12/04/2023  9:15 AM [facility] INTAKE [facility]  12/08/2023  8:55 AM Wallace Joesph LABOR, PA [facility]    Discharge from MAU in stable condition  See AVS for full description of educational information and instructions provided to the patient at time of discharge   Warning signs for worsening condition that would warrant emergency follow-up discussed Patient may return to MAU as needed   Littie Olam LABOR, NP 11/05/2023 6:57 PM   This chart was dictated using voice recognition software, Dragon. Despite the best efforts of this provider to proofread and correct errors, errors may still occur which can change documentation meaning.

## 2023-11-05 NOTE — MAU Note (Signed)
 Amy Downs is a 28 y.o. at [redacted]w[redacted]d here in MAU reporting: ongoing vomiting. Constipated, last was yesterday after taking promethazine  supp, wasn't a lot.   Onset of complaint: ongoing problems Pain score: lower abd  8 Vitals:   11/05/23 1520  BP: 137/66  Pulse: 86  Resp: 18  Temp: 99.5 F (37.5 C)  SpO2: 100%      Lab orders placed from triage:

## 2023-11-10 ENCOUNTER — Encounter (HOSPITAL_COMMUNITY): Payer: Self-pay | Admitting: Obstetrics and Gynecology

## 2023-11-10 ENCOUNTER — Other Ambulatory Visit: Payer: Self-pay

## 2023-11-10 ENCOUNTER — Inpatient Hospital Stay (HOSPITAL_COMMUNITY)
Admission: AD | Admit: 2023-11-10 | Discharge: 2023-11-10 | Disposition: A | Discharge status: Home / Self Care | Payer: Self-pay

## 2023-11-10 ENCOUNTER — Other Ambulatory Visit: Payer: Self-pay | Admitting: Obstetrics and Gynecology

## 2023-11-10 DIAGNOSIS — O210 Mild hyperemesis gravidarum: Secondary | ICD-10-CM | POA: Diagnosis not present

## 2023-11-10 DIAGNOSIS — Z3A01 Less than 8 weeks gestation of pregnancy: Secondary | ICD-10-CM | POA: Diagnosis not present

## 2023-11-10 DIAGNOSIS — Z8759 Personal history of other complications of pregnancy, childbirth and the puerperium: Secondary | ICD-10-CM | POA: Insufficient documentation

## 2023-11-10 DIAGNOSIS — K5900 Constipation, unspecified: Secondary | ICD-10-CM | POA: Diagnosis present

## 2023-11-10 DIAGNOSIS — O34211 Maternal care for low transverse scar from previous cesarean delivery: Secondary | ICD-10-CM | POA: Diagnosis not present

## 2023-11-10 DIAGNOSIS — O219 Vomiting of pregnancy, unspecified: Secondary | ICD-10-CM | POA: Diagnosis present

## 2023-11-10 DIAGNOSIS — R109 Unspecified abdominal pain: Secondary | ICD-10-CM | POA: Diagnosis present

## 2023-11-10 LAB — COMPREHENSIVE METABOLIC PANEL WITH GFR
ALT: 21 U/L (ref 0–44)
AST: 29 U/L (ref 15–41)
Albumin: 3.5 g/dL (ref 3.5–5.0)
Alkaline Phosphatase: 39 U/L (ref 38–126)
Anion gap: 11 (ref 5–15)
BUN: 5 mg/dL — ABNORMAL LOW (ref 6–20)
CO2: 19 mmol/L — ABNORMAL LOW (ref 22–32)
Calcium: 9.3 mg/dL (ref 8.9–10.3)
Chloride: 105 mmol/L (ref 98–111)
Creatinine, Ser: 0.89 mg/dL (ref 0.44–1.00)
GFR, Estimated: >60 mL/min (ref >60)
Glucose, Bld: 91 mg/dL (ref 70–99)
Potassium: 4.2 mmol/L (ref 3.5–5.1)
Sodium: 135 mmol/L (ref 135–145)
Total Bilirubin: 1.5 mg/dL — ABNORMAL HIGH (ref 0.0–1.2)
Total Protein: 7.4 g/dL (ref 6.5–8.1)

## 2023-11-10 LAB — URINALYSIS, ROUTINE W REFLEX MICROSCOPIC
Glucose, UA: NEGATIVE mg/dL
Hgb urine dipstick: NEGATIVE
Ketones, ur: 20 mg/dL — AB
Leukocytes,Ua: NEGATIVE
Nitrite: NEGATIVE
Protein, ur: 100 mg/dL — AB
Specific Gravity, Urine: 1.033 — ABNORMAL HIGH (ref 1.005–1.030)
pH: 7 (ref 5.0–8.0)

## 2023-11-10 LAB — AMYLASE: Amylase: 167 U/L — ABNORMAL HIGH (ref 28–100)

## 2023-11-10 MED ORDER — LACTATED RINGERS IV BOLUS
1000.0000 mL | Freq: Once | INTRAVENOUS | Status: AC
  Administered 2023-11-10: 1000 mL via INTRAVENOUS

## 2023-11-10 MED ORDER — POTASSIUM CHLORIDE 10 MEQ/100ML IV SOLN
10.0000 meq | INTRAVENOUS | Status: AC
  Administered 2023-11-10 (×2): 10 meq via INTRAVENOUS
  Filled 2023-11-10 (×2): qty 100

## 2023-11-10 MED ORDER — DEXAMETHASONE SODIUM PHOSPHATE 10 MG/ML IJ SOLN
10.0000 mg | Freq: Once | INTRAMUSCULAR | Status: AC
  Administered 2023-11-10: 10 mg via INTRAVENOUS
  Filled 2023-11-10: qty 1

## 2023-11-10 MED ORDER — HALOPERIDOL LACTATE 5 MG/ML IJ SOLN
5.0000 mg | Freq: Four times a day (QID) | INTRAMUSCULAR | Status: DC | PRN
  Administered 2023-11-10: 5 mg via INTRAVENOUS
  Filled 2023-11-10: qty 1

## 2023-11-10 MED ORDER — LACTATED RINGERS IV BOLUS: 500.0000 mL | Freq: Once | INTRAVENOUS | Status: DC

## 2023-11-10 NOTE — MAU Provider Note (Addendum)
 Chief Complaint:  Nausea, Emesis, and Abdominal Pain   HPI   Amy Downs is a 28 y.o. [id number]  at [redacted]w[redacted]d presenting for nausea and vomiting. She has been seen for the same complaint 5 times in the last week and a half. She was last seen here on 9/17. Her support person states that she has been unable to keep anything down since that time. She has tried eating crackers, green beans, and last night she had tacos. She said it has been mostly bile and that sometimes she is just dry heaving. Patient does admit to marijuana use, but states she has not used any in 5 weeks when she found out it was making her nausea and vomiting worse. Patient has multiple anti-emetics at home. She last took promethazine  this morning, but threw it up. She took Reglan  and Zofran  yesterday. She currently has a scopolamine  patch on. Patient has also not had a bowel movement in over 3 days.    Additional history obtained from support person.  Pregnancy Course: Receives care at [facility] for [facility] . Prenatal records reviewed.   Past Medical History:  Diagnosis Date   Abscess    Anxiety    no meds   HSV-2 infection    Medical history non-contributory    OB History  Gravida Para Term Preterm AB Living  3 2 1 1  3   SAB IAB Ectopic Multiple Live Births     1 3    # Outcome Date GA Lbr Len/2nd Weight Sex Type Anes PTL Lv  3 Current           2A Preterm  [redacted]w[redacted]d    CS-LTranv   LIV  2B Preterm      CS-LTranv   LIV  1 Term  [redacted]w[redacted]d    VBAC   LIV   Past Surgical History:  Procedure Laterality Date   CESAREAN SECTION     twin IUP with PreE   HERNIA REPAIR     WISDOM TOOTH EXTRACTION     Family History  Problem Relation Age of Onset   Heart disease Father    Social History   Tobacco Use   Smoking status: Never   Smokeless tobacco: Never  Vaping Use   Vaping status: Never Used  Substance Use Topics   Alcohol use: No   Drug use: Not Currently    Types: Marijuana    Comment: quit  4-5 days ago   Allergies  Allergen Reactions   Pollen Extract Itching   Medications Prior to Admission  Medication Sig Dispense Refill Last Dose/Taking   metoCLOPramide  (REGLAN ) 10 MG tablet Take 1 tablet (10 mg total) by mouth every 6 (six) hours. 30 tablet 5 11/09/2023   promethazine  (PHENERGAN ) 25 MG tablet Take 1 tablet (25 mg total) by mouth every 6 (six) hours as needed for nausea or vomiting. 30 tablet 2 11/10/2023 Morning   scopolamine  (TRANSDERM-SCOP) 1 MG/3DAYS Place 1 patch (1 mg total) onto the skin every 3 (three) days. 10 patch 12 11/10/2023 Morning   Doxylamine -Pyridoxine  10-10 MG TBEC Take 1 tablet by mouth at bedtime for 30 doses. 30 tablet 0    famotidine  (PEPCID ) 20 MG tablet Take 1 tablet (20 mg total) by mouth 2 (two) times daily as needed for heartburn or indigestion. 60 tablet 0    glycopyrrolate  (ROBINUL ) 2 MG tablet Take 1 tablet (2 mg total) by mouth 3 (three) times daily as needed. 30 tablet 3    ondansetron  (  ZOFRAN -ODT) 4 MG disintegrating tablet Take 1 tablet (4 mg total) by mouth every 8 (eight) hours as needed for up to 12 doses for nausea or vomiting. 12 tablet 0    polyethylene glycol powder (GLYCOLAX /MIRALAX ) 17 GM/SCOOP powder Use once daily per package instructions. 850 g 11    potassium chloride  (KLOR-CON ) 10 MEQ tablet Take 1 tablet (10 mEq total) by mouth daily for 10 days. 10 tablet 0    potassium chloride  SA (KLOR-CON  M) 20 MEQ tablet Take 1 tablet (20 mEq total) by mouth daily for 10 days. 10 tablet 0     I have reviewed patient's Past Medical Hx, Surgical Hx, Family Hx, Social Hx, medications and allergies.   ROS  Pertinent items noted in HPI and remainder of comprehensive ROS otherwise negative.   PHYSICAL EXAM  Patient Vitals for the past 24 hrs:  BP Temp Temp src Pulse Resp Weight  11/10/23 0826 114/89 98.6 F (37 C) Oral 92 20 119.8 kg    Constitutional: ill-appearing, mild distress HEENT: atraumatic, normocephalic. Neck has normal ROM. EOM  intact grossly. Cardiovascular: normal rate & rhythm, warm and well-perfused Respiratory: normal effort, no problems with respiration noted MSK: Extremities nontender, no edema, normal ROM Skin: warm and dry. Acyanotic, no jaundice or pallor. Neurologic: Alert and oriented. No abnormal coordination. Psychiatric: Normal mood. Speech not slurred, not rapid/pressured. Patient is cooperative. GU: no CVA tenderness   Labs: Results for orders placed or performed during the hospital encounter of 11/10/23 (from the past 24 hours)  Comprehensive metabolic panel with GFR     Status: Abnormal   Collection Time: 11/10/23  8:51 AM  Result Value Ref Range   Sodium 135 135 - 145 mmol/L   Potassium 4.2 3.5 - 5.1 mmol/L   Chloride 105 98 - 111 mmol/L   CO2 19 (L) 22 - 32 mmol/L   Glucose, Bld 91 70 - 99 mg/dL   BUN 5 (L) 6 - 20 mg/dL   Creatinine, Ser 9.10 0.44 - 1.00 mg/dL   Calcium 9.3 8.9 - 89.6 mg/dL   Total Protein 7.4 6.5 - 8.1 g/dL   Albumin 3.5 3.5 - 5.0 g/dL   AST 29 15 - 41 U/L   ALT 21 0 - 44 U/L   Alkaline Phosphatase 39 38 - 126 U/L   Total Bilirubin 1.5 (H) 0.0 - 1.2 mg/dL   GFR, Estimated >39 >39 mL/min   Anion gap 11 5 - 15  Urinalysis, Routine w reflex microscopic -Urine, Clean Catch     Status: Abnormal   Collection Time: 11/10/23 10:43 AM  Result Value Ref Range   Color, Urine AMBER (A) YELLOW   APPearance HAZY (A) CLEAR   Specific Gravity, Urine 1.033 (H) 1.005 - 1.030   pH 7.0 5.0 - 8.0   Glucose, UA NEGATIVE NEGATIVE mg/dL   Hgb urine dipstick NEGATIVE NEGATIVE   Bilirubin Urine SMALL (A) NEGATIVE   Ketones, ur 20 (A) NEGATIVE mg/dL   Protein, ur 899 (A) NEGATIVE mg/dL   Nitrite NEGATIVE NEGATIVE   Leukocytes,Ua NEGATIVE NEGATIVE   RBC / HPF 0-5 0 - 5 RBC/hpf   WBC, UA 0-5 0 - 5 WBC/hpf   Bacteria, UA FEW (A) NONE SEEN   Squamous Epithelial / HPF 6-10 0 - 5 /HPF   Mucus PRESENT     Imaging:  No results found.   MDM & MAU COURSE  MDM: High  MAU  Course: Patient presented to the MAU with hyperemesis. She has failed multiple  modalities outpatient. She has consistently had K+ of 3.3 over the last week. Today, patient received fluids and K+ repletion. Haldol  and decadron  were given patient has a history of marijuana use and n/v have persisted despite other antiemetic measures. Nausea and vomiting improved after the medications. Patient tolerated p.o. intake. Patient was set up with an outpatient infusion center for hyperemesis.  Patient stable at time of discharge with return precautions given.   Differential diagnosis considered for nausea/vomiting includes but is not limited to: nausea due to pregnancy, hyperemesis gravidarum, viral infection, gastroenteritis, cholecystitis, pancreatitis, ACS, food poisoning, diabetic ketoacidosis, drug ingestion, gastroparesis, alcohol/opiate withdrawal, cannabinoid hyperemesis.  Orders Placed This Encounter  Procedures   Urinalysis, Routine w reflex microscopic -Urine, Clean Catch   Comprehensive metabolic panel with GFR   Lipase, blood   Amylase   Discharge patient Discharge disposition: 01-Home or Self Care; Discharge patient date: 11/10/2023   Meds ordered this encounter  Medications   lactated ringers  bolus 1,000 mL   potassium chloride  10 mEq in 100 mL IVPB   haloperidol  lactate (HALDOL ) injection 5 mg   dexamethasone  (DECADRON ) injection 10 mg   DISCONTD: lactated ringers  bolus 500 mL   lactated ringers  bolus 1,000 mL    ASSESSMENT   1. Hyperemesis affecting pregnancy, antepartum   2. [redacted] weeks gestation of pregnancy     PLAN  Discharge home in stable condition with return precautions.   - Outpatient infusion center set up for hyperemesis gravidarum - Continue home anti-nausea medications  Follow-up Information     [facility] Follow up.   Specialty: Infusion Therapy Contact information: [address] [place]   [address] [phone]                 Allergies as of 11/10/2023       Reactions   Pollen Extract Itching        Medication List     TAKE these medications    Doxylamine -Pyridoxine  10-10 MG Tbec Take 1 tablet by mouth at bedtime for 30 doses.   famotidine  20 MG tablet Commonly known as: Pepcid  Take 1 tablet (20 mg total) by mouth 2 (two) times daily as needed for heartburn or indigestion.   glycopyrrolate  2 MG tablet Commonly known as: ROBINUL  Take 1 tablet (2 mg total) by mouth 3 (three) times daily as needed.   metoCLOPramide  10 MG tablet Commonly known as: REGLAN  Take 1 tablet (10 mg total) by mouth every 6 (six) hours.   ondansetron  4 MG disintegrating tablet Commonly known as: ZOFRAN -ODT Take 1 tablet (4 mg total) by mouth every 8 (eight) hours as needed for up to 12 doses for nausea or vomiting.   polyethylene glycol powder 17 GM/SCOOP powder Commonly known as: GLYCOLAX /MIRALAX  Use once daily per package instructions.   potassium chloride  10 MEQ tablet Commonly known as: KLOR-CON  Take 1 tablet (10 mEq total) by mouth daily for 10 days.   potassium chloride  SA 20 MEQ tablet Commonly known as: KLOR-CON  M Take 1 tablet (20 mEq total) by mouth daily for 10 days.   promethazine  25 MG tablet Commonly known as: PHENERGAN  Take 1 tablet (25 mg total) by mouth every 6 (six) hours as needed for nausea or vomiting.   scopolamine  1 MG/3DAYS Commonly known as: TRANSDERM-SCOP Place 1 patch (1 mg total) onto the skin every 3 (three) days.        Raguel KANDICE Lee, DO

## 2023-11-10 NOTE — MAU Note (Signed)
..  Amy Downs is a 28 y.o. at [redacted]w[redacted]d here in MAU reporting: Nausea and vomiting this morning that started at 0500 this morning. Has been unable to keep anything down. Denies vaginal bleeding. Does have generalized abdominal pain. Has not pooped in several days, has been taking miralax  and stool softener.  Promethazine - 0600 (threw it up) Reglan - last night  Zofran - yesterday Scopolamine - on   Pain score: 6 Vitals:   11/10/23 0826  BP: 114/89  Pulse: 92  Resp: 20  Temp: 98.6 F (37 C)     FHT:n/a Lab orders placed from triage:   UA

## 2023-11-10 NOTE — MAU Note (Signed)
 At 2045 RN answer call from lab tech, Ryan, stating they could not process the pt's lipase due to it being hemolyzed. Pt had been d/c earlier this afternoon and not available  ordering doctor not available GLENWOOD Ban, MD made aware.

## 2023-11-16 ENCOUNTER — Other Ambulatory Visit: Payer: Self-pay

## 2023-11-16 ENCOUNTER — Inpatient Hospital Stay (HOSPITAL_COMMUNITY)
Admission: AD | Admit: 2023-11-16 | Discharge: 2023-11-16 | Disposition: A | Discharge status: Home / Self Care | Attending: Obstetrics and Gynecology | Admitting: Obstetrics and Gynecology

## 2023-11-16 DIAGNOSIS — Z3A01 Less than 8 weeks gestation of pregnancy: Secondary | ICD-10-CM

## 2023-11-16 DIAGNOSIS — O210 Mild hyperemesis gravidarum: Secondary | ICD-10-CM | POA: Diagnosis not present

## 2023-11-16 DIAGNOSIS — O211 Hyperemesis gravidarum with metabolic disturbance: Secondary | ICD-10-CM | POA: Diagnosis present

## 2023-11-16 DIAGNOSIS — O99611 Diseases of the digestive system complicating pregnancy, first trimester: Secondary | ICD-10-CM | POA: Insufficient documentation

## 2023-11-16 DIAGNOSIS — K5900 Constipation, unspecified: Secondary | ICD-10-CM | POA: Insufficient documentation

## 2023-11-16 LAB — URINALYSIS, ROUTINE W REFLEX MICROSCOPIC
Glucose, UA: NEGATIVE mg/dL
Hgb urine dipstick: NEGATIVE
Ketones, ur: 80 mg/dL — AB
Nitrite: NEGATIVE
Protein, ur: 100 mg/dL — AB
Specific Gravity, Urine: 1.029 (ref 1.005–1.030)
pH: 5 (ref 5.0–8.0)

## 2023-11-16 LAB — COMPREHENSIVE METABOLIC PANEL WITH GFR
ALT: 25 U/L (ref 0–44)
AST: 22 U/L (ref 15–41)
Albumin: 3.4 g/dL — ABNORMAL LOW (ref 3.5–5.0)
Alkaline Phosphatase: 36 U/L — ABNORMAL LOW (ref 38–126)
Anion gap: 12 (ref 5–15)
BUN: 6 mg/dL (ref 6–20)
CO2: 18 mmol/L — ABNORMAL LOW (ref 22–32)
Calcium: 9 mg/dL (ref 8.9–10.3)
Chloride: 103 mmol/L (ref 98–111)
Creatinine, Ser: 0.8 mg/dL (ref 0.44–1.00)
GFR, Estimated: >60 mL/min (ref >60)
Glucose, Bld: 92 mg/dL (ref 70–99)
Potassium: 3.4 mmol/L — ABNORMAL LOW (ref 3.5–5.1)
Sodium: 133 mmol/L — ABNORMAL LOW (ref 135–145)
Total Bilirubin: 0.8 mg/dL (ref 0.0–1.2)
Total Protein: 7.3 g/dL (ref 6.5–8.1)

## 2023-11-16 LAB — CBC
HCT: 38.5 % (ref 36.0–46.0)
Hemoglobin: 12.8 g/dL (ref 12.0–15.0)
MCH: 27.7 pg (ref 26.0–34.0)
MCHC: 33.2 g/dL (ref 30.0–36.0)
MCV: 83.3 fL (ref 80.0–100.0)
Platelets: 302 K/uL (ref 150–400)
RBC: 4.62 MIL/uL (ref 3.87–5.11)
RDW: 14.4 % (ref 11.5–15.5)
WBC: 6.3 K/uL (ref 4.0–10.5)
nRBC: 0 % (ref 0.0–0.2)

## 2023-11-16 MED ORDER — ONDANSETRON HCL 4 MG/2ML IJ SOLN
4.0000 mg | Freq: Once | INTRAMUSCULAR | Status: AC
  Administered 2023-11-16: 4 mg via INTRAVENOUS
  Filled 2023-11-16: qty 2

## 2023-11-16 MED ORDER — METOCLOPRAMIDE HCL 10 MG PO TABS: 10.0000 mg | ORAL_TABLET | Freq: Four times a day (QID) | ORAL | 5 refills | Status: DC

## 2023-11-16 MED ORDER — ONDANSETRON 4 MG PO TBDP
4.0000 mg | ORAL_TABLET | Freq: Three times a day (TID) | ORAL | 3 refills | Status: AC | PRN
Start: 2023-11-16 — End: ?

## 2023-11-16 MED ORDER — LACTATED RINGERS IV BOLUS
1000.0000 mL | Freq: Once | INTRAVENOUS | Status: AC
  Administered 2023-11-16: 1000 mL via INTRAVENOUS

## 2023-11-16 MED ORDER — FAMOTIDINE IN NACL 20-0.9 MG/50ML-% IV SOLN
20.0000 mg | Freq: Once | INTRAVENOUS | Status: AC
  Administered 2023-11-16: 20 mg via INTRAVENOUS
  Filled 2023-11-16: qty 50

## 2023-11-16 NOTE — MAU Provider Note (Signed)
 History     [id number]  Arrival date and time: 11/16/23 0810    Chief Complaint  Patient presents with   Emesis   Nausea     HPI Amy Downs is a 28 y.o. at [redacted]w[redacted]d by LMP, who presents for hyperemesis gravidarum. This is her fourth visit to the MAU for the same problem. She states that the antiemetics typically work at home but she has been battling severe constipation requiring enemas. She thinks that the constipation is also contributing to her nausea and vomiting. She has a history of marijuana use but has not used since she found out she was pregnant. Denies cramping, vaginal bleeding, vaginal discharge/irritability/itching.   She was also seen on 9/16, 9/17, and 9/22. She had been given over the course of these three visits ondansetron , scopolamine  patch, reglan , phenergan , robinul , and haldol  all to varying degrees of success. She states that the haldol  that she was given last time seemed to work the best and kept her out of the MAU for the longest.    --/--/A POS (04/01 1608)  Past Medical History:  Diagnosis Date   Abscess    Anxiety    no meds   HSV-2 infection    Medical history non-contributory     Past Surgical History:  Procedure Laterality Date   CESAREAN SECTION     twin IUP with PreE   HERNIA REPAIR     WISDOM TOOTH EXTRACTION      Family History  Problem Relation Age of Onset   Heart disease Father     Social History   Socioeconomic History   Marital status: Single    Spouse name: Not on file   Number of children: Not on file   Years of education: Not on file   Highest education level: Not on file  Occupational History   Not on file  Tobacco Use   Smoking status: Never   Smokeless tobacco: Never  Vaping Use   Vaping status: Never Used  Substance and Sexual Activity   Alcohol use: No   Drug use: Not Currently    Types: Marijuana    Comment: quit 4-5 days ago   Sexual activity: Yes    Birth control/protection: None  Other Topics Concern    Not on file  Social History Narrative   Not on file   Social Drivers of Health   Financial Resource Strain: Not on File (07/12/2022)   Received from [employer]    Financial Resource Strain: 0  Food Insecurity: Not at Risk (06/04/2023)   Received from [facility] Insecurity    Within the past 12 months, the food you bought just didn't last and you didn't have enough money to get more.: 1  Transportation Needs: At Risk (06/04/2023)   Received from [facility] Needs    In the past 12 months, has lack of transportation kept you from medical appointments, meetings, work or from getting things needed for daily living? (Check all that apply): 2  Physical Activity: Not on File (07/12/2022)   Received from [facility]   Physical Activity    Physical Activity: 0  Stress: Not on File (07/12/2022)   Received from [facility]   Stress    Stress: 0  Social Connections: Not on File (10/23/2022)   Received from [facility]   Social Connections    Connectedness: 0  Intimate Partner Violence: Not At Risk (11/05/2023)   Humiliation, Afraid, Rape, and Kick questionnaire  Fear of Current or Ex-Partner: No    Emotionally Abused: No    Physically Abused: No    Sexually Abused: No    Allergies  Allergen Reactions   Pollen Extract Itching    No current facility-administered medications on file prior to encounter.   Current Outpatient Medications on File Prior to Encounter  Medication Sig Dispense Refill   Doxylamine -Pyridoxine  10-10 MG TBEC Take 1 tablet by mouth at bedtime for 30 doses. 30 tablet 0   famotidine  (PEPCID ) 20 MG tablet Take 1 tablet (20 mg total) by mouth 2 (two) times daily as needed for heartburn or indigestion. 60 tablet 0   polyethylene glycol powder (GLYCOLAX /MIRALAX ) 17 GM/SCOOP powder Use once daily per package instructions. 850 g 11   potassium chloride  (KLOR-CON ) 10 MEQ tablet Take 1 tablet (10 mEq total) by mouth daily for 10 days. 10 tablet 0     Pertinent positives and negative per HPI, all others reviewed and negative  Physical Exam   BP 134/68 (BP Location: Left Arm)   Ht 5' 9 (1.753 m)   Wt 118.3 kg   LMP 09/22/2023 (Exact Date)   BMI 38.53 kg/m   Patient Vitals for the past 24 hrs:  BP Height Weight  11/16/23 1135 134/68 -- --  11/16/23 0840 -- 5' 9 (1.753 m) 118.3 kg    Physical Exam Vitals and nursing note reviewed.  Constitutional:      Appearance: She is well-developed. She is ill-appearing.  HENT:     Head: Normocephalic and atraumatic.     Mouth/Throat:     Mouth: Mucous membranes are moist.  Eyes:     Extraocular Movements: Extraocular movements intact.  Cardiovascular:     Rate and Rhythm: Normal rate and regular rhythm.  Pulmonary:     Effort: Pulmonary effort is normal.  Abdominal:     Palpations: Abdomen is soft.     Tenderness: There is no abdominal tenderness.  Skin:    Capillary Refill: Capillary refill takes less than 2 seconds.  Neurological:     General: No focal deficit present.     Mental Status: She is alert.     Labs Results for orders placed or performed during the hospital encounter of 11/16/23 (from the past 24 hours)  CBC     Status: None   Collection Time: 11/16/23  8:44 AM  Result Value Ref Range   WBC 6.3 4.0 - 10.5 K/uL   RBC 4.62 3.87 - 5.11 MIL/uL   Hemoglobin 12.8 12.0 - 15.0 g/dL   HCT 61.4 63.9 - 53.9 %   MCV 83.3 80.0 - 100.0 fL   MCH 27.7 26.0 - 34.0 pg   MCHC 33.2 30.0 - 36.0 g/dL   RDW 85.5 88.4 - 84.4 %   Platelets 302 150 - 400 K/uL   nRBC 0.0 0.0 - 0.2 %  Comprehensive metabolic panel     Status: Abnormal   Collection Time: 11/16/23  8:44 AM  Result Value Ref Range   Sodium 133 (L) 135 - 145 mmol/L   Potassium 3.4 (L) 3.5 - 5.1 mmol/L   Chloride 103 98 - 111 mmol/L   CO2 18 (L) 22 - 32 mmol/L   Glucose, Bld 92 70 - 99 mg/dL   BUN 6 6 - 20 mg/dL   Creatinine, Ser 9.19 0.44 - 1.00 mg/dL   Calcium 9.0 8.9 - 89.6 mg/dL   Total Protein 7.3 6.5  - 8.1 g/dL   Albumin 3.4 (L) 3.5 - 5.0  g/dL   AST 22 15 - 41 U/L   ALT 25 0 - 44 U/L   Alkaline Phosphatase 36 (L) 38 - 126 U/L   Total Bilirubin 0.8 0.0 - 1.2 mg/dL   GFR, Estimated >39 >39 mL/min   Anion gap 12 5 - 15  Urinalysis, Routine w reflex microscopic -Urine, Clean Catch     Status: Abnormal   Collection Time: 11/16/23  8:44 AM  Result Value Ref Range   Color, Urine AMBER (A) YELLOW   APPearance HAZY (A) CLEAR   Specific Gravity, Urine 1.029 1.005 - 1.030   pH 5.0 5.0 - 8.0   Glucose, UA NEGATIVE NEGATIVE mg/dL   Hgb urine dipstick NEGATIVE NEGATIVE   Bilirubin Urine SMALL (A) NEGATIVE   Ketones, ur 80 (A) NEGATIVE mg/dL   Protein, ur 899 (A) NEGATIVE mg/dL   Nitrite NEGATIVE NEGATIVE   Leukocytes,Ua MODERATE (A) NEGATIVE   RBC / HPF 0-5 0 - 5 RBC/hpf   WBC, UA 0-5 0 - 5 WBC/hpf   Bacteria, UA RARE (A) NONE SEEN   Squamous Epithelial / HPF 11-20 0 - 5 /HPF   Mucus PRESENT    Hyaline Casts, UA PRESENT     Imaging No results found.  MAU Course  Procedures Lab Orders         CBC         Comprehensive metabolic panel         Urinalysis, Routine w reflex microscopic -Urine, Clean Catch    Meds ordered this encounter  Medications   ondansetron  (ZOFRAN ) injection 4 mg   famotidine  (PEPCID ) IVPB 20 mg premix   lactated ringers  bolus 1,000 mL   lactated ringers  bolus 1,000 mL   ondansetron  (ZOFRAN -ODT) 4 MG disintegrating tablet    Sig: Take 1 tablet (4 mg total) by mouth every 8 (eight) hours as needed for up to 120 doses for nausea or vomiting.    Dispense:  30 tablet    Refill:  3   metoCLOPramide  (REGLAN ) 10 MG tablet    Sig: Take 1 tablet (10 mg total) by mouth every 6 (six) hours.    Dispense:  30 tablet    Refill:  5   Imaging Orders  No imaging studies ordered today    MDM Moderate (Level 3-4)  Assessment and Plan  #Nausea and vomiting of pregnancy #[redacted] weeks gestation of pregnancy  Ishita Nemes is a 28 y.o. at [redacted]w[redacted]d by LMP, who presents for  hyperemesis gravidarum.   -Initially given 4mg  of zofran  and 1L LR bolus with good effect. After medication and fluid she stated that she was able to tolerate PO intake but she still felt nauseous due to her severe constipation. Gave second 1L LR bolus and soap suds enema administered x2 with complete resolution of symptoms. -U/A not concerning for infectious etiology.  -CBC unremarkable -CMP shows mild hyponatremia and hypokalemia but otherwise stable  -Stable to discharge home  -Discussed aggressive bowel regimen with continued use of zofran  for anti-emetic. Patient voices understanding.  -Rx sent for zofran  and reglan  as that is what works for her at home. She thinks she had refills on both but just to make sure. Discussed medication side effects in detail and to use only as needed. -Hyperemesis education provided. Discussed potassium supplementation.  -All questions answered, anticipatory guidance and detailed return precautions provided.  Pihu Basil L Koa Palla, MD/MHA 11/16/23 12:42 PM  Allergies as of 11/16/2023       Reactions   Pollen Extract  Itching        Medication List     STOP taking these medications    glycopyrrolate  2 MG tablet Commonly known as: ROBINUL    potassium chloride  SA 20 MEQ tablet Commonly known as: KLOR-CON  M   promethazine  25 MG tablet Commonly known as: PHENERGAN    scopolamine  1 MG/3DAYS Commonly known as: TRANSDERM-SCOP       TAKE these medications    Doxylamine -Pyridoxine  10-10 MG Tbec Take 1 tablet by mouth at bedtime for 30 doses.   famotidine  20 MG tablet Commonly known as: Pepcid  Take 1 tablet (20 mg total) by mouth 2 (two) times daily as needed for heartburn or indigestion.   metoCLOPramide  10 MG tablet Commonly known as: REGLAN  Take 1 tablet (10 mg total) by mouth every 6 (six) hours.   ondansetron  4 MG disintegrating tablet Commonly known as: ZOFRAN -ODT Take 1 tablet (4 mg total) by mouth every 8 (eight) hours as needed for  up to 120 doses for nausea or vomiting.   polyethylene glycol powder 17 GM/SCOOP powder Commonly known as: GLYCOLAX /MIRALAX  Use once daily per package instructions.   potassium chloride  10 MEQ tablet Commonly known as: KLOR-CON  Take 1 tablet (10 mEq total) by mouth daily for 10 days.

## 2023-11-16 NOTE — MAU Note (Signed)
 Amy Downs is a 28 y.o. at [redacted]w[redacted]d here in MAU reporting: ongoing N/V for 3-4 days. Unable to keep any fluids or solids down. States she is supposed to have outpatient IV infusions but they have not called her for an appointment yet. Lower left abdominal pain. Denies any VB.  Denies any marijuana use, reports not smoking since she found out she was pregnant. Pt actively vomiting in triage.  Onset of complaint: ongoing Pain score: 9 FHT:n/a Lab orders placed from triage:  UA

## 2023-11-18 ENCOUNTER — Encounter (HOSPITAL_COMMUNITY): Payer: Self-pay | Admitting: Obstetrics and Gynecology

## 2023-11-18 ENCOUNTER — Inpatient Hospital Stay (HOSPITAL_COMMUNITY)
Admission: AD | Admit: 2023-11-18 | Discharge: 2023-11-18 | Disposition: A | Discharge status: Home / Self Care | Payer: Self-pay | Attending: Obstetrics and Gynecology | Admitting: Obstetrics and Gynecology

## 2023-11-18 DIAGNOSIS — Z79899 Other long term (current) drug therapy: Secondary | ICD-10-CM | POA: Insufficient documentation

## 2023-11-18 DIAGNOSIS — K5900 Constipation, unspecified: Secondary | ICD-10-CM

## 2023-11-18 DIAGNOSIS — Z3A08 8 weeks gestation of pregnancy: Secondary | ICD-10-CM | POA: Diagnosis not present

## 2023-11-18 DIAGNOSIS — O99611 Diseases of the digestive system complicating pregnancy, first trimester: Secondary | ICD-10-CM | POA: Diagnosis not present

## 2023-11-18 DIAGNOSIS — O210 Mild hyperemesis gravidarum: Secondary | ICD-10-CM | POA: Diagnosis present

## 2023-11-18 DIAGNOSIS — O34219 Maternal care for unspecified type scar from previous cesarean delivery: Secondary | ICD-10-CM | POA: Insufficient documentation

## 2023-11-18 HISTORY — DX: Urinary tract infection, site not specified: N39.0

## 2023-11-18 LAB — URINALYSIS, ROUTINE W REFLEX MICROSCOPIC
Bilirubin Urine: NEGATIVE
Glucose, UA: NEGATIVE mg/dL
Hgb urine dipstick: NEGATIVE
Ketones, ur: 5 mg/dL — AB
Nitrite: NEGATIVE
Protein, ur: NEGATIVE mg/dL
Specific Gravity, Urine: 1.021 (ref 1.005–1.030)
pH: 7 (ref 5.0–8.0)

## 2023-11-18 LAB — COMPREHENSIVE METABOLIC PANEL WITH GFR
ALT: 28 U/L (ref 0–44)
AST: 21 U/L (ref 15–41)
Albumin: 3.3 g/dL — ABNORMAL LOW (ref 3.5–5.0)
Alkaline Phosphatase: 38 U/L (ref 38–126)
Anion gap: 9 (ref 5–15)
BUN: <5 mg/dL — ABNORMAL LOW (ref 6–20)
CO2: 20 mmol/L — ABNORMAL LOW (ref 22–32)
Calcium: 9.1 mg/dL (ref 8.9–10.3)
Chloride: 104 mmol/L (ref 98–111)
Creatinine, Ser: 0.76 mg/dL (ref 0.44–1.00)
GFR, Estimated: >60 mL/min (ref >60)
Glucose, Bld: 85 mg/dL (ref 70–99)
Potassium: 3.5 mmol/L (ref 3.5–5.1)
Sodium: 133 mmol/L — ABNORMAL LOW (ref 135–145)
Total Bilirubin: 0.5 mg/dL (ref 0.0–1.2)
Total Protein: 7.2 g/dL (ref 6.5–8.1)

## 2023-11-18 LAB — CBC
HCT: 38.3 % (ref 36.0–46.0)
Hemoglobin: 12.7 g/dL (ref 12.0–15.0)
MCH: 27.8 pg (ref 26.0–34.0)
MCHC: 33.2 g/dL (ref 30.0–36.0)
MCV: 83.8 fL (ref 80.0–100.0)
Platelets: 286 K/uL (ref 150–400)
RBC: 4.57 MIL/uL (ref 3.87–5.11)
RDW: 14.5 % (ref 11.5–15.5)
WBC: 5.8 K/uL (ref 4.0–10.5)
nRBC: 0 % (ref 0.0–0.2)

## 2023-11-18 MED ORDER — LACTATED RINGERS IV BOLUS
1000.0000 mL | Freq: Once | INTRAVENOUS | Status: AC
  Administered 2023-11-18: 1000 mL via INTRAVENOUS

## 2023-11-18 MED ORDER — METOCLOPRAMIDE HCL 5 MG/ML IJ SOLN
10.0000 mg | Freq: Once | INTRAMUSCULAR | Status: AC
  Administered 2023-11-18: 10 mg via INTRAVENOUS
  Filled 2023-11-18: qty 2

## 2023-11-18 NOTE — MAU Provider Note (Signed)
 Chief Complaint:  Emesis   HPI    Amy Downs is a 28 y.o. G3P1103 at [redacted]w[redacted]d who presents to maternity admissions reporting nausea and vomiting and inability to keep p.o. food or fluids down.  Patient has been seen numerous times for similar complaints including 9/15, 9/16, 9/17, 9/22, 9/28 and presents again today with the same complaints..  She has been prescribed medication for her hyperemesis which include Zofran , scopolamine  patch, Reglan , Phenergan , Robinul  and Haldol  with varying degrees of success.  Patient stated that that Haldol  was the most helpful.  Patient does have a history of marijuana use but reports she has not used since she became pregnant.  Patient also reports a history of constipation although she is taking stool softeners and Mira LAX.  She also has orders placed for infusion therapy and IV fluid hydration.  Awaiting to hear from from the infusion center with her appointments  Pregnancy Course: [facility]   Past Medical History:  Diagnosis Date   Abscess    Anxiety    no meds   HSV-2 infection    UTI (urinary tract infection)    OB History  Gravida Para Term Preterm AB Living  3 2 1 1  3   SAB IAB Ectopic Multiple Live Births     1 3    # Outcome Date GA Lbr Len/2nd Weight Sex Type Anes PTL Lv  3 Current           2A Preterm  [redacted]w[redacted]d    CS-LTranv   LIV  2B Preterm      CS-LTranv   LIV  1 Term  [redacted]w[redacted]d    VBAC   LIV   Past Surgical History:  Procedure Laterality Date   CESAREAN SECTION     twin IUP with PreE   HERNIA REPAIR     WISDOM TOOTH EXTRACTION     Family History  Problem Relation Age of Onset   Stroke Mother 75   Heart disease Father    Social History   Tobacco Use   Smoking status: Never   Smokeless tobacco: Never  Vaping Use   Vaping status: Never Used  Substance Use Topics   Alcohol use: No   Drug use: Not Currently    Types: Marijuana    Comment: wk she found was last   Allergies  Allergen  Reactions   Pollen Extract Itching   Medications Prior to Admission  Medication Sig Dispense Refill Last Dose/Taking   famotidine  (PEPCID ) 20 MG tablet Take 1 tablet (20 mg total) by mouth 2 (two) times daily as needed for heartburn or indigestion. 60 tablet 0 11/17/2023   metoCLOPramide  (REGLAN ) 10 MG tablet Take 1 tablet (10 mg total) by mouth every 6 (six) hours. 30 tablet 5 11/17/2023   polyethylene glycol powder (GLYCOLAX /MIRALAX ) 17 GM/SCOOP powder Use once daily per package instructions. 850 g 11 11/17/2023   promethazine  (PHENERGAN ) 25 MG tablet Take 25 mg by mouth every 6 (six) hours as needed for nausea or vomiting.   11/17/2023 Evening   Doxylamine -Pyridoxine  10-10 MG TBEC Take 1 tablet by mouth at bedtime for 30 doses. 30 tablet 0    ondansetron  (ZOFRAN -ODT) 4 MG disintegrating tablet Take 1 tablet (4 mg total) by mouth every 8 (eight) hours as needed for up to 120 doses for nausea or vomiting. 30 tablet 3    potassium chloride  (KLOR-CON ) 10 MEQ tablet Take 1 tablet (10 mEq total) by mouth daily for 10  days. 10 tablet 0     I have reviewed patient's Past Medical Hx, Surgical Hx, Family Hx, Social Hx, medications and allergies.   ROS  Pertinent items noted in HPI and remainder of comprehensive ROS otherwise negative.   PHYSICAL EXAM  Patient Vitals for the past 24 hrs:  BP Temp Temp src Pulse Resp SpO2 Height Weight  11/18/23 0932 (!) 113/47 -- -- 72 -- -- -- --  11/18/23 0853 126/87 98.9 F (37.2 C) Oral 87 18 100 % 5' 9 (1.753 m) 119.5 kg     No weight Loss noted same since 11/10/23  Constitutional: Well-developed, well-nourished female in no acute distress.  Cardiovascular: normal rate & rhythm, warm and well-perfused Respiratory: normal effort, no problems with respiration noted GI: Abd soft, non-tender, gravid MS: Extremities nontender, no edema, normal ROM Neurologic: Alert and oriented x 4.  GU: no CVA tenderness Pelvic: Deferred        Labs: Results for orders  placed or performed during the hospital encounter of 11/18/23 (from the past 24 hours)  Urinalysis, Routine w reflex microscopic -Urine, Clean Catch     Status: Abnormal   Collection Time: 11/18/23  9:20 AM  Result Value Ref Range   Color, Urine YELLOW YELLOW   APPearance HAZY (A) CLEAR   Specific Gravity, Urine 1.021 1.005 - 1.030   pH 7.0 5.0 - 8.0   Glucose, UA NEGATIVE NEGATIVE mg/dL   Hgb urine dipstick NEGATIVE NEGATIVE   Bilirubin Urine NEGATIVE NEGATIVE   Ketones, ur 5 (A) NEGATIVE mg/dL   Protein, ur NEGATIVE NEGATIVE mg/dL   Nitrite NEGATIVE NEGATIVE   Leukocytes,Ua TRACE (A) NEGATIVE   RBC / HPF 0-5 0 - 5 RBC/hpf   WBC, UA 0-5 0 - 5 WBC/hpf   Bacteria, UA RARE (A) NONE SEEN   Squamous Epithelial / HPF 0-5 0 - 5 /HPF   Mucus PRESENT   CBC     Status: None   Collection Time: 11/18/23  9:57 AM  Result Value Ref Range   WBC 5.8 4.0 - 10.5 K/uL   RBC 4.57 3.87 - 5.11 MIL/uL   Hemoglobin 12.7 12.0 - 15.0 g/dL   HCT 61.6 63.9 - 53.9 %   MCV 83.8 80.0 - 100.0 fL   MCH 27.8 26.0 - 34.0 pg   MCHC 33.2 30.0 - 36.0 g/dL   RDW 85.4 88.4 - 84.4 %   Platelets 286 150 - 400 K/uL   nRBC 0.0 0.0 - 0.2 %  Comprehensive metabolic panel     Status: Abnormal   Collection Time: 11/18/23  9:57 AM  Result Value Ref Range   Sodium 133 (L) 135 - 145 mmol/L   Potassium 3.5 3.5 - 5.1 mmol/L   Chloride 104 98 - 111 mmol/L   CO2 20 (L) 22 - 32 mmol/L   Glucose, Bld 85 70 - 99 mg/dL   BUN <5 (L) 6 - 20 mg/dL   Creatinine, Ser 9.23 0.44 - 1.00 mg/dL   Calcium 9.1 8.9 - 89.6 mg/dL   Total Protein 7.2 6.5 - 8.1 g/dL   Albumin 3.3 (L) 3.5 - 5.0 g/dL   AST 21 15 - 41 U/L   ALT 28 0 - 44 U/L   Alkaline Phosphatase 38 38 - 126 U/L   Total Bilirubin 0.5 0.0 - 1.2 mg/dL   GFR, Estimated >39 >39 mL/min   Anion gap 9 5 - 15    Imaging:  No results found.  MDM & MAU COURSE  MDM:  HIGH  N/V in pregnancy  CBC: NM CMP: No evidence of electrolyte imbalance  UA: No evidence of  dehydration IVF: LR Bolus  Antiemetics ordered  PO Challenge tolerated   Patient reassessed at 1058 reports relief noted from N/V  Will plan for discharge home with OB follow up and message sent to the infusion center for them to schedule her outpatient treatments  Differential diagnosis with nausea/vomiting includes but is not limited to: nausea due to pregnancy, hyperemesis gravidarum, viral infection, gastroenteritis, cholecystitis, pancreatitis, ACS, food poisoning, diabetic ketoacidosis, drug ingestion, gastroparesis, alcohol/opiate withdrawal   MAU Course: Orders Placed This Encounter  Procedures   Urinalysis, Routine w reflex microscopic -Urine, Clean Catch   CBC   Comprehensive metabolic panel   Discharge patient Discharge disposition: 01-Home or Self Care; Discharge patient date: 11/18/2023   Meds ordered this encounter  Medications   metoCLOPramide  (REGLAN ) injection 10 mg   lactated ringers  bolus 1,000 mL    I have reviewed the patient chart and performed the physical exam . I have ordered & interpreted the lab results and reviewed and interpreted the results with the patient  Medications ordered as stated above/below.  A/P as described below.  Counseling and education provided and patient agreeable  with plan as described below. Verbalized understanding.    ASSESSMENT   1. Hyperemesis arising during pregnancy   2. [redacted] weeks gestation of pregnancy   3. Constipation, unspecified constipation type     PLAN  Discharge home in stable condition with return precautions.   See AVS for full description of information given to the patient including both verbal and written. Patient verbalized understanding and agrees with the plan as described above.     Follow-up Information     [facility] for [facility] Follow up.   Specialty: Obstetrics and Gynecology Why: If symptoms worsen or fail to resolve, As scheduled for ongoing prenatal  care Contact information: [address] [place]  [id number] [phone]                Allergies as of 11/18/2023       Reactions   Pollen Extract Itching        Medication List     STOP taking these medications    Doxylamine -Pyridoxine  10-10 MG Tbec       TAKE these medications    famotidine  20 MG tablet Commonly known as: Pepcid  Take 1 tablet (20 mg total) by mouth 2 (two) times daily as needed for heartburn or indigestion.   metoCLOPramide  10 MG tablet Commonly known as: REGLAN  Take 1 tablet (10 mg total) by mouth every 6 (six) hours.   ondansetron  4 MG disintegrating tablet Commonly known as: ZOFRAN -ODT Take 1 tablet (4 mg total) by mouth every 8 (eight) hours as needed for up to 120 doses for nausea or vomiting.   polyethylene glycol powder 17 GM/SCOOP powder Commonly known as: GLYCOLAX /MIRALAX  Use once daily per package instructions.   potassium chloride  10 MEQ tablet Commonly known as: KLOR-CON  Take 1 tablet (10 mEq total) by mouth daily for 10 days.   promethazine  25 MG tablet Commonly known as: PHENERGAN  Take 25 mg by mouth every 6 (six) hours as needed for nausea or vomiting.        Olam Dalton, MSN, [facility], Center for [facility]    This chart was dictated using voice recognition software, Dragon. Despite the best efforts of this provider to proofread  and correct errors, errors may still occur which can change documentation meaning.

## 2023-11-18 NOTE — MAU Note (Signed)
 Amy Downs is a 28 y.o. at [redacted]w[redacted]d here in MAU reporting: (audible retching heard from desk).  You all just need to keep me,  I am trying.  States yesterday was a good day, was able to drink a lot of water, continued to take meds.  Started feeling nauseated later in the day.  Took her promethazine , tried to wake up to take other med, but couldn't is out of the suppositories. No pain or bleeding.  Is constipated, taking the stool softener and miralax .  Doesn't think she is dehydrated, but is heading there.  'this is my last one, can't do this again. Onset of complaint: ongoing Pain score: none Vitals:   11/18/23 0853  BP: 126/87  Pulse: 87  Resp: 18  Temp: 98.9 F (37.2 C)  SpO2: 100%     Lab orders placed from triage:  urine    Has yet to be called by the infusion center.

## 2023-11-20 ENCOUNTER — Encounter (HOSPITAL_COMMUNITY)
Admission: RE | Admit: 2023-11-20 | Discharge: 2023-11-20 | Disposition: A | Discharge status: Home / Self Care | Source: Ambulatory Visit

## 2023-11-20 VITALS — BP 113/83 | HR 81 | Temp 98.2°F | Resp 18

## 2023-11-20 DIAGNOSIS — Z8759 Personal history of other complications of pregnancy, childbirth and the puerperium: Secondary | ICD-10-CM | POA: Insufficient documentation

## 2023-11-20 DIAGNOSIS — E860 Dehydration: Secondary | ICD-10-CM | POA: Diagnosis present

## 2023-11-20 MED ORDER — LACTATED RINGERS IV BOLUS
1000.0000 mL | Freq: Once | INTRAVENOUS | Status: AC
  Administered 2023-11-20: 1000 mL via INTRAVENOUS

## 2023-11-20 MED ORDER — METOCLOPRAMIDE HCL 5 MG/ML IJ SOLN
10.0000 mg | Freq: Once | INTRAMUSCULAR | Status: AC
  Administered 2023-11-20: 10 mg via INTRAVENOUS
  Filled 2023-11-20 (×2): qty 2

## 2023-11-25 ENCOUNTER — Other Ambulatory Visit (HOSPITAL_COMMUNITY): Payer: Self-pay

## 2023-11-25 ENCOUNTER — Ambulatory Visit (HOSPITAL_COMMUNITY)
Admission: RE | Admit: 2023-11-25 | Discharge: 2023-11-25 | Disposition: A | Discharge status: Home / Self Care | Source: Ambulatory Visit

## 2023-11-25 VITALS — BP 111/71 | HR 81 | Temp 98.4°F | Resp 16

## 2023-11-25 DIAGNOSIS — O210 Mild hyperemesis gravidarum: Secondary | ICD-10-CM | POA: Insufficient documentation

## 2023-11-25 DIAGNOSIS — Z3A09 9 weeks gestation of pregnancy: Secondary | ICD-10-CM | POA: Insufficient documentation

## 2023-11-25 DIAGNOSIS — E860 Dehydration: Secondary | ICD-10-CM | POA: Insufficient documentation

## 2023-11-25 DIAGNOSIS — Z8759 Personal history of other complications of pregnancy, childbirth and the puerperium: Secondary | ICD-10-CM | POA: Diagnosis present

## 2023-11-25 MED ORDER — METOCLOPRAMIDE HCL 5 MG/ML IJ SOLN
10.0000 mg | Freq: Once | INTRAMUSCULAR | Status: AC
  Administered 2023-11-25: 10 mg via INTRAVENOUS
  Filled 2023-11-25: qty 2

## 2023-11-25 MED ORDER — LACTATED RINGERS IV BOLUS
1000.0000 mL | Freq: Once | INTRAVENOUS | Status: AC
  Administered 2023-11-25: 1000 mL via INTRAVENOUS

## 2023-11-25 MED ORDER — SODIUM CHLORIDE 0.9 % IV SOLN: Freq: Once | INTRAVENOUS | Status: DC | PRN

## 2023-11-25 MED ORDER — FAMOTIDINE IN NACL 20-0.9 MG/50ML-% IV SOLN: 20.0000 mg | Freq: Once | INTRAVENOUS | Status: DC | PRN

## 2023-11-27 ENCOUNTER — Other Ambulatory Visit: Payer: Self-pay

## 2023-11-27 ENCOUNTER — Encounter (HOSPITAL_COMMUNITY): Payer: Self-pay | Admitting: Obstetrics and Gynecology

## 2023-11-27 ENCOUNTER — Inpatient Hospital Stay (HOSPITAL_COMMUNITY)
Admission: AD | Admit: 2023-11-27 | Discharge: 2023-11-27 | Disposition: A | Discharge status: Home / Self Care | Payer: Self-pay | Attending: Obstetrics and Gynecology | Admitting: Obstetrics and Gynecology

## 2023-11-27 ENCOUNTER — Other Ambulatory Visit: Payer: Self-pay | Admitting: Nurse Practitioner

## 2023-11-27 DIAGNOSIS — O210 Mild hyperemesis gravidarum: Secondary | ICD-10-CM | POA: Diagnosis not present

## 2023-11-27 DIAGNOSIS — O99281 Endocrine, nutritional and metabolic diseases complicating pregnancy, first trimester: Secondary | ICD-10-CM

## 2023-11-27 DIAGNOSIS — E860 Dehydration: Secondary | ICD-10-CM | POA: Diagnosis not present

## 2023-11-27 DIAGNOSIS — O26891 Other specified pregnancy related conditions, first trimester: Secondary | ICD-10-CM | POA: Insufficient documentation

## 2023-11-27 DIAGNOSIS — O211 Hyperemesis gravidarum with metabolic disturbance: Secondary | ICD-10-CM | POA: Diagnosis present

## 2023-11-27 DIAGNOSIS — O99280 Endocrine, nutritional and metabolic diseases complicating pregnancy, unspecified trimester: Secondary | ICD-10-CM

## 2023-11-27 DIAGNOSIS — Z3A09 9 weeks gestation of pregnancy: Secondary | ICD-10-CM

## 2023-11-27 DIAGNOSIS — Z79899 Other long term (current) drug therapy: Secondary | ICD-10-CM | POA: Diagnosis not present

## 2023-11-27 DIAGNOSIS — E876 Hypokalemia: Secondary | ICD-10-CM | POA: Diagnosis not present

## 2023-11-27 LAB — URINALYSIS, ROUTINE W REFLEX MICROSCOPIC
Bilirubin Urine: NEGATIVE
Glucose, UA: NEGATIVE mg/dL
Hgb urine dipstick: NEGATIVE
Ketones, ur: 20 mg/dL — AB
Leukocytes,Ua: NEGATIVE
Nitrite: NEGATIVE
Protein, ur: 100 mg/dL — AB
Specific Gravity, Urine: 1.026 (ref 1.005–1.030)
pH: 7 (ref 5.0–8.0)

## 2023-11-27 LAB — COMPREHENSIVE METABOLIC PANEL WITH GFR
ALT: 19 U/L (ref 0–44)
AST: 19 U/L (ref 15–41)
Albumin: 3 g/dL — ABNORMAL LOW (ref 3.5–5.0)
Alkaline Phosphatase: 38 U/L (ref 38–126)
Anion gap: 12 (ref 5–15)
BUN: <5 mg/dL — ABNORMAL LOW (ref 6–20)
CO2: 20 mmol/L — ABNORMAL LOW (ref 22–32)
Calcium: 8.9 mg/dL (ref 8.9–10.3)
Chloride: 103 mmol/L (ref 98–111)
Creatinine, Ser: 0.73 mg/dL (ref 0.44–1.00)
GFR, Estimated: >60 mL/min (ref >60)
Glucose, Bld: 91 mg/dL (ref 70–99)
Potassium: 3.4 mmol/L — ABNORMAL LOW (ref 3.5–5.1)
Sodium: 135 mmol/L (ref 135–145)
Total Bilirubin: 0.5 mg/dL (ref 0.0–1.2)
Total Protein: 6.9 g/dL (ref 6.5–8.1)

## 2023-11-27 LAB — CBC
HCT: 35 % — ABNORMAL LOW (ref 36.0–46.0)
Hemoglobin: 11.7 g/dL — ABNORMAL LOW (ref 12.0–15.0)
MCH: 27.9 pg (ref 26.0–34.0)
MCHC: 33.4 g/dL (ref 30.0–36.0)
MCV: 83.5 fL (ref 80.0–100.0)
Platelets: 273 K/uL (ref 150–400)
RBC: 4.19 MIL/uL (ref 3.87–5.11)
RDW: 14 % (ref 11.5–15.5)
WBC: 4.3 K/uL (ref 4.0–10.5)
nRBC: 0 % (ref 0.0–0.2)

## 2023-11-27 LAB — MAGNESIUM: Magnesium: 1.7 mg/dL (ref 1.7–2.4)

## 2023-11-27 MED ORDER — LACTATED RINGERS IV BOLUS
1000.0000 mL | Freq: Once | INTRAVENOUS | Status: AC
  Administered 2023-11-27: 1000 mL via INTRAVENOUS

## 2023-11-27 MED ORDER — FAMOTIDINE IN NACL 20-0.9 MG/50ML-% IV SOLN
20.0000 mg | Freq: Once | INTRAVENOUS | Status: AC
  Administered 2023-11-27: 20 mg via INTRAVENOUS

## 2023-11-27 MED ORDER — METOCLOPRAMIDE HCL 5 MG/ML IJ SOLN
10.0000 mg | Freq: Once | INTRAMUSCULAR | Status: AC
  Administered 2023-11-27: 10 mg via INTRAVENOUS
  Filled 2023-11-27: qty 2

## 2023-11-27 MED ORDER — HALOPERIDOL LACTATE 5 MG/ML IJ SOLN
2.0000 mg | Freq: Once | INTRAMUSCULAR | Status: AC
  Administered 2023-11-27: 2 mg via INTRAVENOUS
  Filled 2023-11-27: qty 1

## 2023-11-27 MED ORDER — GLYCOPYRROLATE 0.2 MG/ML IJ SOLN
0.2000 mg | Freq: Once | INTRAMUSCULAR | Status: AC
  Administered 2023-11-27: 0.2 mg via INTRAVENOUS
  Filled 2023-11-27: qty 1

## 2023-11-27 MED ORDER — SCOPOLAMINE 1 MG/3DAYS TD PT72: 1.0000 | MEDICATED_PATCH | TRANSDERMAL | 0 refills | Status: DC

## 2023-11-27 MED ORDER — GLYCOPYRROLATE 2 MG PO TABS: 2.0000 mg | ORAL_TABLET | Freq: Three times a day (TID) | ORAL | 3 refills | Status: DC | PRN

## 2023-11-27 MED ORDER — POTASSIUM CHLORIDE ER 10 MEQ PO TBCR: 10.0000 meq | EXTENDED_RELEASE_TABLET | Freq: Every day | ORAL | 0 refills | Status: DC

## 2023-11-27 NOTE — Discharge Instructions (Signed)
 Follow up as scheduled with Infusion Center for IVF Hydration and continue with home antiemetics given for Nausea and vomiting

## 2023-11-27 NOTE — MAU Note (Signed)
 Amy Downs is a 28 y.o. at [redacted]w[redacted]d here in MAU reporting: she's having N/V has noted blood in vomit and feels like she's going to pass out.  States she's vomited over 15x in last 24 hours.  LMP: 09/22/2023 Onset of complaint: last night Pain score: 0 Vitals:   11/27/23 0940  BP: (!) 154/81  Pulse: 98  Resp: 20  Temp: 98.3 F (36.8 C)  SpO2: 100%     FHT: NA  Lab orders placed from triage: UA

## 2023-11-27 NOTE — MAU Provider Note (Cosign Needed Addendum)
 Chief Complaint:  Nausea and Emesis   HPI    Amy Downs is a 28 y.o. G3P1103 at [redacted]w[redacted]d who presents to maternity admissions reporting she is continuing to have nausea and vomiting with inability to keep p.o. food or fluids down.  Patient has been seen numerous times for similar complaints including 9/15, 9/16, 9/17, 9/22, 9/28 &  9/30 and has received medication for hyperemesis  which include Zofran , scopolamine  patch, Reglan , Phenergan , Robinul , Haldol  with varying degrees of success.  Patient has reported that Haldol  helped her the most although she denies any recent use of marijuana and states that she had stopped when she found out she was pregnant.    She also has appointments at the IV infusion center for fluid hydration and has been receiving Phenergan  with her IV fluids which she reports is not helpful and would like the Phenergan  discontinued and changed to Reglan  which she feels is more effective. Her last IV infusion appointment was on 11/25/23  Pregnancy Course: [facility]   Past Medical History:  Diagnosis Date   Abscess    Anxiety    no meds   HSV-2 infection    UTI (urinary tract infection)    OB History  Gravida Para Term Preterm AB Living  3 2 1 1  3   SAB IAB Ectopic Multiple Live Births     1 3    # Outcome Date GA Lbr Len/2nd Weight Sex Type Anes PTL Lv  3 Current           2A Preterm  [redacted]w[redacted]d    CS-LTranv   LIV  2B Preterm      CS-LTranv   LIV  1 Term  [redacted]w[redacted]d    VBAC   LIV   Past Surgical History:  Procedure Laterality Date   CESAREAN SECTION     twin IUP with PreE   HERNIA REPAIR     WISDOM TOOTH EXTRACTION     Family History  Problem Relation Age of Onset   Stroke Mother 72   Heart disease Father    Social History   Tobacco Use   Smoking status: Never   Smokeless tobacco: Never  Vaping Use   Vaping status: Never Used  Substance Use Topics   Alcohol use: No   Drug use: Not Currently    Types: Marijuana     Comment: wk she found was last   Allergies  Allergen Reactions   Pollen Extract Itching   Medications Prior to Admission  Medication Sig Dispense Refill Last Dose/Taking   metoCLOPramide  (REGLAN ) 10 MG tablet Take 1 tablet (10 mg total) by mouth every 6 (six) hours. 30 tablet 5 11/27/2023 Morning   ondansetron  (ZOFRAN -ODT) 4 MG disintegrating tablet Take 1 tablet (4 mg total) by mouth every 8 (eight) hours as needed for up to 120 doses for nausea or vomiting. 30 tablet 3 11/27/2023 Morning   famotidine  (PEPCID ) 20 MG tablet Take 1 tablet (20 mg total) by mouth 2 (two) times daily as needed for heartburn or indigestion. 60 tablet 0    polyethylene glycol powder (GLYCOLAX /MIRALAX ) 17 GM/SCOOP powder Use once daily per package instructions. 850 g 11    promethazine  (PHENERGAN ) 25 MG tablet Take 25 mg by mouth every 6 (six) hours as needed for nausea or vomiting.      [DISCONTINUED] potassium chloride  (KLOR-CON ) 10 MEQ tablet Take 1 tablet (10 mEq total) by mouth daily for 10 days. 10 tablet 0  I have reviewed patient's Past Medical Hx, Surgical Hx, Family Hx, Social Hx, medications and allergies.   ROS  Pertinent items noted in HPI and remainder of comprehensive ROS otherwise negative.   PHYSICAL EXAM  Patient Vitals for the past 24 hrs:  BP Temp Temp src Pulse Resp SpO2 Height Weight  11/27/23 1254 127/80 -- -- 65 -- -- -- --  11/27/23 0940 (!) 154/81 98.3 F (36.8 C) Oral 98 20 100 % -- --  11/27/23 0936 -- -- -- -- -- -- 5' 9 (1.753 m) 117.2 kg    Constitutional: Well-developed, obese  female who appears ill Cardiovascular: normal rate & rhythm, warm and well-perfused Respiratory: normal effort, no problems with respiration noted GI: Abd soft, non-tender MS: Extremities nontender, no edema, normal ROM Neurologic: Alert and oriented x 4.      Labs: Results for orders placed or performed during the hospital encounter of 11/27/23 (from the past 24 hours)  Urinalysis, Routine w  reflex microscopic -Urine, Clean Catch     Status: Abnormal   Collection Time: 11/27/23  9:53 AM  Result Value Ref Range   Color, Urine AMBER (A) YELLOW   APPearance HAZY (A) CLEAR   Specific Gravity, Urine 1.026 1.005 - 1.030   pH 7.0 5.0 - 8.0   Glucose, UA NEGATIVE NEGATIVE mg/dL   Hgb urine dipstick NEGATIVE NEGATIVE   Bilirubin Urine NEGATIVE NEGATIVE   Ketones, ur 20 (A) NEGATIVE mg/dL   Protein, ur 899 (A) NEGATIVE mg/dL   Nitrite NEGATIVE NEGATIVE   Leukocytes,Ua NEGATIVE NEGATIVE   RBC / HPF 0-5 0 - 5 RBC/hpf   WBC, UA 0-5 0 - 5 WBC/hpf   Bacteria, UA RARE (A) NONE SEEN   Squamous Epithelial / HPF 0-5 0 - 5 /HPF   Mucus PRESENT   CBC     Status: Abnormal   Collection Time: 11/27/23 10:58 AM  Result Value Ref Range   WBC 4.3 4.0 - 10.5 K/uL   RBC 4.19 3.87 - 5.11 MIL/uL   Hemoglobin 11.7 (L) 12.0 - 15.0 g/dL   HCT 64.9 (L) 63.9 - 53.9 %   MCV 83.5 80.0 - 100.0 fL   MCH 27.9 26.0 - 34.0 pg   MCHC 33.4 30.0 - 36.0 g/dL   RDW 85.9 88.4 - 84.4 %   Platelets 273 150 - 400 K/uL   nRBC 0.0 0.0 - 0.2 %  Comprehensive metabolic panel     Status: Abnormal   Collection Time: 11/27/23 10:58 AM  Result Value Ref Range   Sodium 135 135 - 145 mmol/L   Potassium 3.4 (L) 3.5 - 5.1 mmol/L   Chloride 103 98 - 111 mmol/L   CO2 20 (L) 22 - 32 mmol/L   Glucose, Bld 91 70 - 99 mg/dL   BUN <5 (L) 6 - 20 mg/dL   Creatinine, Ser 9.26 0.44 - 1.00 mg/dL   Calcium 8.9 8.9 - 89.6 mg/dL   Total Protein 6.9 6.5 - 8.1 g/dL   Albumin 3.0 (L) 3.5 - 5.0 g/dL   AST 19 15 - 41 U/L   ALT 19 0 - 44 U/L   Alkaline Phosphatase 38 38 - 126 U/L   Total Bilirubin 0.5 0.0 - 1.2 mg/dL   GFR, Estimated >39 >39 mL/min   Anion gap 12 5 - 15  Magnesium     Status: None   Collection Time: 11/27/23 10:58 AM  Result Value Ref Range   Magnesium 1.7 1.7 - 2.4 mg/dL  Imaging:  No results found.  MDM & MAU COURSE  MDM:  HIGH   N/V in pregnancy  CBC: NM CMP: Potassium at 3.4 ( Will dc home on  supplements) Serum magnesium : 1.7  ( Normal) UA: Ketones and proteinuria present c/w dehydration IVF Bolus Antiemetics ordered ( Reglan , Robinul , Pepcid , and patient also agreed to Haldol  -since it's helped her previously) PO Challenge tolerated and patient reports feeling better   Initial BP elevated due to patient retching and vomiting at the time it was taken. Her BP at discharge was 127/80 and has previously been normotensive   Differential diagnosis with nausea/vomiting includes but is not limited to: nausea due to pregnancy, hyperemesis gravidarum, viral infection, gastroenteritis, cholecystitis, pancreatitis, ACS, food poisoning, diabetic ketoacidosis, drug ingestion, gastroparesis, alcohol/opiate withdrawal   MAU Course: Orders Placed This Encounter  Procedures   Urinalysis, Routine w reflex microscopic -Urine, Clean Catch   CBC   Comprehensive metabolic panel   Magnesium   Discharge patient Discharge disposition: 01-Home or Self Care; Discharge patient date: 11/27/2023   Meds ordered this encounter  Medications   metoCLOPramide  (REGLAN ) injection 10 mg   famotidine  (PEPCID ) IVPB 20 mg premix   lactated ringers  bolus 1,000 mL   glycopyrrolate  (ROBINUL ) injection 0.2 mg   haloperidol  lactate (HALDOL ) injection 2 mg   potassium chloride  (KLOR-CON ) 10 MEQ tablet    Sig: Take 1 tablet (10 mEq total) by mouth daily for 10 days.    Dispense:  10 tablet    Refill:  0    Supervising Provider:   PRATT, TANYA S [[id number]]   glycopyrrolate  (ROBINUL ) 2 MG tablet    Sig: Take 1 tablet (2 mg total) by mouth 3 (three) times daily as needed.    Dispense:  30 tablet    Refill:  3    Supervising Provider:   PRATT, TANYA S [[id number]]   scopolamine  (TRANSDERM-SCOP) 1 MG/3DAYS    Sig: Place 1 patch (1 mg total) onto the skin every 3 (three) days.    Dispense:  10 patch    Refill:  0    Supervising Provider:   PRATT, TANYA S [[id number]]     I have reviewed the patient chart and performed the  physical exam . I have ordered & interpreted the lab results and reviewed them with patient  Medications ordered as stated above/ below.  A/P as described below.  Counseling and education provided and patient agreeable  with plan as described below. Verbalized understanding.    ASSESSMENT   1. Hyperemesis arising during pregnancy   2. Dehydration during pregnancy   3. Hypokalemia   4. [redacted] weeks gestation of pregnancy     PLAN  Discharge home in stable condition with return precautions.   Follow up at the infusion center as scheduled ( Upon review of meds at infusion center patient is receiving Reglan  and not Phenergan  ) Therefore no changes needed at this time to her current treatment plan  See AVS for full description of information given to the patient including both verbal and written. Patient verbalized understanding and agrees with the plan as described above.     Follow-up Information     [facility] Follow up.   Specialty: Obstetrics and Gynecology Why: If symptoms worsen or fail to resolve, As scheduled for ongoing prenatal care Contact information: [address] [place]  [address] [phone]  Allergies as of 11/27/2023       Reactions   Pollen Extract Itching        Medication List     STOP taking these medications    promethazine  25 MG tablet Commonly known as: PHENERGAN        TAKE these medications    famotidine  20 MG tablet Commonly known as: Pepcid  Take 1 tablet (20 mg total) by mouth 2 (two) times daily as needed for heartburn or indigestion.   glycopyrrolate  2 MG tablet Commonly known as: ROBINUL  Take 1 tablet (2 mg total) by mouth 3 (three) times daily as needed.   metoCLOPramide  10 MG tablet Commonly known as: REGLAN  Take 1 tablet (10 mg total) by mouth every 6 (six) hours.   ondansetron  4 MG disintegrating tablet Commonly known as:  ZOFRAN -ODT Take 1 tablet (4 mg total) by mouth every 8 (eight) hours as needed for up to 120 doses for nausea or vomiting.   polyethylene glycol powder 17 GM/SCOOP powder Commonly known as: GLYCOLAX /MIRALAX  Use once daily per package instructions.   potassium chloride  10 MEQ tablet Commonly known as: KLOR-CON  Take 1 tablet (10 mEq total) by mouth daily for 10 days.   scopolamine  1 MG/3DAYS Commonly known as: TRANSDERM-SCOP Place 1 patch (1 mg total) onto the skin every 3 (three) days.        Olam Dalton, MSN, [facility], Center for [facility]    This chart was dictated using voice recognition software, Dragon. Despite the best efforts of this provider to proofread and correct errors, errors may still occur which can change documentation meaning.

## 2023-11-28 ENCOUNTER — Ambulatory Visit (HOSPITAL_COMMUNITY)
Admission: RE | Admit: 2023-11-28 | Discharge: 2023-11-28 | Disposition: A | Discharge status: Home / Self Care | Source: Ambulatory Visit

## 2023-11-28 VITALS — BP 103/89 | HR 67 | Temp 98.2°F | Resp 16

## 2023-11-28 DIAGNOSIS — Z8759 Personal history of other complications of pregnancy, childbirth and the puerperium: Secondary | ICD-10-CM

## 2023-11-28 DIAGNOSIS — O210 Mild hyperemesis gravidarum: Secondary | ICD-10-CM | POA: Diagnosis not present

## 2023-11-28 MED ORDER — METOCLOPRAMIDE HCL 5 MG/ML IJ SOLN
INTRAMUSCULAR | Status: AC
  Filled 2023-11-28: qty 2

## 2023-11-28 MED ORDER — METOCLOPRAMIDE HCL 5 MG/ML IJ SOLN
10.0000 mg | Freq: Once | INTRAMUSCULAR | Status: AC
  Administered 2023-11-28: 10 mg via INTRAVENOUS

## 2023-11-28 MED ORDER — LACTATED RINGERS IV BOLUS
1000.0000 mL | Freq: Once | INTRAVENOUS | Status: AC
  Administered 2023-11-28: 1000 mL via INTRAVENOUS

## 2023-11-29 ENCOUNTER — Observation Stay (HOSPITAL_COMMUNITY)
Admission: AD | Admit: 2023-11-29 | Discharge: 2023-11-30 | Disposition: A | Discharge status: Home / Self Care | Payer: Self-pay | Attending: Obstetrics & Gynecology | Admitting: Obstetrics & Gynecology

## 2023-11-29 ENCOUNTER — Encounter (HOSPITAL_COMMUNITY): Payer: Self-pay | Admitting: Obstetrics & Gynecology

## 2023-11-29 ENCOUNTER — Inpatient Hospital Stay (HOSPITAL_COMMUNITY)

## 2023-11-29 ENCOUNTER — Other Ambulatory Visit: Payer: Self-pay

## 2023-11-29 DIAGNOSIS — Z79899 Other long term (current) drug therapy: Secondary | ICD-10-CM | POA: Diagnosis not present

## 2023-11-29 DIAGNOSIS — Z23 Encounter for immunization: Secondary | ICD-10-CM | POA: Insufficient documentation

## 2023-11-29 DIAGNOSIS — O211 Hyperemesis gravidarum with metabolic disturbance: Principal | ICD-10-CM

## 2023-11-29 DIAGNOSIS — O210 Mild hyperemesis gravidarum: Principal | ICD-10-CM | POA: Diagnosis present

## 2023-11-29 DIAGNOSIS — Z3A09 9 weeks gestation of pregnancy: Secondary | ICD-10-CM | POA: Insufficient documentation

## 2023-11-29 DIAGNOSIS — O26851 Spotting complicating pregnancy, first trimester: Secondary | ICD-10-CM | POA: Diagnosis not present

## 2023-11-29 DIAGNOSIS — O208 Other hemorrhage in early pregnancy: Secondary | ICD-10-CM | POA: Diagnosis not present

## 2023-11-29 DIAGNOSIS — K922 Gastrointestinal hemorrhage, unspecified: Secondary | ICD-10-CM

## 2023-11-29 LAB — CBC WITH DIFFERENTIAL/PLATELET
Abs Immature Granulocytes: 0.03 K/uL (ref 0.00–0.07)
Basophils Absolute: 0 K/uL (ref 0.0–0.1)
Basophils Relative: 0 %
Eosinophils Absolute: 0 K/uL (ref 0.0–0.5)
Eosinophils Relative: 0 %
HCT: 37.4 % (ref 36.0–46.0)
Hemoglobin: 12.6 g/dL (ref 12.0–15.0)
Immature Granulocytes: 1 %
Lymphocytes Relative: 22 %
Lymphs Abs: 1.2 K/uL (ref 0.7–4.0)
MCH: 28 pg (ref 26.0–34.0)
MCHC: 33.7 g/dL (ref 30.0–36.0)
MCV: 83.1 fL (ref 80.0–100.0)
Monocytes Absolute: 0.4 K/uL (ref 0.1–1.0)
Monocytes Relative: 8 %
Neutro Abs: 3.7 K/uL (ref 1.7–7.7)
Neutrophils Relative %: 69 %
Platelets: 302 K/uL (ref 150–400)
RBC: 4.5 MIL/uL (ref 3.87–5.11)
RDW: 13.5 % (ref 11.5–15.5)
WBC: 5.4 K/uL (ref 4.0–10.5)
nRBC: 0 % (ref 0.0–0.2)

## 2023-11-29 LAB — COMPREHENSIVE METABOLIC PANEL WITH GFR
ALT: 17 U/L (ref 0–44)
AST: 18 U/L (ref 15–41)
Albumin: 3.3 g/dL — ABNORMAL LOW (ref 3.5–5.0)
Alkaline Phosphatase: 41 U/L (ref 38–126)
Anion gap: 14 (ref 5–15)
BUN: <5 mg/dL — ABNORMAL LOW (ref 6–20)
CO2: 18 mmol/L — ABNORMAL LOW (ref 22–32)
Calcium: 9.2 mg/dL (ref 8.9–10.3)
Chloride: 102 mmol/L (ref 98–111)
Creatinine, Ser: 0.77 mg/dL (ref 0.44–1.00)
GFR, Estimated: >60 mL/min (ref >60)
Glucose, Bld: 100 mg/dL — ABNORMAL HIGH (ref 70–99)
Potassium: 3.2 mmol/L — ABNORMAL LOW (ref 3.5–5.1)
Sodium: 134 mmol/L — ABNORMAL LOW (ref 135–145)
Total Bilirubin: 0.6 mg/dL (ref 0.0–1.2)
Total Protein: 7.4 g/dL (ref 6.5–8.1)

## 2023-11-29 LAB — RAPID URINE DRUG SCREEN, HOSP PERFORMED
Amphetamines: NOT DETECTED
Barbiturates: NOT DETECTED
Benzodiazepines: NOT DETECTED
Cocaine: NOT DETECTED
Opiates: POSITIVE — AB
Tetrahydrocannabinol: POSITIVE — AB

## 2023-11-29 LAB — URINALYSIS, ROUTINE W REFLEX MICROSCOPIC
Bilirubin Urine: NEGATIVE
Glucose, UA: NEGATIVE mg/dL
Ketones, ur: 20 mg/dL — AB
Leukocytes,Ua: NEGATIVE
Nitrite: NEGATIVE
Protein, ur: 30 mg/dL — AB
Specific Gravity, Urine: 1.004 — ABNORMAL LOW (ref 1.005–1.030)
pH: 7 (ref 5.0–8.0)

## 2023-11-29 LAB — LIPASE, BLOOD: Lipase: 20 U/L (ref 11–51)

## 2023-11-29 LAB — MAGNESIUM: Magnesium: 1.5 mg/dL — ABNORMAL LOW (ref 1.7–2.4)

## 2023-11-29 LAB — PREPARE RBC (CROSSMATCH)

## 2023-11-29 MED ORDER — METHYLPREDNISOLONE SODIUM SUCC 125 MG IJ SOLR
48.0000 mg | Freq: Once | INTRAMUSCULAR | Status: AC
  Administered 2023-11-29: 48 mg via INTRAVENOUS
  Filled 2023-11-29: qty 2

## 2023-11-29 MED ORDER — POTASSIUM CHLORIDE 10 MEQ/100ML IV SOLN
10.0000 meq | INTRAVENOUS | Status: AC
  Administered 2023-11-29 (×4): 10 meq via INTRAVENOUS
  Filled 2023-11-29 (×5): qty 100

## 2023-11-29 MED ORDER — METHYLPREDNISOLONE 16 MG PO TABS
16.0000 mg | ORAL_TABLET | Freq: Every day | ORAL | Status: DC
  Filled 2023-11-29: qty 1

## 2023-11-29 MED ORDER — MAGNESIUM SULFATE 2 GM/50ML IV SOLN
2.0000 g | Freq: Once | INTRAVENOUS | Status: AC
  Administered 2023-11-29: 2 g via INTRAVENOUS
  Filled 2023-11-29: qty 50

## 2023-11-29 MED ORDER — ZOLPIDEM TARTRATE 5 MG PO TABS: 5.0000 mg | ORAL_TABLET | Freq: Every evening | ORAL | Status: DC | PRN

## 2023-11-29 MED ORDER — ACETAMINOPHEN 325 MG PO TABS: 650.0000 mg | ORAL_TABLET | ORAL | Status: DC | PRN

## 2023-11-29 MED ORDER — METOCLOPRAMIDE HCL 5 MG/ML IJ SOLN
10.0000 mg | Freq: Four times a day (QID) | INTRAMUSCULAR | Status: DC | PRN
  Administered 2023-11-29 – 2023-11-30 (×3): 10 mg via INTRAVENOUS
  Filled 2023-11-29 (×3): qty 2

## 2023-11-29 MED ORDER — DOCUSATE SODIUM 100 MG PO CAPS
100.0000 mg | ORAL_CAPSULE | Freq: Every day | ORAL | Status: DC
  Administered 2023-11-30: 100 mg via ORAL
  Filled 2023-11-29: qty 1

## 2023-11-29 MED ORDER — ONDANSETRON HCL 4 MG/2ML IJ SOLN
4.0000 mg | Freq: Four times a day (QID) | INTRAMUSCULAR | Status: DC | PRN
  Administered 2023-11-29 – 2023-11-30 (×2): 4 mg via INTRAVENOUS
  Filled 2023-11-29 (×2): qty 2

## 2023-11-29 MED ORDER — INFLUENZA VIRUS VACC SPLIT PF (FLUZONE) 0.5 ML IM SUSY
0.5000 mL | PREFILLED_SYRINGE | INTRAMUSCULAR | Status: AC
  Administered 2023-11-30: 0.5 mL via INTRAMUSCULAR
  Filled 2023-11-29: qty 0.5

## 2023-11-29 MED ORDER — LACTATED RINGERS IV BOLUS
1000.0000 mL | Freq: Once | INTRAVENOUS | Status: AC
  Administered 2023-11-29: 1000 mL via INTRAVENOUS

## 2023-11-29 MED ORDER — METHYLPREDNISOLONE 4 MG PO TABS: 8.0000 mg | ORAL_TABLET | Freq: Every day | ORAL | Status: DC

## 2023-11-29 MED ORDER — METHYLPREDNISOLONE 4 MG PO TABS: 4.0000 mg | ORAL_TABLET | Freq: Every day | ORAL | Status: DC

## 2023-11-29 MED ORDER — LACTATED RINGERS IV SOLN: 125.0000 mL/h | INTRAVENOUS | Status: DC

## 2023-11-29 MED ORDER — PRENATAL MULTIVITAMIN CH: 1.0000 | ORAL_TABLET | Freq: Every day | ORAL | Status: DC

## 2023-11-29 MED ORDER — METHYLPREDNISOLONE 16 MG PO TABS
16.0000 mg | ORAL_TABLET | Freq: Every day | ORAL | Status: DC
  Administered 2023-11-30: 16 mg via ORAL
  Filled 2023-11-29: qty 1

## 2023-11-29 MED ORDER — MORPHINE SULFATE (PF) 4 MG/ML IV SOLN
2.0000 mg | INTRAVENOUS | Status: DC | PRN
  Administered 2023-11-29: 2 mg via INTRAVENOUS
  Filled 2023-11-29: qty 1

## 2023-11-29 MED ORDER — ACETAMINOPHEN 10 MG/ML IV SOLN
1000.0000 mg | Freq: Once | INTRAVENOUS | Status: AC
  Administered 2023-11-29: 1000 mg via INTRAVENOUS
  Filled 2023-11-29: qty 100

## 2023-11-29 MED ORDER — PANTOPRAZOLE SODIUM 40 MG IV SOLR
40.0000 mg | Freq: Once | INTRAVENOUS | Status: AC
  Administered 2023-11-29: 40 mg via INTRAVENOUS
  Filled 2023-11-29: qty 10

## 2023-11-29 MED ORDER — HALOPERIDOL LACTATE 5 MG/ML IJ SOLN
1.0000 mg | Freq: Once | INTRAMUSCULAR | Status: AC
  Administered 2023-11-29: 1 mg via INTRAVENOUS
  Filled 2023-11-29: qty 1

## 2023-11-29 MED ORDER — POTASSIUM CHLORIDE 10 MEQ/100ML IV SOLN
10.0000 meq | Freq: Once | INTRAVENOUS | Status: DC
  Filled 2023-11-29: qty 100

## 2023-11-29 MED ORDER — DIPHENHYDRAMINE HCL 50 MG/ML IJ SOLN
25.0000 mg | Freq: Once | INTRAMUSCULAR | Status: AC
  Administered 2023-11-29: 25 mg via INTRAVENOUS
  Filled 2023-11-29: qty 1

## 2023-11-29 MED ORDER — CALCIUM CARBONATE ANTACID 500 MG PO CHEW: 2.0000 | CHEWABLE_TABLET | ORAL | Status: DC | PRN

## 2023-11-29 NOTE — MAU Note (Signed)
 Maternal Assessment Unit Provider Note  Subjective: Amy Downs is a 28 y.o. [phone] pregnant female at [redacted]w[redacted]d who presents to MAU today with complaint of vomiting blood.   Patient has been seen in the MAU not 10/9, 9/30, 9/28, 9/22, 9/17, 9/16, and in the ER on 9/11, 9/12, 9/15, and 9/26 with complaints of nausea and vomiting of pregnancy.  She has a history of hyperemesis gravidarum with her prior pregnancies.  She has also been attending appointments at the IV infusion center for IV fluids and Phenergan  which was then switched to Reglan  as the Phenergan  was not helping.  She presents with vomiting which started this morning at 5 to 6 AM.  She reports taking Zofran , Reglan , Robinul , famotidine , Diclegis , and currently has a scopolamine  patch on.  She vomited these up this morning. Last p.o. intake was a couple bites of food last night.  She reports vomiting bright red blood.  Currently has brown-colored bleeding in her emesis bag.  She also notes mild pelvic cramping.  8 out of 10 burning stomach pain.  Denies vaginal bleeding.  Previously IV Haldol  helped some, though she denies marijuana use since she found out she was pregnant and since her last visit to the MAU 2 days ago.  Pertinent items noted in HPI and remainder of comprehensive ROS otherwise negative.   Objective: BP (!) 112/48   Pulse 91   Temp 98 F (36.7 C)   Resp (!) 22   LMP 09/22/2023 (Exact Date)   SpO2 100%  Physical Exam Vitals reviewed.  Constitutional:      General: She is in acute distress.     Appearance: She is well-developed. She is ill-appearing. She is not diaphoretic.  HENT:     Head: Normocephalic and atraumatic.  Eyes:     General: No scleral icterus. Cardiovascular:     Rate and Rhythm: Normal rate and regular rhythm.  Pulmonary:     Effort: Pulmonary effort is normal. No respiratory distress.  Abdominal:     General: There is no distension.     Palpations: Abdomen is soft.     Tenderness:  There is no abdominal tenderness. There is no guarding or rebound.  Skin:    General: Skin is warm and dry.  Neurological:     Mental Status: She is alert.     Coordination: Coordination normal.    Results for orders placed or performed during the hospital encounter of 11/29/23 (from the past 24 hours)  CBC with Differential/Platelet     Status: None   Collection Time: 11/29/23  3:29 PM  Result Value Ref Range   WBC 5.4 4.0 - 10.5 K/uL   RBC 4.50 3.87 - 5.11 MIL/uL   Hemoglobin 12.6 12.0 - 15.0 g/dL   HCT 62.5 63.9 - 53.9 %   MCV 83.1 80.0 - 100.0 fL   MCH 28.0 26.0 - 34.0 pg   MCHC 33.7 30.0 - 36.0 g/dL   RDW 86.4 88.4 - 84.4 %   Platelets 302 150 - 400 K/uL   nRBC 0.0 0.0 - 0.2 %   Neutrophils Relative % 69 %   Neutro Abs 3.7 1.7 - 7.7 K/uL   Lymphocytes Relative 22 %   Lymphs Abs 1.2 0.7 - 4.0 K/uL   Monocytes Relative 8 %   Monocytes Absolute 0.4 0.1 - 1.0 K/uL   Eosinophils Relative 0 %   Eosinophils Absolute 0.0 0.0 - 0.5 K/uL   Basophils Relative 0 %   Basophils Absolute  0.0 0.0 - 0.1 K/uL   Immature Granulocytes 1 %   Abs Immature Granulocytes 0.03 0.00 - 0.07 K/uL  Comprehensive metabolic panel     Status: Abnormal   Collection Time: 11/29/23  3:29 PM  Result Value Ref Range   Sodium 134 (L) 135 - 145 mmol/L   Potassium 3.2 (L) 3.5 - 5.1 mmol/L   Chloride 102 98 - 111 mmol/L   CO2 18 (L) 22 - 32 mmol/L   Glucose, Bld 100 (H) 70 - 99 mg/dL   BUN <5 (L) 6 - 20 mg/dL   Creatinine, Ser 9.22 0.44 - 1.00 mg/dL   Calcium 9.2 8.9 - 89.6 mg/dL   Total Protein 7.4 6.5 - 8.1 g/dL   Albumin 3.3 (L) 3.5 - 5.0 g/dL   AST 18 15 - 41 U/L   ALT 17 0 - 44 U/L   Alkaline Phosphatase 41 38 - 126 U/L   Total Bilirubin 0.6 0.0 - 1.2 mg/dL   GFR, Estimated >39 >39 mL/min   Anion gap 14 5 - 15  Magnesium     Status: Abnormal   Collection Time: 11/29/23  3:29 PM  Result Value Ref Range   Magnesium 1.5 (L) 1.7 - 2.4 mg/dL  Lipase, blood     Status: None   Collection Time:  11/29/23  3:29 PM  Result Value Ref Range   Lipase 20 11 - 51 U/L  Type and screen [facility]     Status: None (Preliminary result)   Collection Time: 11/29/23  4:55 PM  Result Value Ref Range   ABO/RH(D) PENDING    Antibody Screen PENDING    Sample Expiration      12/02/2023,2359 Performed at [facility], [address], [place], [place] [address]   Prepare RBC (crossmatch)     Status: None   Collection Time: 11/29/23  5:00 PM  Result Value Ref Range   Order Confirmation      ORDER PROCESSED BY BLOOD BANK Performed at [facility], [address], [place], [place] [address]     Allergies  Allergen Reactions   Pollen Extract Itching    Current Meds  Medication Sig   famotidine  (PEPCID ) 20 MG tablet Take 1 tablet (20 mg total) by mouth 2 (two) times daily as needed for heartburn or indigestion.   glycopyrrolate  (ROBINUL ) 2 MG tablet Take 1 tablet (2 mg total) by mouth 3 (three) times daily as needed.   metoCLOPramide  (REGLAN ) 10 MG tablet Take 1 tablet (10 mg total) by mouth every 6 (six) hours.   ondansetron  (ZOFRAN -ODT) 4 MG disintegrating tablet Take 1 tablet (4 mg total) by mouth every 8 (eight) hours as needed for up to 120 doses for nausea or vomiting.   scopolamine  (TRANSDERM-SCOP) 1 MG/3DAYS Place 1 patch (1 mg total) onto the skin every 3 (three) days.      MDM: Moderate risk  MAU Course:  Time: 1520 Assessed patient, appears ill appearing. Actively vomiting. Brown and red tinge in vomitus. IV LR, pantoprazole & CBC/CMP/Magnesium/already ordered. Called to help with a delivery at this time.  Time: 1610 IV Zofran , Haldol , diphenhydramine  (pt has a driver), 2mg  morphine , IV acetaminophen , type and screen ordered. Discussed blood transfusion if indicated with patient and she is agreeable. Currently Hb normal (12.6).   Questions were answered to the satisfaction of the patient and/or family prior to discharge.  Mg (1.5) and K (3.2) low.  Ordered IV replacement.   Time: 1645  Discussed patient with Dr. Eveline. He graciously agreed to accept her for admission.   Time: 1700 Notified by RN pt is now having significant vaginal bleeding.  Piece of 1x1cm clot with appearance of tissue in the toilet Speculum exam: cervix appears slightly dilated 1-2cm, may be due to multiparous, no tissue, scant/mild amount of bleeding in vaginal vault  Will get US  after transfer to Ante unit  Assessment Medical screening exam complete    ICD-10-CM   1. Hyperemesis gravidarum before end of [redacted] week gestation with electrolyte imbalance  O21.1     2. Gastrointestinal hemorrhage, unspecified gastrointestinal hemorrhage type  K92.2       Plan Admit to antenatal unit for HG and GI bleed   Trudy Leeroy NOVAK, MD 11/29/2023 5:39 PM

## 2023-11-29 NOTE — H&P (Signed)
 OBSTETRIC ADMISSION HISTORY AND PHYSICAL  Amy Downs is a 28 y.o. female [phone] with IUP at [redacted]w[redacted]d by LMP admitted for hyperemesis gravidarum, and GI bleed with failed outpatient management.   She has been seen in the MAU 8 times this pregnancy.  She has a history of prior hyperemesis gravidarum.  She reports vomiting started this morning, and despite taking Reglan , Zofran , Robinul , famotidine , Diclegis , and pollinating patch but she vomited everything up.  She has also been set up with outpatient IV fluid and promethazine  switch to Reglan  infusions.  She also reports burning epigastric/abdominal pain, and mild pelvic cramping.    She does have a history of marijuana use, but prior to finding out she was pregnant.  None recently.  She has noted some benefit from IV Haldol  during visits to the MAU.  In the MAU she had vomitus with bright red blood intermixed with brown.  Last p.o. intake was last night, she ate about 2 bites.  In the MAU she was given IV Zofran , Haldol , diphenhydramine , morphine , acetaminophen , 1 L LR.  2 units were typed and screened.  Vomiting improved.  Upon presentation she denied vaginal bleeding, however while in the MAU she started having some significant vaginal bleeding, and passed a large clot which had the appearance of possible tissue in the toilet.  Speculum exam was overall unremarkable.  Ultrasound ordered.  Prenatal History/Complications: History of hyperemesis gravidarum, genital herpes, prior C-section (twin gestation), severe preeclampsia  Past Medical History: Past Medical History:  Diagnosis Date   Abscess    Anxiety    no meds   HSV-2 infection    UTI (urinary tract infection)     Past Surgical History: Past Surgical History:  Procedure Laterality Date   CESAREAN SECTION     twin IUP with PreE   HERNIA REPAIR     WISDOM TOOTH EXTRACTION      Obstetrical History: OB History     Gravida  3   Para  2   Term  1   Preterm  1   AB       Living  3      SAB      IAB      Ectopic      Multiple  1   Live Births  3           Social History Social History   Socioeconomic History   Marital status: Single    Spouse name: Not on file   Number of children: Not on file   Years of education: Not on file   Highest education level: Not on file  Occupational History   Not on file  Tobacco Use   Smoking status: Never   Smokeless tobacco: Never  Vaping Use   Vaping status: Never Used  Substance and Sexual Activity   Alcohol use: No   Drug use: Not Currently    Types: Marijuana    Comment: wk she found was last   Sexual activity: Not Currently    Birth control/protection: None  Other Topics Concern   Not on file  Social History Narrative   Not on file   Social Drivers of Health   Financial Resource Strain: Not on File (07/12/2022)   Received from [employer]    Financial Resource Strain: 0  Food Insecurity: Not at Risk (06/04/2023)   Received from [facility] Insecurity    Within the past 12 months, the food you bought  just didn't last and you didn't have enough money to get more.: 1  Transportation Needs: At Risk (06/04/2023)   Received from [facility] Needs    In the past 12 months, has lack of transportation kept you from medical appointments, meetings, work or from getting things needed for daily living? (Check all that apply): 2  Physical Activity: Not on File (07/12/2022)   Received from [facility]   Physical Activity    Physical Activity: 0  Stress: Not on File (07/12/2022)   Received from [facility]   Stress    Stress: 0  Social Connections: Not on File (10/23/2022)   Received from [facility]   Social Connections    Connectedness: 0    Family History: Family History  Problem Relation Age of Onset   Stroke Mother 40   Heart disease Father     Allergies: Allergies  Allergen Reactions   Pollen Extract Itching    Medications Prior to Admission  Medication Sig  Dispense Refill Last Dose/Taking   famotidine  (PEPCID ) 20 MG tablet Take 1 tablet (20 mg total) by mouth 2 (two) times daily as needed for heartburn or indigestion. 60 tablet 0 11/29/2023   glycopyrrolate  (ROBINUL ) 2 MG tablet Take 1 tablet (2 mg total) by mouth 3 (three) times daily as needed. 30 tablet 3 11/29/2023   metoCLOPramide  (REGLAN ) 10 MG tablet Take 1 tablet (10 mg total) by mouth every 6 (six) hours. 30 tablet 5 11/29/2023   ondansetron  (ZOFRAN -ODT) 4 MG disintegrating tablet Take 1 tablet (4 mg total) by mouth every 8 (eight) hours as needed for up to 120 doses for nausea or vomiting. 30 tablet 3 11/29/2023   scopolamine  (TRANSDERM-SCOP) 1 MG/3DAYS Place 1 patch (1 mg total) onto the skin every 3 (three) days. 10 patch 0 11/29/2023   polyethylene glycol powder (GLYCOLAX /MIRALAX ) 17 GM/SCOOP powder Use once daily per package instructions. 850 g 11    potassium chloride  (KLOR-CON ) 10 MEQ tablet Take 1 tablet (10 mEq total) by mouth daily for 10 days. 10 tablet 0      Review of Systems   All systems reviewed and negative except as stated in HPI  Blood pressure (!) 112/48, pulse 91, temperature 98 F (36.7 C), resp. rate (!) 22, last menstrual period 09/22/2023, SpO2 100%. General appearance: moderate distress and ill-appearing Lungs: clear to auscultation bilaterally Heart: regular rate and rhythm Abdomen: soft, non-tender; bowel sounds normal Pelvic: Cervix mildly dilated 1 to 2 cm without any tissue noted on the os.  Mild/scant bleeding in the vaginal vault Lower extremities: No edema  Results for orders placed or performed during the hospital encounter of 11/29/23 (from the past 24 hours)  CBC with Differential/Platelet     Status: None   Collection Time: 11/29/23  3:29 PM  Result Value Ref Range   WBC 5.4 4.0 - 10.5 K/uL   RBC 4.50 3.87 - 5.11 MIL/uL   Hemoglobin 12.6 12.0 - 15.0 g/dL   HCT 62.5 63.9 - 53.9 %   MCV 83.1 80.0 - 100.0 fL   MCH 28.0 26.0 - 34.0 pg   MCHC  33.7 30.0 - 36.0 g/dL   RDW 86.4 88.4 - 84.4 %   Platelets 302 150 - 400 K/uL   nRBC 0.0 0.0 - 0.2 %   Neutrophils Relative % 69 %   Neutro Abs 3.7 1.7 - 7.7 K/uL   Lymphocytes Relative 22 %   Lymphs Abs 1.2 0.7 - 4.0 K/uL   Monocytes Relative 8 %  Monocytes Absolute 0.4 0.1 - 1.0 K/uL   Eosinophils Relative 0 %   Eosinophils Absolute 0.0 0.0 - 0.5 K/uL   Basophils Relative 0 %   Basophils Absolute 0.0 0.0 - 0.1 K/uL   Immature Granulocytes 1 %   Abs Immature Granulocytes 0.03 0.00 - 0.07 K/uL  Comprehensive metabolic panel     Status: Abnormal   Collection Time: 11/29/23  3:29 PM  Result Value Ref Range   Sodium 134 (L) 135 - 145 mmol/L   Potassium 3.2 (L) 3.5 - 5.1 mmol/L   Chloride 102 98 - 111 mmol/L   CO2 18 (L) 22 - 32 mmol/L   Glucose, Bld 100 (H) 70 - 99 mg/dL   BUN <5 (L) 6 - 20 mg/dL   Creatinine, Ser 9.22 0.44 - 1.00 mg/dL   Calcium 9.2 8.9 - 89.6 mg/dL   Total Protein 7.4 6.5 - 8.1 g/dL   Albumin 3.3 (L) 3.5 - 5.0 g/dL   AST 18 15 - 41 U/L   ALT 17 0 - 44 U/L   Alkaline Phosphatase 41 38 - 126 U/L   Total Bilirubin 0.6 0.0 - 1.2 mg/dL   GFR, Estimated >39 >39 mL/min   Anion gap 14 5 - 15  Magnesium     Status: Abnormal   Collection Time: 11/29/23  3:29 PM  Result Value Ref Range   Magnesium 1.5 (L) 1.7 - 2.4 mg/dL  Lipase, blood     Status: None   Collection Time: 11/29/23  3:29 PM  Result Value Ref Range   Lipase 20 11 - 51 U/L  Type and screen [facility]     Status: None (Preliminary result)   Collection Time: 11/29/23  4:55 PM  Result Value Ref Range   ABO/RH(D) PENDING    Antibody Screen PENDING    Sample Expiration      12/02/2023,2359 Performed at [facility], [address], [place], [place] [address]   Prepare RBC (crossmatch)     Status: None   Collection Time: 11/29/23  5:00 PM  Result Value Ref Range   Order Confirmation      ORDER PROCESSED BY BLOOD BANK Performed at [facility], [address]  [place], [place] [address]     Immunization History  Administered Date(s) Administered   Dtap, Unspecified 04/19/1994, 06/20/1994, 09/04/1994, 10/10/1995, 10/13/1998   Fluad Trivalent(High Dose 65+) 10/26/2013   HIB, Unspecified 04/19/1994, 06/20/1994, 09/04/1994, 10/10/1995   HPV Quadrivalent 03/07/2006, 11/02/2007, 04/18/2008   Hep B, Unspecified 12-12-93, 03/14/1994, 09/04/1994   Hepatitis A, Ped/Adol-2 Dose 01/15/2010, 04/01/2011   Influenza Nasal 01/15/2010, 04/01/2011   Influenza, Seasonal, Injecte, Preservative Fre 03/07/2006, 04/18/2008   MMR 03/25/1995, 10/13/1998   Meningococcal Conjugate 03/07/2006, 09/19/2011   Polio, Unspecified 04/19/1994, 06/20/1994, 09/04/1994, 10/13/1998   Tdap 04/18/2008, 02/28/2014, 06/15/2015   Varicella 03/25/1995, 01/15/2010   Zoster Recombinant(Shingrix) 05/22/2022    Results for orders placed or performed during the hospital encounter of 11/29/23 (from the past 24 hours)  CBC with Differential/Platelet   Collection Time: 11/29/23  3:29 PM  Result Value Ref Range   WBC 5.4 4.0 - 10.5 K/uL   RBC 4.50 3.87 - 5.11 MIL/uL   Hemoglobin 12.6 12.0 - 15.0 g/dL   HCT 62.5 63.9 - 53.9 %   MCV 83.1 80.0 - 100.0 fL   MCH 28.0 26.0 - 34.0 pg   MCHC 33.7 30.0 - 36.0 g/dL   RDW 86.4 88.4 - 84.4 %   Platelets  302 150 - 400 K/uL   nRBC 0.0 0.0 - 0.2 %   Neutrophils Relative % 69 %   Neutro Abs 3.7 1.7 - 7.7 K/uL   Lymphocytes Relative 22 %   Lymphs Abs 1.2 0.7 - 4.0 K/uL   Monocytes Relative 8 %   Monocytes Absolute 0.4 0.1 - 1.0 K/uL   Eosinophils Relative 0 %   Eosinophils Absolute 0.0 0.0 - 0.5 K/uL   Basophils Relative 0 %   Basophils Absolute 0.0 0.0 - 0.1 K/uL   Immature Granulocytes 1 %   Abs Immature Granulocytes 0.03 0.00 - 0.07 K/uL  Comprehensive metabolic panel   Collection Time: 11/29/23  3:29 PM  Result Value Ref Range   Sodium 134 (L) 135 - 145 mmol/L   Potassium 3.2 (L) 3.5 - 5.1 mmol/L   Chloride 102 98 - 111 mmol/L   CO2 18 (L)  22 - 32 mmol/L   Glucose, Bld 100 (H) 70 - 99 mg/dL   BUN <5 (L) 6 - 20 mg/dL   Creatinine, Ser 9.22 0.44 - 1.00 mg/dL   Calcium 9.2 8.9 - 89.6 mg/dL   Total Protein 7.4 6.5 - 8.1 g/dL   Albumin 3.3 (L) 3.5 - 5.0 g/dL   AST 18 15 - 41 U/L   ALT 17 0 - 44 U/L   Alkaline Phosphatase 41 38 - 126 U/L   Total Bilirubin 0.6 0.0 - 1.2 mg/dL   GFR, Estimated >39 >39 mL/min   Anion gap 14 5 - 15  Magnesium   Collection Time: 11/29/23  3:29 PM  Result Value Ref Range   Magnesium 1.5 (L) 1.7 - 2.4 mg/dL  Lipase, blood   Collection Time: 11/29/23  3:29 PM  Result Value Ref Range   Lipase 20 11 - 51 U/L  Type and screen [facility]   Collection Time: 11/29/23  4:55 PM  Result Value Ref Range   ABO/RH(D) PENDING    Antibody Screen PENDING    Sample Expiration      12/02/2023,2359 Performed at [facility], [address], [place], [place] [address]   Prepare RBC (crossmatch)   Collection Time: 11/29/23  5:00 PM  Result Value Ref Range   Order Confirmation      ORDER PROCESSED BY BLOOD BANK Performed at [facility], [address] [place], [place] [address]     Patient Active Problem List   Diagnosis Date Noted   Hyperemesis gravidarum 11/29/2023   History of severe pre-eclampsia 11/10/2023   History of hyperemesis gravidarum 11/10/2023   Genital herpes simplex 07/08/2016   History of C-section 01/04/2015   Decreased fetal movement     Assessment/Plan:  Donique Hammonds is a 28 y.o. G3P1103 at [redacted]w[redacted]d here for hyperemesis gravidarum and GI bleed  #Hyperemesis gravidarum - Steroids, IV fluids, n.p.o., nausea meds, pain medication as indicated  #GI bleed - Status post IV pantoprazole, manage #1.  2 units typed and screened  #Vaginal bleeding - Ultrasound pending  Leeroy KATHEE Pouch, MD  11/29/2023, 5:40 PM

## 2023-11-29 NOTE — MAU Note (Signed)
.  Amy Downs is a 28 y.o. at [redacted]w[redacted]d here in MAU reporting: vomiting started yesterday worsened today  Onset of complaint: yesterday  Pain score: 8/10 with vomiting  There were no vitals filed for this visit.    Lab orders placed from triage:   ua

## 2023-11-30 LAB — COMPREHENSIVE METABOLIC PANEL WITH GFR
ALT: 18 U/L (ref 0–44)
AST: 19 U/L (ref 15–41)
Albumin: 2.7 g/dL — ABNORMAL LOW (ref 3.5–5.0)
Alkaline Phosphatase: 34 U/L — ABNORMAL LOW (ref 38–126)
Anion gap: 7 (ref 5–15)
BUN: <5 mg/dL — ABNORMAL LOW (ref 6–20)
CO2: 20 mmol/L — ABNORMAL LOW (ref 22–32)
Calcium: 8.8 mg/dL — ABNORMAL LOW (ref 8.9–10.3)
Chloride: 106 mmol/L (ref 98–111)
Creatinine, Ser: 0.64 mg/dL (ref 0.44–1.00)
GFR, Estimated: >60 mL/min (ref >60)
Glucose, Bld: 126 mg/dL — ABNORMAL HIGH (ref 70–99)
Potassium: 4.4 mmol/L (ref 3.5–5.1)
Sodium: 133 mmol/L — ABNORMAL LOW (ref 135–145)
Total Bilirubin: 0.4 mg/dL (ref 0.0–1.2)
Total Protein: 6.5 g/dL (ref 6.5–8.1)

## 2023-11-30 LAB — CBC
HCT: 32.3 % — ABNORMAL LOW (ref 36.0–46.0)
Hemoglobin: 10.9 g/dL — ABNORMAL LOW (ref 12.0–15.0)
MCH: 27.9 pg (ref 26.0–34.0)
MCHC: 33.7 g/dL (ref 30.0–36.0)
MCV: 82.8 fL (ref 80.0–100.0)
Platelets: 239 K/uL (ref 150–400)
RBC: 3.9 MIL/uL (ref 3.87–5.11)
RDW: 13.6 % (ref 11.5–15.5)
WBC: 5.1 K/uL (ref 4.0–10.5)
nRBC: 0 % (ref 0.0–0.2)

## 2023-11-30 LAB — MAGNESIUM: Magnesium: 1.9 mg/dL (ref 1.7–2.4)

## 2023-11-30 MED ORDER — METHYLPREDNISOLONE 4 MG PO TABS: ORAL_TABLET | ORAL | 0 refills | Status: DC

## 2023-11-30 MED ORDER — SCOPOLAMINE 1 MG/3DAYS TD PT72
1.0000 | MEDICATED_PATCH | TRANSDERMAL | Status: DC
  Administered 2023-11-30: 1 mg via TRANSDERMAL
  Filled 2023-11-30: qty 1

## 2023-11-30 MED ORDER — LACTATED RINGERS IV SOLN
125.0000 mL/h | INTRAVENOUS | Status: DC
  Administered 2023-11-30: 125 mL/h via INTRAVENOUS

## 2023-11-30 NOTE — Discharge Summary (Signed)
 Physician Discharge Summary  Patient ID: Amy Downs MRN: [id number] DOB/AGE: 30/28/1997 29 y.o.  Admit date: 11/29/2023 Discharge date: 11/30/2023  Admission Diagnoses:Principal Problem:   Hyperemesis gravidarum  Discharge Diagnoses:  Principal Problem:   Hyperemesis gravidarum   Discharged Condition: good  Hospital Course: Amy Downs is a 28 y.o. female [phone] with IUP at [redacted]w[redacted]d by LMP admitted for hyperemesis gravidarum, and GI bleed with failed outpatient management.    She has been seen in the MAU 8 times this pregnancy.  She has a history of prior hyperemesis gravidarum.  She reports vomiting started this morning, and despite taking Reglan , Zofran , Robinul , famotidine , Diclegis , and pollinating patch but she vomited everything up.  She has also been set up with outpatient IV fluid and promethazine  switch to Reglan  infusions.  She also reports burning epigastric/abdominal pain, and mild pelvic cramping.     She does have a history of marijuana use, but prior to finding out she was pregnant.  None recently.  She has noted some benefit from IV Haldol  during visits to the MAU.   In the MAU she had vomitus with bright red blood intermixed with brown.  Last p.o. intake was last night, she ate about 2 bites.   In the MAU she was given IV Zofran , Haldol , diphenhydramine , morphine , acetaminophen , 1 L LR.   Vomiting improved.   Upon presentation she denied vaginal bleeding, however while in the MAU she started having some significant vaginal bleeding, and passed a large clot which had the appearance of possible tissue in the toilet.  Speculum exam was overall unremarkable.  Ultrasound ordered.  US  showed SCH with viable IUP She received steroids and antiemetics and fluids and her nausea improved and she felt well, Consults: None  Significant Diagnostic Studies: radiology: Ultrasound: for f/u VB  Treatments: IV hydration and steroids: solu-medrol   Discharge Exam: Blood pressure  119/69, pulse 72, temperature 97.9 F (36.6 C), temperature source Oral, resp. rate 17, last menstrual period 09/22/2023, SpO2 99%. General appearance: alert, cooperative, and no distress  Disposition: Discharge disposition: 01-Home or Self Care       Discharge Instructions     Discharge patient   Complete by: As directed    Discharge disposition: 01-Home or Self Care   Discharge patient date: 11/30/2023      Allergies as of 11/30/2023       Reactions   Pollen Extract Itching        Medication List     STOP taking these medications    potassium chloride  10 MEQ tablet Commonly known as: KLOR-CON    scopolamine  1 MG/3DAYS Commonly known as: TRANSDERM-SCOP       TAKE these medications    famotidine  20 MG tablet Commonly known as: Pepcid  Take 1 tablet (20 mg total) by mouth 2 (two) times daily as needed for heartburn or indigestion.   glycopyrrolate  2 MG tablet Commonly known as: ROBINUL  Take 1 tablet (2 mg total) by mouth 3 (three) times daily as needed.   methylPREDNISolone  4 MG tablet Commonly known as: MEDROL  Take 4 tablets (16 mg total) by mouth at bedtime for 3 days, THEN 2 tablets (8 mg total) at bedtime for 2 days, THEN 1 tablet (4 mg total) at bedtime for 2 days. Start taking on: November 30, 2023   metoCLOPramide  10 MG tablet Commonly known as: REGLAN  Take 1 tablet (10 mg total) by mouth every 6 (six) hours.   ondansetron  4 MG disintegrating tablet Commonly known as: ZOFRAN -ODT Take 1 tablet (4  mg total) by mouth every 8 (eight) hours as needed for up to 120 doses for nausea or vomiting.   polyethylene glycol powder 17 GM/SCOOP powder Commonly known as: GLYCOLAX /MIRALAX  Use once daily per package instructions.        Follow-up Information     [facility] Follow up in 4 day(s).   Specialty: Obstetrics and Gynecology Why: virtual Contact information: [address] [place]  [place] [address] [phone]                Signed: Lynwood Solomons 11/30/2023, 9:52 AM

## 2023-12-01 ENCOUNTER — Ambulatory Visit (HOSPITAL_COMMUNITY)
Admission: RE | Admit: 2023-12-01 | Discharge: 2023-12-01 | Disposition: A | Discharge status: Home / Self Care | Source: Ambulatory Visit

## 2023-12-01 VITALS — BP 137/96 | HR 70 | Temp 98.0°F | Resp 16

## 2023-12-01 DIAGNOSIS — Z8759 Personal history of other complications of pregnancy, childbirth and the puerperium: Secondary | ICD-10-CM | POA: Diagnosis not present

## 2023-12-01 MED ORDER — METOCLOPRAMIDE HCL 5 MG/ML IJ SOLN
10.0000 mg | Freq: Once | INTRAMUSCULAR | Status: AC
  Administered 2023-12-01: 10 mg via INTRAVENOUS

## 2023-12-01 MED ORDER — METOCLOPRAMIDE HCL 5 MG/ML IJ SOLN
INTRAMUSCULAR | Status: AC
  Filled 2023-12-01: qty 2

## 2023-12-01 MED ORDER — LACTATED RINGERS IV BOLUS
1000.0000 mL | Freq: Once | INTRAVENOUS | Status: AC
  Administered 2023-12-01: 1000 mL via INTRAVENOUS

## 2023-12-03 ENCOUNTER — Ambulatory Visit (HOSPITAL_COMMUNITY)
Admission: RE | Admit: 2023-12-03 | Discharge: 2023-12-03 | Disposition: A | Discharge status: Home / Self Care | Source: Ambulatory Visit

## 2023-12-03 VITALS — BP 111/69 | HR 84 | Temp 98.4°F | Resp 16

## 2023-12-03 DIAGNOSIS — Z8759 Personal history of other complications of pregnancy, childbirth and the puerperium: Secondary | ICD-10-CM

## 2023-12-03 LAB — BPAM RBC
Blood Product Expiration Date: 202511052359
Blood Product Expiration Date: 202511052359
Unit Type and Rh: 6200
Unit Type and Rh: 6200

## 2023-12-03 LAB — TYPE AND SCREEN
ABO/RH(D): A POS
Antibody Screen: POSITIVE
Unit division: 0
Unit division: 0

## 2023-12-03 MED ORDER — LACTATED RINGERS IV BOLUS
1000.0000 mL | Freq: Once | INTRAVENOUS | Status: AC
  Administered 2023-12-03: 1000 mL via INTRAVENOUS

## 2023-12-03 MED ORDER — METOCLOPRAMIDE HCL 5 MG/ML IJ SOLN
10.0000 mg | Freq: Once | INTRAMUSCULAR | Status: AC
  Administered 2023-12-03: 10 mg via INTRAVENOUS

## 2023-12-03 MED ORDER — METOCLOPRAMIDE HCL 5 MG/ML IJ SOLN
INTRAMUSCULAR | Status: AC
  Filled 2023-12-03: qty 2

## 2023-12-04 ENCOUNTER — Other Ambulatory Visit: Payer: Self-pay

## 2023-12-04 ENCOUNTER — Telehealth

## 2023-12-04 ENCOUNTER — Observation Stay (HOSPITAL_COMMUNITY)
Admission: AD | Admit: 2023-12-04 | Discharge: 2023-12-05 | Disposition: A | Discharge status: Home / Self Care | Attending: Obstetrics and Gynecology | Admitting: Obstetrics and Gynecology

## 2023-12-04 ENCOUNTER — Encounter (HOSPITAL_COMMUNITY): Payer: Self-pay | Admitting: Obstetrics and Gynecology

## 2023-12-04 ENCOUNTER — Telehealth: Payer: Self-pay

## 2023-12-04 DIAGNOSIS — O99891 Other specified diseases and conditions complicating pregnancy: Secondary | ICD-10-CM | POA: Insufficient documentation

## 2023-12-04 DIAGNOSIS — Z3A10 10 weeks gestation of pregnancy: Secondary | ICD-10-CM | POA: Diagnosis not present

## 2023-12-04 DIAGNOSIS — E876 Hypokalemia: Secondary | ICD-10-CM | POA: Insufficient documentation

## 2023-12-04 DIAGNOSIS — O210 Mild hyperemesis gravidarum: Principal | ICD-10-CM | POA: Diagnosis present

## 2023-12-04 LAB — COMPREHENSIVE METABOLIC PANEL WITH GFR
ALT: 23 U/L (ref 0–44)
AST: 18 U/L (ref 15–41)
Albumin: 3.2 g/dL — ABNORMAL LOW (ref 3.5–5.0)
Alkaline Phosphatase: 38 U/L (ref 38–126)
Anion gap: 12 (ref 5–15)
BUN: 5 mg/dL — ABNORMAL LOW (ref 6–20)
CO2: 18 mmol/L — ABNORMAL LOW (ref 22–32)
Calcium: 9.2 mg/dL (ref 8.9–10.3)
Chloride: 104 mmol/L (ref 98–111)
Creatinine, Ser: 0.71 mg/dL (ref 0.44–1.00)
GFR, Estimated: >60 mL/min (ref >60)
Glucose, Bld: 100 mg/dL — ABNORMAL HIGH (ref 70–99)
Potassium: 3.3 mmol/L — ABNORMAL LOW (ref 3.5–5.1)
Sodium: 134 mmol/L — ABNORMAL LOW (ref 135–145)
Total Bilirubin: 0.7 mg/dL (ref 0.0–1.2)
Total Protein: 7 g/dL (ref 6.5–8.1)

## 2023-12-04 LAB — URINALYSIS, ROUTINE W REFLEX MICROSCOPIC
Bilirubin Urine: NEGATIVE
Glucose, UA: NEGATIVE mg/dL
Hgb urine dipstick: NEGATIVE
Ketones, ur: 20 mg/dL — AB
Leukocytes,Ua: NEGATIVE
Nitrite: NEGATIVE
Protein, ur: NEGATIVE mg/dL
Specific Gravity, Urine: 1.015 (ref 1.005–1.030)
pH: 8 (ref 5.0–8.0)

## 2023-12-04 LAB — CBC
HCT: 36.1 % (ref 36.0–46.0)
Hemoglobin: 12.3 g/dL (ref 12.0–15.0)
MCH: 28.1 pg (ref 26.0–34.0)
MCHC: 34.1 g/dL (ref 30.0–36.0)
MCV: 82.4 fL (ref 80.0–100.0)
Platelets: 317 K/uL (ref 150–400)
RBC: 4.38 MIL/uL (ref 3.87–5.11)
RDW: 13.9 % (ref 11.5–15.5)
WBC: 6.4 K/uL (ref 4.0–10.5)
nRBC: 0 % (ref 0.0–0.2)

## 2023-12-04 LAB — LIPASE, BLOOD: Lipase: 21 U/L (ref 11–51)

## 2023-12-04 LAB — MAGNESIUM: Magnesium: 1.7 mg/dL (ref 1.7–2.4)

## 2023-12-04 MED ORDER — THIAMINE HCL 100 MG/ML IJ SOLN
Freq: Once | INTRAVENOUS | Status: AC
  Filled 2023-12-04: qty 1000

## 2023-12-04 MED ORDER — ONDANSETRON HCL 4 MG/2ML IJ SOLN
4.0000 mg | Freq: Three times a day (TID) | INTRAMUSCULAR | Status: DC | PRN
  Administered 2023-12-04 – 2023-12-05 (×2): 4 mg via INTRAVENOUS
  Filled 2023-12-04 (×2): qty 2

## 2023-12-04 MED ORDER — LACTATED RINGERS IV SOLN: INTRAVENOUS | Status: AC

## 2023-12-04 MED ORDER — PROMETHAZINE HCL 25 MG PO TABS: 12.5000 mg | ORAL_TABLET | ORAL | Status: DC | PRN

## 2023-12-04 MED ORDER — LACTATED RINGERS IV BOLUS
1000.0000 mL | Freq: Once | INTRAVENOUS | Status: AC
  Administered 2023-12-04: 1000 mL via INTRAVENOUS

## 2023-12-04 MED ORDER — HYDROXYZINE HCL 50 MG/ML IM SOLN: 50.0000 mg | Freq: Four times a day (QID) | INTRAMUSCULAR | Status: DC | PRN

## 2023-12-04 MED ORDER — POTASSIUM CHLORIDE 10 MEQ/100ML IV SOLN
10.0000 meq | INTRAVENOUS | Status: AC
  Administered 2023-12-04 (×4): 10 meq via INTRAVENOUS
  Filled 2023-12-04 (×4): qty 100

## 2023-12-04 MED ORDER — METOCLOPRAMIDE HCL 10 MG PO TABS: 10.0000 mg | ORAL_TABLET | Freq: Four times a day (QID) | ORAL | Status: DC

## 2023-12-04 MED ORDER — METHYLPREDNISOLONE 4 MG PO TABS: 4.0000 mg | ORAL_TABLET | Freq: Every day | ORAL | Status: DC

## 2023-12-04 MED ORDER — METHYLPREDNISOLONE 4 MG PO TABS: 8.0000 mg | ORAL_TABLET | Freq: Every day | ORAL | Status: DC

## 2023-12-04 MED ORDER — METOCLOPRAMIDE HCL 5 MG/ML IJ SOLN
10.0000 mg | Freq: Once | INTRAMUSCULAR | Status: AC
  Administered 2023-12-04: 10 mg via INTRAVENOUS
  Filled 2023-12-04: qty 2

## 2023-12-04 MED ORDER — ONDANSETRON 4 MG PO TBDP: 4.0000 mg | ORAL_TABLET | Freq: Three times a day (TID) | ORAL | Status: DC | PRN

## 2023-12-04 MED ORDER — METHYLPREDNISOLONE 16 MG PO TABS
16.0000 mg | ORAL_TABLET | Freq: Every day | ORAL | Status: DC
  Filled 2023-12-04: qty 1

## 2023-12-04 MED ORDER — METHYLPREDNISOLONE SODIUM SUCC 125 MG IJ SOLR
48.0000 mg | Freq: Once | INTRAMUSCULAR | Status: AC
  Administered 2023-12-04: 48 mg via INTRAVENOUS
  Filled 2023-12-04: qty 2

## 2023-12-04 MED ORDER — SODIUM CHLORIDE 0.9 % IV SOLN
8.0000 mg | Freq: Three times a day (TID) | INTRAVENOUS | Status: DC | PRN
  Administered 2023-12-04: 8 mg via INTRAVENOUS
  Filled 2023-12-04: qty 4

## 2023-12-04 MED ORDER — FAMOTIDINE IN NACL 20-0.9 MG/50ML-% IV SOLN
20.0000 mg | Freq: Once | INTRAVENOUS | Status: AC
  Administered 2023-12-04: 20 mg via INTRAVENOUS
  Filled 2023-12-04: qty 50

## 2023-12-04 MED ORDER — HALOPERIDOL LACTATE 5 MG/ML IJ SOLN
2.0000 mg | Freq: Once | INTRAMUSCULAR | Status: AC
  Administered 2023-12-04: 2 mg via INTRAVENOUS
  Filled 2023-12-04: qty 1

## 2023-12-04 MED ORDER — MORPHINE SULFATE (PF) 4 MG/ML IV SOLN
4.0000 mg | Freq: Once | INTRAVENOUS | Status: AC
  Administered 2023-12-04: 4 mg via INTRAVENOUS
  Filled 2023-12-04: qty 1

## 2023-12-04 MED ORDER — METHYLPREDNISOLONE 4 MG PO TABS
8.0000 mg | ORAL_TABLET | Freq: Every day | ORAL | Status: DC
  Filled 2023-12-04: qty 2

## 2023-12-04 MED ORDER — ONDANSETRON HCL 4 MG/2ML IJ SOLN
4.0000 mg | Freq: Once | INTRAMUSCULAR | Status: AC
  Administered 2023-12-04: 4 mg via INTRAVENOUS
  Filled 2023-12-04: qty 2

## 2023-12-04 MED ORDER — HYDROXYZINE HCL 50 MG PO TABS: 50.0000 mg | ORAL_TABLET | Freq: Four times a day (QID) | ORAL | Status: DC | PRN

## 2023-12-04 MED ORDER — PROMETHAZINE HCL 25 MG RE SUPP: 12.5000 mg | RECTAL | Status: DC | PRN

## 2023-12-04 MED ORDER — METHYLPREDNISOLONE 16 MG PO TABS
16.0000 mg | ORAL_TABLET | Freq: Every day | ORAL | Status: DC
  Administered 2023-12-05: 16 mg via ORAL
  Filled 2023-12-04: qty 1

## 2023-12-04 MED ORDER — METOCLOPRAMIDE HCL 5 MG/ML IJ SOLN
10.0000 mg | Freq: Four times a day (QID) | INTRAMUSCULAR | Status: DC
  Administered 2023-12-04 – 2023-12-05 (×5): 10 mg via INTRAVENOUS
  Filled 2023-12-04 (×5): qty 2

## 2023-12-04 NOTE — Progress Notes (Signed)
 Will restart Taper since presented as she has come down on the dose.   Vina Solian, MD Attending Obstetrician & Gynecologist, [facility] for [facility], [facility]

## 2023-12-04 NOTE — Progress Notes (Signed)
 Pharmacy Consult:   MEDROL  (METHYLPREDNISOLONE ) TAPER  FOR HYPEREMESIS GRAVIDARUM PATIENTS  The following is a 14 day taper of methylprednisolone  for hyperemesis. Doses on day 1 will be given IV. All doses starting on day 2 will be given PO. (If patient cannot tolerate oral medications, contact the pharmacy to change route to IV.)   Date Day Morning Midday Bedtime  10/16 1 16  mg 16 mg 16 mg  10/17 2 16  mg 16 mg 16 mg  10/18 3 16  mg 16 mg 16 mg  10/19 4 16  mg 8 mg 16 mg  10/20 5 16  mg 8 mg 8 mg  10/21 6 8  mg 8 mg 8 mg  10/22 7 8  mg 4 mg 8 mg  10/23 8 8  mg 4 mg 4 mg  10/24 9 8  mg 4 mg   10/25 10 8  mg 4 mg   10/26 11 8  mg    10/27 12 8  mg    10/28 13 4  mg    10/29 14 4  mg     Check fasting blood sugars daily while on the taper. Notify MD if fasting blood sugar>95.  Rosina DEL Maurissa Ambrose 12/04/2023

## 2023-12-04 NOTE — MAU Note (Signed)
..  Amy Downs is a 28 y.o. at [redacted]w[redacted]d here in MAU reporting: nausea vomiting non stop since yesterday, reports that there appears to be blood in her emesis again.  Took Reglan , prednisone , and has scopolamine  patch placed but none have helped. Reports that she ran out of Zofran  yesterday.  Is also having abdominal pain, that lower feels like cramping and upper hurts differently.  Denies vaginal bleeding.   Pain score: upper 8/10 lower 5/10 Vitals:   12/04/23 0628 12/04/23 0630  BP: (!) 103/57   Pulse: (!) 144   Resp: 19   Temp: 98.6 F (37 C)   SpO2:  100%     FHT: pt unable to sit still d/t vomiting and pain.  Lab orders placed from triage:  UA

## 2023-12-04 NOTE — MAU Provider Note (Addendum)
 History     [id number]  Arrival date and time: 12/04/23 9446    Chief Complaint  Patient presents with   Emesis   Nausea     HPI Amy Downs is a 28 y.o. at [redacted]w[redacted]d by LMP, who presents for nausea and vomiting. It started yesterday. She states that she ran out of Zofran  at that time but doesn't feel that it was a contributing factor. Also endorses hematemesis that started this morning. She feels like the only relief she has gotten has been when she was hospitalized because she never got nauseous.    This is patient's 9th MAU visit this pregnancy. She has lost approximately 27lbs (10%) since being pregnant. She has been receiving outpatient IV fluid and Reglan  infusions. She has been given Reglan , Zofran , phenergan , haldol , diclegis , scopolamine  patch, and robinul  during these visits to varying levels of success. Of note she was recently admitted from 10/11 to 10/12 for hyperemesis and started on steroid taper. She is still taking steroid taper at home.    --/--/A POS (10/11 1655)  Past Medical History:  Diagnosis Date   Abscess    Anxiety    no meds   HSV-2 infection    UTI (urinary tract infection)     Past Surgical History:  Procedure Laterality Date   CESAREAN SECTION     twin IUP with PreE   HERNIA REPAIR     WISDOM TOOTH EXTRACTION      Family History  Problem Relation Age of Onset   Stroke Mother 22   Heart disease Father     Social History   Socioeconomic History   Marital status: Single    Spouse name: Not on file   Number of children: Not on file   Years of education: Not on file   Highest education level: Not on file  Occupational History   Not on file  Tobacco Use   Smoking status: Never   Smokeless tobacco: Never  Vaping Use   Vaping status: Never Used  Substance and Sexual Activity   Alcohol use: No   Drug use: Not Currently    Types: Marijuana    Comment: wk she found was last   Sexual activity: Not Currently    Birth  control/protection: None  Other Topics Concern   Not on file  Social History Narrative   Not on file   Social Drivers of Health   Financial Resource Strain: Not on File (07/12/2022)   Received from [employer]    Financial Resource Strain: 0  Food Insecurity: Food Insecurity Present (11/29/2023)   Hunger Vital Sign    Worried About Running Out of Food in the Last Year: Sometimes true    Ran Out of Food in the Last Year: Sometimes true  Transportation Needs: No Transportation Needs (12/04/2023)   PRAPARE - Administrator, Civil Service (Medical): No    Lack of Transportation (Non-Medical): No  Physical Activity: Not on File (07/12/2022)   Received from [facility]   Physical Activity    Physical Activity: 0  Stress: Not on File (07/12/2022)   Received from [facility]   Stress    Stress: 0  Social Connections: Not on File (10/23/2022)   Received from [facility]   Social Connections    Connectedness: 0  Intimate Partner Violence: Not At Risk (11/29/2023)   Humiliation, Afraid, Rape, and Kick questionnaire    Fear of Current or Ex-Partner: No    Emotionally Abused: No  Physically Abused: No    Sexually Abused: No    Allergies  Allergen Reactions   Pollen Extract Itching    No current facility-administered medications on file prior to encounter.   Current Outpatient Medications on File Prior to Encounter  Medication Sig Dispense Refill   famotidine  (PEPCID ) 20 MG tablet Take 1 tablet (20 mg total) by mouth 2 (two) times daily as needed for heartburn or indigestion. 60 tablet 0   metoCLOPramide  (REGLAN ) 10 MG tablet Take 1 tablet (10 mg total) by mouth every 6 (six) hours. 30 tablet 5   ondansetron  (ZOFRAN -ODT) 4 MG disintegrating tablet Take 1 tablet (4 mg total) by mouth every 8 (eight) hours as needed for up to 120 doses for nausea or vomiting. 30 tablet 3   scopolamine  (TRANSDERM-SCOP) 1 MG/3DAYS Place 1 patch onto the skin every 3 (three) days.      glycopyrrolate  (ROBINUL ) 2 MG tablet Take 1 tablet (2 mg total) by mouth 3 (three) times daily as needed. 30 tablet 3   methylPREDNISolone  (MEDROL ) 4 MG tablet Take 4 tablets (16 mg total) by mouth at bedtime for 3 days, THEN 2 tablets (8 mg total) at bedtime for 2 days, THEN 1 tablet (4 mg total) at bedtime for 2 days. 18 tablet 0   polyethylene glycol powder (GLYCOLAX /MIRALAX ) 17 GM/SCOOP powder Use once daily per package instructions. 850 g 11    Pertinent positives and negative per HPI, all others reviewed and negative  Physical Exam   BP (!) 103/57 (BP Location: Right Arm)   Pulse (!) 144   Temp 98.6 F (37 C) (Oral)   Resp 19   Ht 5' 9 (1.753 m)   Wt 115.1 kg   LMP 09/22/2023 (Exact Date)   SpO2 100%   BMI 37.46 kg/m   Patient Vitals for the past 24 hrs:  BP Temp Temp src Pulse Resp SpO2 Height Weight  12/04/23 0630 -- -- -- -- -- 100 % -- --  12/04/23 0628 (!) 103/57 98.6 F (37 C) Oral (!) 144 19 -- -- --  12/04/23 0612 -- -- -- -- -- -- 5' 9 (1.753 m) 115.1 kg    Physical Exam Vitals and nursing note reviewed.  Constitutional:      Appearance: She is well-developed.  HENT:     Head: Normocephalic and atraumatic.     Mouth/Throat:     Mouth: Mucous membranes are moist.  Eyes:     Extraocular Movements: Extraocular movements intact.  Cardiovascular:     Rate and Rhythm: Normal rate and regular rhythm.  Pulmonary:     Effort: Pulmonary effort is normal.  Abdominal:     Palpations: Abdomen is soft.     Tenderness: There is abdominal tenderness.  Skin:    Capillary Refill: Capillary refill takes less than 2 seconds.  Neurological:     General: No focal deficit present.     Mental Status: She is alert.       Labs Results for orders placed or performed during the hospital encounter of 12/04/23 (from the past 24 hours)  Comprehensive metabolic panel     Status: Abnormal   Collection Time: 12/04/23  6:29 AM  Result Value Ref Range   Sodium 134 (L) 135 -  145 mmol/L   Potassium 3.3 (L) 3.5 - 5.1 mmol/L   Chloride 104 98 - 111 mmol/L   CO2 18 (L) 22 - 32 mmol/L   Glucose, Bld 100 (H) 70 - 99 mg/dL   BUN  5 (L) 6 - 20 mg/dL   Creatinine, Ser 9.28 0.44 - 1.00 mg/dL   Calcium 9.2 8.9 - 89.6 mg/dL   Total Protein 7.0 6.5 - 8.1 g/dL   Albumin 3.2 (L) 3.5 - 5.0 g/dL   AST 18 15 - 41 U/L   ALT 23 0 - 44 U/L   Alkaline Phosphatase 38 38 - 126 U/L   Total Bilirubin 0.7 0.0 - 1.2 mg/dL   GFR, Estimated >39 >39 mL/min   Anion gap 12 5 - 15  CBC     Status: None   Collection Time: 12/04/23  6:29 AM  Result Value Ref Range   WBC 6.4 4.0 - 10.5 K/uL   RBC 4.38 3.87 - 5.11 MIL/uL   Hemoglobin 12.3 12.0 - 15.0 g/dL   HCT 63.8 63.9 - 53.9 %   MCV 82.4 80.0 - 100.0 fL   MCH 28.1 26.0 - 34.0 pg   MCHC 34.1 30.0 - 36.0 g/dL   RDW 86.0 88.4 - 84.4 %   Platelets 317 150 - 400 K/uL   nRBC 0.0 0.0 - 0.2 %  Lipase, blood     Status: None   Collection Time: 12/04/23  6:29 AM  Result Value Ref Range   Lipase 21 11 - 51 U/L  Magnesium     Status: None   Collection Time: 12/04/23  6:29 AM  Result Value Ref Range   Magnesium 1.7 1.7 - 2.4 mg/dL  Urinalysis, Routine w reflex microscopic -Urine, Clean Catch     Status: Abnormal   Collection Time: 12/04/23  8:46 AM  Result Value Ref Range   Color, Urine YELLOW YELLOW   APPearance CLEAR CLEAR   Specific Gravity, Urine 1.015 1.005 - 1.030   pH 8.0 5.0 - 8.0   Glucose, UA NEGATIVE NEGATIVE mg/dL   Hgb urine dipstick NEGATIVE NEGATIVE   Bilirubin Urine NEGATIVE NEGATIVE   Ketones, ur 20 (A) NEGATIVE mg/dL   Protein, ur NEGATIVE NEGATIVE mg/dL   Nitrite NEGATIVE NEGATIVE   Leukocytes,Ua NEGATIVE NEGATIVE    Imaging No results found.  MAU Course  Procedures Lab Orders         Urinalysis, Routine w reflex microscopic -Urine, Clean Catch         Comprehensive metabolic panel         CBC         Lipase, blood         Magnesium         Comprehensive metabolic panel         CBC         Magnesium          Calcium         Phosphorus    Meds ordered this encounter  Medications   lactated ringers  bolus 1,000 mL   metoCLOPramide  (REGLAN ) injection 10 mg   famotidine  (PEPCID ) IVPB 20 mg premix   haloperidol  lactate (HALDOL ) injection 2 mg   ondansetron  (ZOFRAN ) injection 4 mg   morphine  (PF) 4 MG/ML injection 4 mg   lactated ringers  1,000 mL with thiamine 100 mg, folic acid 1 mg, M.V.I. Adult 10 mL infusion   FOLLOWED BY Linked Order Group    methylPREDNISolone  (MEDROL ) tablet 8 mg    methylPREDNISolone  (MEDROL ) tablet 4 mg   OR Linked Order Group    metoCLOPramide  (REGLAN ) tablet 10 mg    metoCLOPramide  (REGLAN ) injection 10 mg   potassium chloride  10 mEq in 100 mL IVPB   OR Linked  Order Group    promethazine  (PHENERGAN ) tablet 12.5-25 mg    promethazine  (PHENERGAN ) suppository 12.5-25 mg   OR Linked Order Group    ondansetron  (ZOFRAN -ODT) disintegrating tablet 4-8 mg    ondansetron  (ZOFRAN ) injection 4 mg    ondansetron  (ZOFRAN ) 8 mg in sodium chloride  0.9 % 50 mL IVPB   OR Linked Order Group    hydrOXYzine (ATARAX) tablet 50 mg    hydrOXYzine (VISTARIL) injection 50 mg   Imaging Orders  No imaging studies ordered today    MDM  Assessment and Plan   Amy Downs is a 28 yo G3P1103 at 10 weeks and 3 days presenting for hyperemesis gravidarum.  Hyperemesis gravidarum Patient has been seen 9 times in the MAU for hyperemesis.  She was most recently admitted on 10/11 and started on steroid taper.  Has been unable to keep steroids down at home.In the MAU she has been given Reglan , morphine , Zofran , Haldol  without relief.  Attempted p.o. liquid and solid challenge but vomited everything up.  Will admit to ops to control hyperemesis and replete electrolytes.  She is mildly hypokalemic. -Admit to observation on OB specially care - IV antiemetics - Continue steroid taper - Morning BMP and CBC - S/p IV Pepcid  - Hopeful for control of hyperemesis and discharge in the next day or  2.

## 2023-12-04 NOTE — H&P (Signed)
 History      [id number]   Arrival date and time: 12/04/23 9446        Chief Complaint  Patient presents with   Emesis   Nausea        HPI Amy Downs is a 28 y.o. at [redacted]w[redacted]d by LMP, who presents for nausea and vomiting. It started yesterday. She states that she ran out of Zofran  at that time but doesn't feel that it was a contributing factor. Also endorses hematemesis that started this morning. She feels like the only relief she has gotten has been when she was hospitalized because she never got nauseous.     This is patient's 9th MAU visit this pregnancy. She has lost approximately 27lbs (10%) since being pregnant. She has been receiving outpatient IV fluid and Reglan  infusions. She has been given Reglan , Zofran , phenergan , haldol , diclegis , scopolamine  patch, and robinul  during these visits to varying levels of success. Of note she was recently admitted from 10/11 to 10/12 for hyperemesis and started on steroid taper. She is still taking steroid taper at home.      --/--/A POS (10/11 1655)       Past Medical History:  Diagnosis Date   Abscess     Anxiety      no meds   HSV-2 infection     UTI (urinary tract infection)                 Past Surgical History:  Procedure Laterality Date   CESAREAN SECTION        twin IUP with PreE   HERNIA REPAIR       WISDOM TOOTH EXTRACTION                   Family History  Problem Relation Age of Onset   Stroke Mother 31   Heart disease Father            Social History         Socioeconomic History   Marital status: Single      Spouse name: Not on file   Number of children: Not on file   Years of education: Not on file   Highest education level: Not on file  Occupational History   Not on file  Tobacco Use   Smoking status: Never   Smokeless tobacco: Never  Vaping Use   Vaping status: Never Used  Substance and Sexual Activity   Alcohol use: No   Drug use: Not Currently      Types: Marijuana      Comment: wk  she found was last   Sexual activity: Not Currently      Birth control/protection: None  Other Topics Concern   Not on file  Social History Narrative   Not on file    Social Drivers of Health        Financial Resource Strain: Not on File (07/12/2022)    Received from [employer]     Financial Resource Strain: 0  Food Insecurity: Food Insecurity Present (11/29/2023)    Hunger Vital Sign     Worried About Running Out of Food in the Last Year: Sometimes true     Ran Out of Food in the Last Year: Sometimes true  Transportation Needs: No Transportation Needs (12/04/2023)    PRAPARE - Therapist, art (Medical): No     Lack of Transportation (Non-Medical): No  Physical Activity: Not on File (  07/12/2022)    Received from [facility]    Physical Activity     Physical Activity: 0  Stress: Not on File (07/12/2022)    Received from [facility]    Stress     Stress: 0  Social Connections: Not on File (10/23/2022)    Received from [facility]    Social Connections     Connectedness: 0  Intimate Partner Violence: Not At Risk (11/29/2023)    Humiliation, Afraid, Rape, and Kick questionnaire     Fear of Current or Ex-Partner: No     Emotionally Abused: No     Physically Abused: No     Sexually Abused: No      Allergies      Allergies  Allergen Reactions   Pollen Extract Itching        Medications Ordered Prior to Encounter  No current facility-administered medications on file prior to encounter.          Current Outpatient Medications on File Prior to Encounter  Medication Sig Dispense Refill   famotidine  (PEPCID ) 20 MG tablet Take 1 tablet (20 mg total) by mouth 2 (two) times daily as needed for heartburn or indigestion. 60 tablet 0   metoCLOPramide  (REGLAN ) 10 MG tablet Take 1 tablet (10 mg total) by mouth every 6 (six) hours. 30 tablet 5   ondansetron  (ZOFRAN -ODT) 4 MG disintegrating tablet Take 1 tablet (4 mg total) by mouth every 8 (eight)  hours as needed for up to 120 doses for nausea or vomiting. 30 tablet 3   scopolamine  (TRANSDERM-SCOP) 1 MG/3DAYS Place 1 patch onto the skin every 3 (three) days.       glycopyrrolate  (ROBINUL ) 2 MG tablet Take 1 tablet (2 mg total) by mouth 3 (three) times daily as needed. 30 tablet 3   methylPREDNISolone  (MEDROL ) 4 MG tablet Take 4 tablets (16 mg total) by mouth at bedtime for 3 days, THEN 2 tablets (8 mg total) at bedtime for 2 days, THEN 1 tablet (4 mg total) at bedtime for 2 days. 18 tablet 0   polyethylene glycol powder (GLYCOLAX /MIRALAX ) 17 GM/SCOOP powder Use once daily per package instructions. 850 g 11        Pertinent positives and negative per HPI, all others reviewed and negative   Physical Exam    BP (!) 103/57 (BP Location: Right Arm)   Pulse (!) 144   Temp 98.6 F (37 C) (Oral)   Resp 19   Ht 5' 9 (1.753 m)   Wt 115.1 kg   LMP 09/22/2023 (Exact Date)   SpO2 100%   BMI 37.46 kg/m    Patient Vitals for the past 24 hrs:   BP Temp Temp src Pulse Resp SpO2 Height Weight  12/04/23 0630 -- -- -- -- -- 100 % -- --  12/04/23 0628 (!) 103/57 98.6 F (37 C) Oral (!) 144 19 -- -- --  12/04/23 0612 -- -- -- -- -- -- 5' 9 (1.753 m) 115.1 kg      Physical Exam Vitals and nursing note reviewed.  Constitutional:      Appearance: She is well-developed.  HENT:     Head: Normocephalic and atraumatic.     Mouth/Throat:     Mouth: Mucous membranes are moist.  Eyes:     Extraocular Movements: Extraocular movements intact.  Cardiovascular:     Rate and Rhythm: Normal rate and regular rhythm.  Pulmonary:     Effort: Pulmonary effort is normal.  Abdominal:     Palpations: Abdomen  is soft.     Tenderness: There is abdominal tenderness.  Skin:    Capillary Refill: Capillary refill takes less than 2 seconds.  Neurological:     General: No focal deficit present.     Mental Status: She is alert.         Labs Lab Results Last 24 Hours       Results for orders placed or  performed during the hospital encounter of 12/04/23 (from the past 24 hours)  Comprehensive metabolic panel     Status: Abnormal    Collection Time: 12/04/23  6:29 AM  Result Value Ref Range    Sodium 134 (L) 135 - 145 mmol/L    Potassium 3.3 (L) 3.5 - 5.1 mmol/L    Chloride 104 98 - 111 mmol/L    CO2 18 (L) 22 - 32 mmol/L    Glucose, Bld 100 (H) 70 - 99 mg/dL    BUN 5 (L) 6 - 20 mg/dL    Creatinine, Ser 9.28 0.44 - 1.00 mg/dL    Calcium 9.2 8.9 - 89.6 mg/dL    Total Protein 7.0 6.5 - 8.1 g/dL    Albumin 3.2 (L) 3.5 - 5.0 g/dL    AST 18 15 - 41 U/L    ALT 23 0 - 44 U/L    Alkaline Phosphatase 38 38 - 126 U/L    Total Bilirubin 0.7 0.0 - 1.2 mg/dL    GFR, Estimated >39 >39 mL/min    Anion gap 12 5 - 15  CBC     Status: None    Collection Time: 12/04/23  6:29 AM  Result Value Ref Range    WBC 6.4 4.0 - 10.5 K/uL    RBC 4.38 3.87 - 5.11 MIL/uL    Hemoglobin 12.3 12.0 - 15.0 g/dL    HCT 63.8 63.9 - 53.9 %    MCV 82.4 80.0 - 100.0 fL    MCH 28.1 26.0 - 34.0 pg    MCHC 34.1 30.0 - 36.0 g/dL    RDW 86.0 88.4 - 84.4 %    Platelets 317 150 - 400 K/uL    nRBC 0.0 0.0 - 0.2 %  Lipase, blood     Status: None    Collection Time: 12/04/23  6:29 AM  Result Value Ref Range    Lipase 21 11 - 51 U/L  Magnesium     Status: None    Collection Time: 12/04/23  6:29 AM  Result Value Ref Range    Magnesium 1.7 1.7 - 2.4 mg/dL  Urinalysis, Routine w reflex microscopic -Urine, Clean Catch     Status: Abnormal    Collection Time: 12/04/23  8:46 AM  Result Value Ref Range    Color, Urine YELLOW YELLOW    APPearance CLEAR CLEAR    Specific Gravity, Urine 1.015 1.005 - 1.030    pH 8.0 5.0 - 8.0    Glucose, UA NEGATIVE NEGATIVE mg/dL    Hgb urine dipstick NEGATIVE NEGATIVE    Bilirubin Urine NEGATIVE NEGATIVE    Ketones, ur 20 (A) NEGATIVE mg/dL    Protein, ur NEGATIVE NEGATIVE mg/dL    Nitrite NEGATIVE NEGATIVE    Leukocytes,Ua NEGATIVE NEGATIVE        Imaging No results found.   MAU  Course  Procedures Lab Orders         Urinalysis, Routine w reflex microscopic -Urine, Clean Catch         Comprehensive metabolic panel  CBC         Lipase, blood         Magnesium         Comprehensive metabolic panel         CBC         Magnesium         Calcium         Phosphorus        Meds ordered this encounter  Medications   lactated ringers  bolus 1,000 mL   metoCLOPramide  (REGLAN ) injection 10 mg   famotidine  (PEPCID ) IVPB 20 mg premix   haloperidol  lactate (HALDOL ) injection 2 mg   ondansetron  (ZOFRAN ) injection 4 mg   morphine  (PF) 4 MG/ML injection 4 mg   lactated ringers  1,000 mL with thiamine 100 mg, folic acid 1 mg, M.V.I. Adult 10 mL infusion   FOLLOWED BY Linked Order Group     methylPREDNISolone  (MEDROL ) tablet 8 mg     methylPREDNISolone  (MEDROL ) tablet 4 mg   OR Linked Order Group     metoCLOPramide  (REGLAN ) tablet 10 mg     metoCLOPramide  (REGLAN ) injection 10 mg   potassium chloride  10 mEq in 100 mL IVPB   OR Linked Order Group     promethazine  (PHENERGAN ) tablet 12.5-25 mg     promethazine  (PHENERGAN ) suppository 12.5-25 mg   OR Linked Order Group     ondansetron  (ZOFRAN -ODT) disintegrating tablet 4-8 mg     ondansetron  (ZOFRAN ) injection 4 mg     ondansetron  (ZOFRAN ) 8 mg in sodium chloride  0.9 % 50 mL IVPB   OR Linked Order Group     hydrOXYzine (ATARAX) tablet 50 mg     hydrOXYzine (VISTARIL) injection 50 mg    Imaging Orders  No imaging studies ordered today      MDM   Assessment and Plan    Amy Downs is a 28 yo G3P1103 at 10 weeks and 3 days presenting for hyperemesis gravidarum.   Hyperemesis gravidarum Patient has been seen 9 times in the MAU for hyperemesis.  She was most recently admitted on 10/11 and started on steroid taper.  Has been unable to keep steroids down at home.In the MAU she has been given Reglan , morphine , Zofran , Haldol  without relief.  Attempted p.o. liquid and solid challenge but vomited everything up.   Will admit to ops to control hyperemesis and replete electrolytes.  She is mildly hypokalemic. -Admit to observation on OB specially care - IV antiemetics - Continue steroid taper - Morning BMP and CBC - S/p IV Pepcid  - Hopeful for control of hyperemesis and discharge in the next day or 2.

## 2023-12-04 NOTE — Telephone Encounter (Signed)
 New OB Intake and provider appointment rescheduled due to pt being admitted to hospital for hyperemesis.  Pt made aware of new appointments, Intake 12/09/23 at 11:15 and New OB visit 12/11/23 at 09:15am.    [place], RN

## 2023-12-05 ENCOUNTER — Encounter (HOSPITAL_COMMUNITY): Payer: Self-pay

## 2023-12-05 ENCOUNTER — Other Ambulatory Visit (HOSPITAL_COMMUNITY): Payer: Self-pay

## 2023-12-05 ENCOUNTER — Other Ambulatory Visit: Payer: Self-pay | Admitting: Obstetrics and Gynecology

## 2023-12-05 DIAGNOSIS — O210 Mild hyperemesis gravidarum: Secondary | ICD-10-CM

## 2023-12-05 DIAGNOSIS — Z3A10 10 weeks gestation of pregnancy: Secondary | ICD-10-CM

## 2023-12-05 LAB — CBC
HCT: 32.3 % — ABNORMAL LOW (ref 36.0–46.0)
Hemoglobin: 11.1 g/dL — ABNORMAL LOW (ref 12.0–15.0)
MCH: 28.2 pg (ref 26.0–34.0)
MCHC: 34.4 g/dL (ref 30.0–36.0)
MCV: 82.2 fL (ref 80.0–100.0)
Platelets: 282 K/uL (ref 150–400)
RBC: 3.93 MIL/uL (ref 3.87–5.11)
RDW: 13.9 % (ref 11.5–15.5)
WBC: 10 K/uL (ref 4.0–10.5)
nRBC: 0 % (ref 0.0–0.2)

## 2023-12-05 LAB — MAGNESIUM: Magnesium: 1.7 mg/dL (ref 1.7–2.4)

## 2023-12-05 LAB — PHOSPHORUS: Phosphorus: 3.6 mg/dL (ref 2.5–4.6)

## 2023-12-05 LAB — TSH: TSH: 0.249 u[IU]/mL — ABNORMAL LOW (ref 0.350–4.500)

## 2023-12-05 LAB — GLUCOSE, CAPILLARY: Glucose-Capillary: 93 mg/dL (ref 70–99)

## 2023-12-05 LAB — POTASSIUM: Potassium: 3.9 mmol/L (ref 3.5–5.1)

## 2023-12-05 LAB — T4, FREE: Free T4: 0.84 ng/dL (ref 0.61–1.12)

## 2023-12-05 LAB — CALCIUM: Calcium: 9.1 mg/dL (ref 8.9–10.3)

## 2023-12-05 MED ORDER — METOCLOPRAMIDE HCL 10 MG PO TABS
10.0000 mg | ORAL_TABLET | Freq: Four times a day (QID) | ORAL | 0 refills | Status: DC
  Filled 2023-12-05: qty 60, 15d supply, fill #0

## 2023-12-05 MED ORDER — METHYLPREDNISOLONE 16 MG PO TABS: 16.0000 mg | ORAL_TABLET | Freq: Three times a day (TID) | ORAL | 0 refills | Status: AC

## 2023-12-05 MED ORDER — METHYLPREDNISOLONE 4 MG PO TABS
16.0000 mg | ORAL_TABLET | Freq: Three times a day (TID) | ORAL | 1 refills | Status: DC
  Filled 2023-12-05: qty 93, 8d supply, fill #0

## 2023-12-05 MED ORDER — ONDANSETRON 4 MG PO TBDP
4.0000 mg | ORAL_TABLET | Freq: Three times a day (TID) | ORAL | 0 refills | Status: AC | PRN
  Filled 2023-12-05: qty 30, 5d supply, fill #0

## 2023-12-05 MED ORDER — METHYLPREDNISOLONE 4 MG PO TABS
ORAL_TABLET | ORAL | 0 refills | Status: DC
  Filled 2023-12-05: qty 45, 11d supply, fill #0

## 2023-12-05 MED FILL — Methylprednisolone Tab 16 MG: 16.0000 mg | ORAL | 14 days supply | Qty: 42 | Fill #0 | Status: CN

## 2023-12-05 NOTE — Progress Notes (Signed)
 TOC pharmacy did not have 16 mg so sent this two week therapy to her preferred pharmacy.   Vina Solian, MD Attending Obstetrician & Gynecologist, [facility] for [facility], [facility]

## 2023-12-05 NOTE — Plan of Care (Signed)
  Problem: Education: Goal: Knowledge of disease or condition will improve Outcome: Progressing Goal: Knowledge of the prescribed therapeutic regimen will improve Outcome: Progressing   Problem: Bowel/Gastric: Goal: Occurences of nausea and/or vomiting will decrease Outcome: Progressing   Problem: Fluid Volume: Goal: Maintenance of adequate hydration will improve Outcome: Progressing   Problem: Nutritional: Goal: Achievement of adequate weight for body size and type will improve Outcome: Progressing   Problem: Education: Goal: Knowledge of General Education information will improve Description: Including pain rating scale, medication(s)/side effects and non-pharmacologic comfort measures Outcome: Progressing   Problem: Health Behavior/Discharge Planning: Goal: Ability to manage health-related needs will improve Outcome: Progressing   Problem: Clinical Measurements: Goal: Ability to maintain clinical measurements within normal limits will improve Outcome: Progressing Goal: Will remain free from infection Outcome: Progressing Goal: Diagnostic test results will improve Outcome: Progressing Goal: Respiratory complications will improve Outcome: Progressing Goal: Cardiovascular complication will be avoided Outcome: Progressing   Problem: Activity: Goal: Risk for activity intolerance will decrease Outcome: Progressing   Problem: Nutrition: Goal: Adequate nutrition will be maintained Outcome: Progressing   Problem: Coping: Goal: Level of anxiety will decrease Outcome: Progressing   Problem: Elimination: Goal: Will not experience complications related to bowel motility Outcome: Progressing Goal: Will not experience complications related to urinary retention Outcome: Progressing   Problem: Pain Managment: Goal: General experience of comfort will improve and/or be controlled Outcome: Progressing   Problem: Safety: Goal: Ability to remain free from injury will  improve Outcome: Progressing   Problem: Skin Integrity: Goal: Risk for impaired skin integrity will decrease Outcome: Progressing

## 2023-12-05 NOTE — Plan of Care (Signed)
  Problem: Education: Goal: Knowledge of disease or condition will improve 12/05/2023 1252 by Arlys Alan RAMAN, RN Outcome: Completed/Met 12/05/2023 0723 by Arlys Alan RAMAN, RN Outcome: Progressing Goal: Knowledge of the prescribed therapeutic regimen will improve 12/05/2023 1252 by Arlys Alan RAMAN, RN Outcome: Completed/Met 12/05/2023 0723 by Arlys Alan RAMAN, RN Outcome: Progressing   Problem: Bowel/Gastric: Goal: Occurences of nausea and/or vomiting will decrease 12/05/2023 1252 by Arlys Alan RAMAN, RN Outcome: Completed/Met 12/05/2023 0723 by Arlys Alan RAMAN, RN Outcome: Progressing   Problem: Fluid Volume: Goal: Maintenance of adequate hydration will improve 12/05/2023 1252 by Arlys Alan RAMAN, RN Outcome: Completed/Met 12/05/2023 0723 by Arlys Alan RAMAN, RN Outcome: Progressing   Problem: Nutritional: Goal: Achievement of adequate weight for body size and type will improve 12/05/2023 1252 by Arlys Alan RAMAN, RN Outcome: Completed/Met 12/05/2023 0723 by Arlys Alan RAMAN, RN Outcome: Progressing   Problem: Education: Goal: Knowledge of General Education information will improve Description: Including pain rating scale, medication(s)/side effects and non-pharmacologic comfort measures 12/05/2023 1252 by Arlys Alan RAMAN, RN Outcome: Completed/Met 12/05/2023 0723 by Arlys Alan RAMAN, RN Outcome: Progressing   Problem: Health Behavior/Discharge Planning: Goal: Ability to manage health-related needs will improve 12/05/2023 1252 by Arlys Alan RAMAN, RN Outcome: Completed/Met 12/05/2023 0723 by Arlys Alan RAMAN, RN Outcome: Progressing   Problem: Clinical Measurements: Goal: Ability to maintain clinical measurements within normal limits will improve 12/05/2023 1252 by Arlys Alan RAMAN, RN Outcome: Completed/Met 12/05/2023 0723 by Arlys Alan RAMAN, RN Outcome: Progressing Goal: Will remain free from infection 12/05/2023 1252 by Arlys Alan RAMAN, RN Outcome: Completed/Met 12/05/2023 0723 by Arlys Alan RAMAN,  RN Outcome: Progressing Goal: Diagnostic test results will improve 12/05/2023 1252 by Arlys Alan RAMAN, RN Outcome: Completed/Met 12/05/2023 0723 by Arlys Alan RAMAN, RN Outcome: Progressing Goal: Respiratory complications will improve 12/05/2023 1252 by Arlys Alan RAMAN, RN Outcome: Completed/Met 12/05/2023 0723 by Arlys Alan RAMAN, RN Outcome: Progressing Goal: Cardiovascular complication will be avoided 12/05/2023 1252 by Arlys Alan RAMAN, RN Outcome: Completed/Met 12/05/2023 0723 by Arlys Alan RAMAN, RN Outcome: Progressing   Problem: Activity: Goal: Risk for activity intolerance will decrease 12/05/2023 1252 by Arlys Alan RAMAN, RN Outcome: Completed/Met 12/05/2023 0723 by Arlys Alan RAMAN, RN Outcome: Progressing   Problem: Nutrition: Goal: Adequate nutrition will be maintained 12/05/2023 1252 by Arlys Alan RAMAN, RN Outcome: Completed/Met 12/05/2023 0723 by Arlys Alan RAMAN, RN Outcome: Progressing   Problem: Coping: Goal: Level of anxiety will decrease 12/05/2023 1252 by Arlys Alan RAMAN, RN Outcome: Completed/Met 12/05/2023 0723 by Arlys Alan RAMAN, RN Outcome: Progressing   Problem: Elimination: Goal: Will not experience complications related to bowel motility 12/05/2023 1252 by Arlys Alan RAMAN, RN Outcome: Completed/Met 12/05/2023 0723 by Arlys Alan RAMAN, RN Outcome: Progressing Goal: Will not experience complications related to urinary retention 12/05/2023 1252 by Arlys Alan RAMAN, RN Outcome: Completed/Met 12/05/2023 0723 by Arlys Alan RAMAN, RN Outcome: Progressing   Problem: Pain Managment: Goal: General experience of comfort will improve and/or be controlled 12/05/2023 1252 by Arlys Alan RAMAN, RN Outcome: Completed/Met 12/05/2023 0723 by Arlys Alan RAMAN, RN Outcome: Progressing   Problem: Safety: Goal: Ability to remain free from injury will improve 12/05/2023 1252 by Arlys Alan RAMAN, RN Outcome: Completed/Met 12/05/2023 0723 by Arlys Alan RAMAN, RN Outcome: Progressing   Problem: Skin  Integrity: Goal: Risk for impaired skin integrity will decrease 12/05/2023 1252 by Arlys Alan RAMAN, RN Outcome: Completed/Met 12/05/2023 0723 by Arlys Alan RAMAN, RN Outcome: Progressing

## 2023-12-05 NOTE — Progress Notes (Signed)
 Pharmacy Consult:   MEDROL  (METHYLPREDNISOLONE ) TAPER  FOR HYPEREMESIS GRAVIDARUM PATIENTS  The following is a taper of methylprednisolone  for hyperemesis.   Date Day Morning Midday Bedtime  10/17 - 10/30 1-14 16 mg 16 mg 16 mg  10/31 15 16  mg 8 mg 16 mg  11/1 16 16  mg 8 mg 8 mg  11/2 17 8  mg 8 mg 8 mg  11/3 18 8  mg 4 mg 8 mg  11/4 19 8  mg 4 mg 4 mg  11/5 20 8  mg 4 mg    11/6 21 8  mg 4 mg    11/7 22 8  mg      11/8 23 8  mg      11/9 24 4  mg      11/10 25 4  mg         Amy Downs 12/05/2023

## 2023-12-05 NOTE — Discharge Summary (Signed)
 Antenatal Physician Discharge Summary  Patient ID: Amy Downs MRN: [id number] DOB/AGE: 30/26/1997 27 y.o.  Admit date: 12/04/2023 Discharge date: 12/05/2023  Admission Diagnoses: Hyperemesis gravidarum [O21.0] Hypokalemia  Discharge Diagnoses:  Same  Prenatal Procedures: None  Consults: None  Brief Hospital Course:  Amy Downs is a 28 y.o. G3P1103 at [redacted]w[redacted]d who was admitted for hyperemesis. Her symptoms were not able to be controlled as an outpatient, so she was admitted for observation.   She was given IV fluids and started on antiemetics which helped her symptoms significantly.  Her electrolytes were repleted as needed. She then tolerated clear liquid diet and was advanced to regular diet, which she also tolerated. She had no other obstetric concerns and was deemed stable for discharge to home with outpatient follow up. We discussed continuing the taper for longer since this seems most effective combined with the Reglan . She also finds the Zofran  ODT very helpful.   Discharge Exam: Temp:  [97.6 F (36.4 C)-98.4 F (36.9 C)] 98.1 F (36.7 C) (10/17 0728) Pulse Rate:  [77-84] 77 (10/17 0728) Resp:  [17-19] 19 (10/17 0728) BP: (112-137)/(59-90) 121/90 (10/17 0728) SpO2:  [97 %-100 %] 100 % (10/17 0728) Weight:  [116.6 kg] 116.6 kg (10/17 0443) Physical Examination: CONSTITUTIONAL: Well-developed, well-nourished female in no acute distress.  HENT:  Normocephalic, atraumatic, External right and left ear normal.  EYES: Conjunctivae and EOM are normal. Pupils are equal, round, and reactive to light. No scleral icterus.  NECK: Normal range of motion, supple, no masses SKIN: Skin is warm and dry. No rash noted. Not diaphoretic. No erythema. No pallor. NEUROLOGIC: Alert and oriented to person, place, and time. Normal reflexes, muscle tone coordination. No cranial nerve deficit noted. PSYCHIATRIC: Normal mood and affect. Normal behavior. Normal judgment and thought  content. CARDIOVASCULAR: Normal heart rate noted, regular rhythm RESPIRATORY: Effort and breath sounds normal, no problems with respiration noted MUSCULOSKELETAL: Normal range of motion. No edema and no tenderness. 2+ distal pulses. ABDOMEN: Soft, nontender, nondistended, gravid.   Significant Diagnostic Studies:  Results for orders placed or performed during the hospital encounter of 12/04/23 (from the past week)  Comprehensive metabolic panel   Collection Time: 12/04/23  6:29 AM  Result Value Ref Range   Sodium 134 (L) 135 - 145 mmol/L   Potassium 3.3 (L) 3.5 - 5.1 mmol/L   Chloride 104 98 - 111 mmol/L   CO2 18 (L) 22 - 32 mmol/L   Glucose, Bld 100 (H) 70 - 99 mg/dL   BUN 5 (L) 6 - 20 mg/dL   Creatinine, Ser 9.28 0.44 - 1.00 mg/dL   Calcium 9.2 8.9 - 89.6 mg/dL   Total Protein 7.0 6.5 - 8.1 g/dL   Albumin 3.2 (L) 3.5 - 5.0 g/dL   AST 18 15 - 41 U/L   ALT 23 0 - 44 U/L   Alkaline Phosphatase 38 38 - 126 U/L   Total Bilirubin 0.7 0.0 - 1.2 mg/dL   GFR, Estimated >39 >39 mL/min   Anion gap 12 5 - 15  CBC   Collection Time: 12/04/23  6:29 AM  Result Value Ref Range   WBC 6.4 4.0 - 10.5 K/uL   RBC 4.38 3.87 - 5.11 MIL/uL   Hemoglobin 12.3 12.0 - 15.0 g/dL   HCT 63.8 63.9 - 53.9 %   MCV 82.4 80.0 - 100.0 fL   MCH 28.1 26.0 - 34.0 pg   MCHC 34.1 30.0 - 36.0 g/dL   RDW 86.0 88.4 - 84.4 %  Platelets 317 150 - 400 K/uL   nRBC 0.0 0.0 - 0.2 %  Lipase, blood   Collection Time: 12/04/23  6:29 AM  Result Value Ref Range   Lipase 21 11 - 51 U/L  Magnesium   Collection Time: 12/04/23  6:29 AM  Result Value Ref Range   Magnesium 1.7 1.7 - 2.4 mg/dL  Urinalysis, Routine w reflex microscopic -Urine, Clean Catch   Collection Time: 12/04/23  8:46 AM  Result Value Ref Range   Color, Urine YELLOW YELLOW   APPearance CLEAR CLEAR   Specific Gravity, Urine 1.015 1.005 - 1.030   pH 8.0 5.0 - 8.0   Glucose, UA NEGATIVE NEGATIVE mg/dL   Hgb urine dipstick NEGATIVE NEGATIVE   Bilirubin  Urine NEGATIVE NEGATIVE   Ketones, ur 20 (A) NEGATIVE mg/dL   Protein, ur NEGATIVE NEGATIVE mg/dL   Nitrite NEGATIVE NEGATIVE   Leukocytes,Ua NEGATIVE NEGATIVE  CBC   Collection Time: 12/05/23  5:01 AM  Result Value Ref Range   WBC 10.0 4.0 - 10.5 K/uL   RBC 3.93 3.87 - 5.11 MIL/uL   Hemoglobin 11.1 (L) 12.0 - 15.0 g/dL   HCT 67.6 (L) 63.9 - 53.9 %   MCV 82.2 80.0 - 100.0 fL   MCH 28.2 26.0 - 34.0 pg   MCHC 34.4 30.0 - 36.0 g/dL   RDW 86.0 88.4 - 84.4 %   Platelets 282 150 - 400 K/uL   nRBC 0.0 0.0 - 0.2 %  Magnesium   Collection Time: 12/05/23  5:01 AM  Result Value Ref Range   Magnesium 1.7 1.7 - 2.4 mg/dL  Calcium   Collection Time: 12/05/23  5:01 AM  Result Value Ref Range   Calcium 9.1 8.9 - 10.3 mg/dL  Phosphorus   Collection Time: 12/05/23  5:01 AM  Result Value Ref Range   Phosphorus 3.6 2.5 - 4.6 mg/dL  TSH   Collection Time: 12/05/23  5:01 AM  Result Value Ref Range   TSH 0.249 (L) 0.350 - 4.500 uIU/mL  T4, free   Collection Time: 12/05/23  5:01 AM  Result Value Ref Range   Free T4 0.84 0.61 - 1.12 ng/dL  Potassium   Collection Time: 12/05/23  5:01 AM  Result Value Ref Range   Potassium 3.9 3.5 - 5.1 mmol/L  Glucose, capillary   Collection Time: 12/05/23  7:26 AM  Result Value Ref Range   Glucose-Capillary 93 70 - 99 mg/dL  Results for orders placed or performed during the hospital encounter of 11/29/23 (from the past week)  CBC with Differential/Platelet   Collection Time: 11/29/23  3:29 PM  Result Value Ref Range   WBC 5.4 4.0 - 10.5 K/uL   RBC 4.50 3.87 - 5.11 MIL/uL   Hemoglobin 12.6 12.0 - 15.0 g/dL   HCT 62.5 63.9 - 53.9 %   MCV 83.1 80.0 - 100.0 fL   MCH 28.0 26.0 - 34.0 pg   MCHC 33.7 30.0 - 36.0 g/dL   RDW 86.4 88.4 - 84.4 %   Platelets 302 150 - 400 K/uL   nRBC 0.0 0.0 - 0.2 %   Neutrophils Relative % 69 %   Neutro Abs 3.7 1.7 - 7.7 K/uL   Lymphocytes Relative 22 %   Lymphs Abs 1.2 0.7 - 4.0 K/uL   Monocytes Relative 8 %   Monocytes  Absolute 0.4 0.1 - 1.0 K/uL   Eosinophils Relative 0 %   Eosinophils Absolute 0.0 0.0 - 0.5 K/uL   Basophils Relative 0 %  Basophils Absolute 0.0 0.0 - 0.1 K/uL   Immature Granulocytes 1 %   Abs Immature Granulocytes 0.03 0.00 - 0.07 K/uL  Comprehensive metabolic panel   Collection Time: 11/29/23  3:29 PM  Result Value Ref Range   Sodium 134 (L) 135 - 145 mmol/L   Potassium 3.2 (L) 3.5 - 5.1 mmol/L   Chloride 102 98 - 111 mmol/L   CO2 18 (L) 22 - 32 mmol/L   Glucose, Bld 100 (H) 70 - 99 mg/dL   BUN <5 (L) 6 - 20 mg/dL   Creatinine, Ser 9.22 0.44 - 1.00 mg/dL   Calcium 9.2 8.9 - 89.6 mg/dL   Total Protein 7.4 6.5 - 8.1 g/dL   Albumin 3.3 (L) 3.5 - 5.0 g/dL   AST 18 15 - 41 U/L   ALT 17 0 - 44 U/L   Alkaline Phosphatase 41 38 - 126 U/L   Total Bilirubin 0.6 0.0 - 1.2 mg/dL   GFR, Estimated >39 >39 mL/min   Anion gap 14 5 - 15  Magnesium   Collection Time: 11/29/23  3:29 PM  Result Value Ref Range   Magnesium 1.5 (L) 1.7 - 2.4 mg/dL  Lipase, blood   Collection Time: 11/29/23  3:29 PM  Result Value Ref Range   Lipase 20 11 - 51 U/L  Type and screen [facility]   Collection Time: 11/29/23  4:55 PM  Result Value Ref Range   ABO/RH(D) A POS    Antibody Screen POS    Sample Expiration 12/02/2023,2359    Antibody Identification ANTI LEA (Lewis a) ANTI LEB (Lewis b)    Blood Bank Correction      PREVIOUSLY REPORTED AS: NO CLINICALLY SIGNIFICANT ANTIBODY IDENTIFIED RESULT MODIFIED ON: 11/29/23 2152 BY A WILSON, ORIGINAL ABI REPORTED AS NSAT. CORRECTED RESULT CALLED TO FLAVIA DART, RN   Unit Number [id number]    Blood Component Type RED CELLS,LR    Unit division 00    Status of Unit REL FROM [facility]    Transfusion Status OK TO TRANSFUSE    Crossmatch Result COMPATIBLE    Unit Number [id number]    Blood Component Type RBC LR PHER2    Unit division 00    Status of Unit REL FROM [facility]    Transfusion Status OK TO TRANSFUSE    Crossmatch Result  COMPATIBLE   BPAM RBC   Collection Time: 11/29/23  4:55 PM  Result Value Ref Range   Blood Product Unit Number [id number]    PRODUCT CODE [id number]    Unit Type and Rh 6200    Blood Product Expiration Date 797488947640    Blood Product Unit Number [id number]    PRODUCT CODE [id number]    Unit Type and Rh 6200    Blood Product Expiration Date [id number]   Prepare RBC (crossmatch)   Collection Time: 11/29/23  5:00 PM  Result Value Ref Range   Order Confirmation      ORDER PROCESSED BY BLOOD BANK Performed at [facility], [address] [place], [place] [address]   Urinalysis, Routine w reflex microscopic -Urine, Clean Catch   Collection Time: 11/29/23  6:15 PM  Result Value Ref Range   Color, Urine YELLOW YELLOW   APPearance CLEAR CLEAR   Specific Gravity, Urine 1.004 (L) 1.005 - 1.030   pH 7.0 5.0 - 8.0   Glucose, UA NEGATIVE NEGATIVE mg/dL   Hgb urine dipstick LARGE (A) NEGATIVE   Bilirubin Urine NEGATIVE NEGATIVE   Ketones, ur  20 (A) NEGATIVE mg/dL   Protein, ur 30 (A) NEGATIVE mg/dL   Nitrite NEGATIVE NEGATIVE   Leukocytes,Ua NEGATIVE NEGATIVE   RBC / HPF 11-20 0 - 5 RBC/hpf   WBC, UA 6-10 0 - 5 WBC/hpf   Bacteria, UA RARE (A) NONE SEEN   Squamous Epithelial / HPF 0-5 0 - 5 /HPF  Rapid urine drug screen (hospital performed)   Collection Time: 11/29/23  9:00 PM  Result Value Ref Range   Opiates POSITIVE (A) NONE DETECTED   Cocaine NONE DETECTED NONE DETECTED   Benzodiazepines NONE DETECTED NONE DETECTED   Amphetamines NONE DETECTED NONE DETECTED   Tetrahydrocannabinol POSITIVE (A) NONE DETECTED   Barbiturates NONE DETECTED NONE DETECTED  Comprehensive metabolic panel   Collection Time: 11/30/23  5:31 AM  Result Value Ref Range   Sodium 133 (L) 135 - 145 mmol/L   Potassium 4.4 3.5 - 5.1 mmol/L   Chloride 106 98 - 111 mmol/L   CO2 20 (L) 22 - 32 mmol/L   Glucose, Bld 126 (H) 70 - 99 mg/dL   BUN <5 (L) 6 - 20 mg/dL   Creatinine, Ser 9.35 0.44 - 1.00  mg/dL   Calcium 8.8 (L) 8.9 - 10.3 mg/dL   Total Protein 6.5 6.5 - 8.1 g/dL   Albumin 2.7 (L) 3.5 - 5.0 g/dL   AST 19 15 - 41 U/L   ALT 18 0 - 44 U/L   Alkaline Phosphatase 34 (L) 38 - 126 U/L   Total Bilirubin 0.4 0.0 - 1.2 mg/dL   GFR, Estimated >39 >39 mL/min   Anion gap 7 5 - 15  CBC   Collection Time: 11/30/23  5:31 AM  Result Value Ref Range   WBC 5.1 4.0 - 10.5 K/uL   RBC 3.90 3.87 - 5.11 MIL/uL   Hemoglobin 10.9 (L) 12.0 - 15.0 g/dL   HCT 67.6 (L) 63.9 - 53.9 %   MCV 82.8 80.0 - 100.0 fL   MCH 27.9 26.0 - 34.0 pg   MCHC 33.7 30.0 - 36.0 g/dL   RDW 86.3 88.4 - 84.4 %   Platelets 239 150 - 400 K/uL   nRBC 0.0 0.0 - 0.2 %  Magnesium   Collection Time: 11/30/23  5:31 AM  Result Value Ref Range   Magnesium 1.9 1.7 - 2.4 mg/dL   US  OB Comp Less 14 Wks Result Date: 11/29/2023 CLINICAL DATA:  Vaginal bleeding EXAM: OBSTETRIC <14 WK ULTRASOUND TECHNIQUE: Transabdominal ultrasound was performed for evaluation of the gestation as well as the maternal uterus and adnexal regions. COMPARISON:  None Available. FINDINGS: Intrauterine gestational sac: Single Yolk sac:  Visualized. Embryo:  Visualized. Cardiac Activity: Visualized. Heart Rate: 159 bpm MSD:    mm    w     d CRL:   39.5 mm   10 w 6 d                  US  EDC: 06/20/2024 Subchorionic hemorrhage:  Small subchorionic hemorrhage. Maternal uterus/adnexae: No adnexal mass or free fluid. IMPRESSION: Ten week 6 day intrauterine pregnancy. Fetal heart rate 159 beats per minute. Small subchorionic hemorrhage. Electronically Signed   By: Franky Crease M.D.   On: 11/29/2023 18:13    Future Appointments  Date Time Provider Department Center  12/09/2023 11:15 AM [facility] INTAKE [facility]  12/11/2023  9:15 AM Littie Olam LABOR, NP [facility]    Discharge Condition: Stable  Discharge disposition: 01-Home or Self Care  Discharge Instructions     Activity as tolerated - No restrictions   Complete by: As directed    Call  MD for:   Complete by: As directed    Decreased fetal movement, contractions, and vaginal bleeding.   Call MD for:  difficulty breathing, headache or visual disturbances   Complete by: As directed    Call MD for:  persistant nausea and vomiting   Complete by: As directed    Call MD for:  redness, tenderness, or signs of infection (pain, swelling, redness, odor or green/yellow discharge around incision site)   Complete by: As directed    Call MD for:  severe uncontrolled pain   Complete by: As directed    Call MD for:  temperature >100.4   Complete by: As directed    Diet general   Complete by: As directed    May shower / Bathe   Complete by: As directed       Allergies as of 12/05/2023       Reactions   Pollen Extract Itching        Medication List     STOP taking these medications    glycopyrrolate  2 MG tablet Commonly known as: ROBINUL        TAKE these medications    famotidine  20 MG tablet Commonly known as: Pepcid  Take 1 tablet (20 mg total) by mouth 2 (two) times daily as needed for heartburn or indigestion.   methylPREDNISolone  4 MG tablet Commonly known as: MEDROL  16 mg tid x 14 days, then 16 mg am, 8 mg midday, 16 mg hs, then 16 mg am 8 mg midday and hs, then 8 mg tid x 1 day, then 8 mg am, 4 mg midday, 8 mg hs, then 8 mg am, 4 mg midday, 4 mg hs, then 8 mg am, 4 mg midday x 2 days, then 8 mg q am x 2 days, then 4 mg q am x 2 days What changed: See the new instructions.   metoCLOPramide  10 MG tablet Commonly known as: REGLAN  Take 1 tablet (10 mg total) by mouth every 6 (six) hours.   ondansetron  4 MG disintegrating tablet Commonly known as: ZOFRAN -ODT Take 1-2 tablets (4-8 mg total) by mouth every 8 (eight) hours as needed for nausea, vomiting or refractory nausea / vomiting. What changed:  how much to take reasons to take this   polyethylene glycol powder 17 GM/SCOOP powder Commonly known as: GLYCOLAX /MIRALAX  Use once daily per package  instructions.   scopolamine  1 MG/3DAYS Commonly known as: TRANSDERM-SCOP Place 1 patch onto the skin every 3 (three) days.        Follow-up Information     [facility] Follow up.   Specialty: Obstetrics and Gynecology Contact information: [address] [place]  [address] [phone]                Total discharge time: 30 minutes   Signed: Vina Solian M.D. 12/05/2023, 11:27 AM

## 2023-12-08 ENCOUNTER — Other Ambulatory Visit: Payer: Self-pay

## 2023-12-08 ENCOUNTER — Other Ambulatory Visit: Payer: Self-pay | Admitting: Certified Nurse Midwife

## 2023-12-08 ENCOUNTER — Inpatient Hospital Stay (HOSPITAL_COMMUNITY)
Admission: AD | Admit: 2023-12-08 | Discharge: 2023-12-08 | Disposition: A | Discharge status: Home / Self Care | Payer: Self-pay | Attending: Obstetrics & Gynecology | Admitting: Obstetrics & Gynecology

## 2023-12-08 ENCOUNTER — Encounter: Payer: Self-pay | Admitting: Family Medicine

## 2023-12-08 ENCOUNTER — Other Ambulatory Visit (HOSPITAL_COMMUNITY): Payer: Self-pay

## 2023-12-08 ENCOUNTER — Encounter (HOSPITAL_COMMUNITY): Payer: Self-pay | Admitting: Obstetrics & Gynecology

## 2023-12-08 DIAGNOSIS — O210 Mild hyperemesis gravidarum: Secondary | ICD-10-CM | POA: Diagnosis present

## 2023-12-08 DIAGNOSIS — Z3A11 11 weeks gestation of pregnancy: Secondary | ICD-10-CM | POA: Diagnosis not present

## 2023-12-08 DIAGNOSIS — B9689 Other specified bacterial agents as the cause of diseases classified elsewhere: Secondary | ICD-10-CM | POA: Diagnosis not present

## 2023-12-08 DIAGNOSIS — O23591 Infection of other part of genital tract in pregnancy, first trimester: Secondary | ICD-10-CM | POA: Insufficient documentation

## 2023-12-08 LAB — URINALYSIS, ROUTINE W REFLEX MICROSCOPIC
Bilirubin Urine: NEGATIVE
Glucose, UA: NEGATIVE mg/dL
Hgb urine dipstick: NEGATIVE
Ketones, ur: 80 mg/dL — AB
Nitrite: NEGATIVE
Protein, ur: 30 mg/dL — AB
Specific Gravity, Urine: 1.025 (ref 1.005–1.030)
pH: 6 (ref 5.0–8.0)

## 2023-12-08 LAB — WET PREP, GENITAL
Sperm: NONE SEEN
Trich, Wet Prep: NONE SEEN
WBC, Wet Prep HPF POC: <10 (ref <10)
Yeast Wet Prep HPF POC: NONE SEEN

## 2023-12-08 LAB — CBC WITH DIFFERENTIAL/PLATELET
Abs Immature Granulocytes: 0.05 K/uL (ref 0.00–0.07)
Basophils Absolute: 0 K/uL (ref 0.0–0.1)
Basophils Relative: 0 %
Eosinophils Absolute: 0 K/uL (ref 0.0–0.5)
Eosinophils Relative: 1 %
HCT: 39.6 % (ref 36.0–46.0)
Hemoglobin: 13.4 g/dL (ref 12.0–15.0)
Immature Granulocytes: 1 %
Lymphocytes Relative: 20 %
Lymphs Abs: 1.5 K/uL (ref 0.7–4.0)
MCH: 28.2 pg (ref 26.0–34.0)
MCHC: 33.8 g/dL (ref 30.0–36.0)
MCV: 83.4 fL (ref 80.0–100.0)
Monocytes Absolute: 0.7 K/uL (ref 0.1–1.0)
Monocytes Relative: 10 %
Neutro Abs: 5.3 K/uL (ref 1.7–7.7)
Neutrophils Relative %: 68 %
Platelets: 343 K/uL (ref 150–400)
RBC: 4.75 MIL/uL (ref 3.87–5.11)
RDW: 13.9 % (ref 11.5–15.5)
WBC: 7.6 K/uL (ref 4.0–10.5)
nRBC: 0 % (ref 0.0–0.2)

## 2023-12-08 LAB — COMPREHENSIVE METABOLIC PANEL WITH GFR
ALT: 21 U/L (ref 0–44)
AST: 15 U/L (ref 15–41)
Albumin: 3.2 g/dL — ABNORMAL LOW (ref 3.5–5.0)
Alkaline Phosphatase: 43 U/L (ref 38–126)
Anion gap: 13 (ref 5–15)
BUN: 7 mg/dL (ref 6–20)
CO2: 20 mmol/L — ABNORMAL LOW (ref 22–32)
Calcium: 9.2 mg/dL (ref 8.9–10.3)
Chloride: 101 mmol/L (ref 98–111)
Creatinine, Ser: 0.84 mg/dL (ref 0.44–1.00)
GFR, Estimated: >60 mL/min (ref >60)
Glucose, Bld: 96 mg/dL (ref 70–99)
Potassium: 3.5 mmol/L (ref 3.5–5.1)
Sodium: 134 mmol/L — ABNORMAL LOW (ref 135–145)
Total Bilirubin: 0.6 mg/dL (ref 0.0–1.2)
Total Protein: 7.1 g/dL (ref 6.5–8.1)

## 2023-12-08 MED ORDER — METHYLPREDNISOLONE 16 MG PO TABS: 16.0000 mg | ORAL_TABLET | Freq: Every day | ORAL | Status: DC

## 2023-12-08 MED ORDER — LACTATED RINGERS IV SOLN: INTRAVENOUS | Status: DC

## 2023-12-08 MED ORDER — ONDANSETRON 4 MG PO TBDP
8.0000 mg | ORAL_TABLET | Freq: Once | ORAL | Status: AC
  Administered 2023-12-08: 8 mg via ORAL
  Filled 2023-12-08: qty 2

## 2023-12-08 MED ORDER — METHYLPREDNISOLONE 4 MG PO TABS: 4.0000 mg | ORAL_TABLET | Freq: Every day | ORAL | Status: DC

## 2023-12-08 MED ORDER — LACTATED RINGERS IV BOLUS
1000.0000 mL | Freq: Once | INTRAVENOUS | Status: AC
  Administered 2023-12-08: 1000 mL via INTRAVENOUS

## 2023-12-08 MED ORDER — MIRTAZAPINE 15 MG PO TBDP: 15.0000 mg | ORAL_TABLET | ORAL | Status: DC

## 2023-12-08 MED ORDER — METHYLPREDNISOLONE 4 MG PO TABS: 8.0000 mg | ORAL_TABLET | Freq: Every day | ORAL | Status: DC

## 2023-12-08 MED ORDER — SODIUM CHLORIDE 0.9 % IV SOLN
25.0000 mg | Freq: Once | INTRAVENOUS | Status: AC
  Administered 2023-12-08: 25 mg via INTRAVENOUS
  Filled 2023-12-08: qty 1

## 2023-12-08 MED ORDER — METRONIDAZOLE 0.75 % VA GEL
1.0000 | Freq: Every day | VAGINAL | 1 refills | Status: DC
  Filled 2023-12-08: qty 70, 7d supply, fill #0

## 2023-12-08 MED ORDER — METHYLPREDNISOLONE SODIUM SUCC 125 MG IJ SOLR: 48.0000 mg | Freq: Once | INTRAMUSCULAR | Status: DC

## 2023-12-08 MED ORDER — SODIUM CHLORIDE 0.9 % IV SOLN
25.0000 mg | Freq: Four times a day (QID) | INTRAVENOUS | Status: DC | PRN
  Administered 2023-12-08: 25 mg via INTRAVENOUS
  Filled 2023-12-08: qty 1

## 2023-12-08 MED ORDER — METHYLPREDNISOLONE SODIUM SUCC 125 MG IJ SOLR
48.0000 mg | Freq: Once | INTRAMUSCULAR | Status: AC
  Administered 2023-12-08: 48 mg via INTRAVENOUS
  Filled 2023-12-08: qty 2

## 2023-12-08 MED ORDER — METOCLOPRAMIDE HCL 10 MG PO TABS
10.0000 mg | ORAL_TABLET | Freq: Once | ORAL | Status: AC
  Administered 2023-12-08: 10 mg via ORAL
  Filled 2023-12-08: qty 1

## 2023-12-08 NOTE — MAU Provider Note (Signed)
 Chief Complaint:  Nausea and Emesis   HPI   None     Amy Downs is a 28 y.o. [id number] at [redacted]w[redacted]d who presents to maternity admissions reporting continued vomiting. She took Reglan  and Zofran  this morning and is wearing her Scopolamine  patch. She has had several visits to MAU this pregnancy, and has lost 27 lbs since start of pregnancy.   She has been receiving outpatient infusions for hyperemesis of pregnancy, the order lapsed and she hasn't been able to continue. Patient with admissions x2 now during this pregnancy. She reports she was unable to pick up her prescription for prednisone  due to pharmacy being out of stock since her discharge from hospital. She has had haldol , steroids, phenergan , reglan , zofran  all to varying degrees of success.   She additionally reports some vaginal spotting after using an enema for constipation. She reports the bleeding was definitely vaginal, was light pink when wiping.   Pregnancy Course: Hyperemesis  Past Medical History:  Diagnosis Date   Abscess    Anxiety    no meds   HSV-2 infection    UTI (urinary tract infection)    OB History  Gravida Para Term Preterm AB Living  3 2 1 1  3   SAB IAB Ectopic Multiple Live Births     1 3    # Outcome Date GA Lbr Len/2nd Weight Sex Type Anes PTL Lv  3 Current           2A Preterm  [redacted]w[redacted]d    CS-LTranv   LIV  2B Preterm      CS-LTranv   LIV  1 Term  [redacted]w[redacted]d    VBAC   LIV   Past Surgical History:  Procedure Laterality Date   CESAREAN SECTION     twin IUP with PreE   HERNIA REPAIR     WISDOM TOOTH EXTRACTION     Family History  Problem Relation Age of Onset   Stroke Mother 23   Heart disease Father    Social History   Tobacco Use   Smoking status: Never   Smokeless tobacco: Never  Vaping Use   Vaping status: Never Used  Substance Use Topics   Alcohol use: No   Drug use: Not Currently    Types: Marijuana    Comment: wk she found was last   Allergies  Allergen Reactions   Pollen Extract  Itching   No medications prior to admission.    I have reviewed patient's Past Medical Hx, Surgical Hx, Family Hx, Social Hx, medications and allergies.   ROS  Pertinent items noted in HPI and remainder of comprehensive ROS otherwise negative.   PHYSICAL EXAM  Patient Vitals for the past 24 hrs:  BP Temp Temp src Pulse Resp SpO2 Height Weight  12/08/23 1431 (!) 111/54 -- -- 95 -- -- -- --  12/08/23 0911 119/77 -- -- (!) 102 -- -- -- --  12/08/23 0857 139/68 98 F (36.7 C) Oral (!) 115 20 100 % -- --  12/08/23 0852 -- -- -- -- -- -- 5' 9 (1.753 m) 114.4 kg    Physical Exam Vitals and nursing note reviewed.  Constitutional:      Appearance: Normal appearance.  HENT:     Head: Normocephalic.  Cardiovascular:     Rate and Rhythm: Normal rate.     Pulses: Normal pulses.  Pulmonary:     Effort: Pulmonary effort is normal.  Skin:    General: Skin is warm and dry.  Capillary Refill: Capillary refill takes less than 2 seconds.  Neurological:     General: No focal deficit present.     Mental Status: She is alert and oriented to person, place, and time.  Psychiatric:        Mood and Affect: Mood normal.        Behavior: Behavior normal.        Thought Content: Thought content normal.        Judgment: Judgment normal.         FHTs: 150s  Labs: Results for orders placed or performed during the hospital encounter of 12/08/23 (from the past 24 hours)  Urinalysis, Routine w reflex microscopic -Urine, Clean Catch     Status: Abnormal   Collection Time: 12/08/23  9:08 AM  Result Value Ref Range   Color, Urine AMBER (A) YELLOW   APPearance HAZY (A) CLEAR   Specific Gravity, Urine 1.025 1.005 - 1.030   pH 6.0 5.0 - 8.0   Glucose, UA NEGATIVE NEGATIVE mg/dL   Hgb urine dipstick NEGATIVE NEGATIVE   Bilirubin Urine NEGATIVE NEGATIVE   Ketones, ur 80 (A) NEGATIVE mg/dL   Protein, ur 30 (A) NEGATIVE mg/dL   Nitrite NEGATIVE NEGATIVE   Leukocytes,Ua TRACE (A) NEGATIVE   RBC  / HPF 0-5 0 - 5 RBC/hpf   WBC, UA 6-10 0 - 5 WBC/hpf   Bacteria, UA RARE (A) NONE SEEN   Squamous Epithelial / HPF 6-10 0 - 5 /HPF   Mucus PRESENT   CBC with Differential/Platelet     Status: None   Collection Time: 12/08/23  9:22 AM  Result Value Ref Range   WBC 7.6 4.0 - 10.5 K/uL   RBC 4.75 3.87 - 5.11 MIL/uL   Hemoglobin 13.4 12.0 - 15.0 g/dL   HCT 60.3 63.9 - 53.9 %   MCV 83.4 80.0 - 100.0 fL   MCH 28.2 26.0 - 34.0 pg   MCHC 33.8 30.0 - 36.0 g/dL   RDW 86.0 88.4 - 84.4 %   Platelets 343 150 - 400 K/uL   nRBC 0.0 0.0 - 0.2 %   Neutrophils Relative % 68 %   Neutro Abs 5.3 1.7 - 7.7 K/uL   Lymphocytes Relative 20 %   Lymphs Abs 1.5 0.7 - 4.0 K/uL   Monocytes Relative 10 %   Monocytes Absolute 0.7 0.1 - 1.0 K/uL   Eosinophils Relative 1 %   Eosinophils Absolute 0.0 0.0 - 0.5 K/uL   Basophils Relative 0 %   Basophils Absolute 0.0 0.0 - 0.1 K/uL   Immature Granulocytes 1 %   Abs Immature Granulocytes 0.05 0.00 - 0.07 K/uL  Comprehensive metabolic panel     Status: Abnormal   Collection Time: 12/08/23  9:22 AM  Result Value Ref Range   Sodium 134 (L) 135 - 145 mmol/L   Potassium 3.5 3.5 - 5.1 mmol/L   Chloride 101 98 - 111 mmol/L   CO2 20 (L) 22 - 32 mmol/L   Glucose, Bld 96 70 - 99 mg/dL   BUN 7 6 - 20 mg/dL   Creatinine, Ser 9.15 0.44 - 1.00 mg/dL   Calcium 9.2 8.9 - 89.6 mg/dL   Total Protein 7.1 6.5 - 8.1 g/dL   Albumin 3.2 (L) 3.5 - 5.0 g/dL   AST 15 15 - 41 U/L   ALT 21 0 - 44 U/L   Alkaline Phosphatase 43 38 - 126 U/L   Total Bilirubin 0.6 0.0 - 1.2 mg/dL   GFR,  Estimated >60 >60 mL/min   Anion gap 13 5 - 15  Wet prep, genital     Status: Abnormal   Collection Time: 12/08/23  9:52 AM   Specimen: Vaginal  Result Value Ref Range   Yeast Wet Prep HPF POC NONE SEEN NONE SEEN   Trich, Wet Prep NONE SEEN NONE SEEN   Clue Cells Wet Prep HPF POC PRESENT (A) NONE SEEN   WBC, Wet Prep HPF POC <10 <10   Sperm NONE SEEN     Imaging:  No results found.  MDM &  MAU COURSE  MDM: Moderate  MAU Course: Orders Placed This Encounter  Procedures   Wet prep, genital   Urinalysis, Routine w reflex microscopic -Urine, Clean Catch   CBC with Differential/Platelet   Comprehensive metabolic panel   methylPREDNISolone  (MEDROL ) taper for hyperemesis gravidarum per pharmacy consult   Insert peripheral IV   Discharge patient   Meds ordered this encounter  Medications   lactated ringers  bolus 1,000 mL   promethazine  (PHENERGAN ) 25 mg in sodium chloride  0.9 % 50 mL IVPB   methylPREDNISolone  sodium succinate (SOLU-MEDROL ) 125 mg/2 mL injection 48 mg    IV methylprednisolone  will be converted to either a q12h or q24h frequency with the same total daily dose (TDD).  Ordered Dose: 1 to 125 mg TDD; convert to: TDD q24h.  Ordered Dose: 126 to 250 mg TDD; convert to: TDD div q12h.  Ordered Dose: >250 mg TDD; DAW.   DISCONTD: mirtazapine (REMERON SOL-TAB) disintegrating tablet 15 mg   chlorproMAZINE (THORAZINE) 25 mg in sodium chloride  0.9 % 25 mL IVPB   lactated ringers  infusion   metoCLOPramide  (REGLAN ) tablet 10 mg   ondansetron  (ZOFRAN -ODT) disintegrating tablet 8 mg   DISCONTD: methylPREDNISolone  (MEDROL ) tablet 16 mg   DISCONTD: methylPREDNISolone  (MEDROL ) tablet 8 mg   DISCONTD: methylPREDNISolone  (MEDROL ) tablet 4 mg   DISCONTD: methylPREDNISolone  (MEDROL ) tablet 16 mg   DISCONTD: methylPREDNISolone  (MEDROL ) tablet 8 mg   DISCONTD: methylPREDNISolone  (MEDROL ) tablet 4 mg   DISCONTD: methylPREDNISolone  (MEDROL ) tablet 16 mg   DISCONTD: methylPREDNISolone  (MEDROL ) tablet 8 mg   DISCONTD: methylPREDNISolone  (MEDROL ) tablet 4 mg   DISCONTD: methylPREDNISolone  sodium succinate (SOLU-MEDROL ) 125 mg/2 mL injection 48 mg    IV methylprednisolone  will be converted to either a q12h or q24h frequency with the same total daily dose (TDD).  Ordered Dose: 1 to 125 mg TDD; convert to: TDD q24h.  Ordered Dose: 126 to 250 mg TDD; convert to: TDD div q12h.  Ordered  Dose: >250 mg TDD; DAW.   metroNIDAZOLE  (METROGEL ) 0.75 % vaginal gel    Sig: Place 1 Applicatorful vaginally at bedtime. Apply one applicatorful to vagina at bedtime for 5 days    Dispense:  70 g    Refill:  1    Supervising Provider:   MARILYNN NEST [[id number]]  IV fluids for hydration, trial of phenergan  alleviated active vomiting, but nausea continued.  Reassessment (10:17 AM) As nausea unrelieved by phenergan , will trial solumedrol 48mg  by IV push.   Patient reports nausea still unrelieved following phenergan . Will trial Thorazine 25mg  IV. Good relief and then was time for scheduled medications, given.   ASSESSMENT   1. Hyperemesis gravidarum   2. Hyperemesis arising during pregnancy   3. Bacterial vaginosis   4. [redacted] weeks gestation of pregnancy     PLAN  Discharge home in stable condition.  Reordered outpatient IV infusions for 3x weekly.  Prednisone  taper already provided to patient on last discharge. Patient may  start taking now as she was unable to retrieve medications from [facility].  Metrogel  70g PV nightly for 5 nights for bacterial vaginosis.   Follow-up Information     [facility] Follow up.   Specialty: Infusion Therapy Contact information: [address] [place]  [address] [phone]                Allergies as of 12/08/2023       Reactions   Pollen Extract Itching        Medication List     TAKE these medications    famotidine  20 MG tablet Commonly known as: Pepcid  Take 1 tablet (20 mg total) by mouth 2 (two) times daily as needed for heartburn or indigestion.   methylPREDNISolone  16 MG tablet Commonly known as: Medrol  Take 1 tablet (16 mg total) by mouth in the morning, at noon, and at bedtime for 14 days. THEN start tapering per medication directions.   methylPREDNISolone  4 MG tablet Commonly known as: Medrol  Take 4 tablets by mouth (16 mg total) in the morning AND bedtime  AND 2 tablets (8 mg total) at midday for 1 day. THEN 4 tablets (16 mg total) in the morning AND 2 tablets (8 mg total) at midday and bedtime for 1 day. THEN 2 tablets (8 mg total) 3 (three) times daily for 1 day. THEN 2 tablets (8 mg total) morning AND bedtime AND 1 tablet (4 mg total) midday for 1 day. THEN 2 tablets (8 mg total) morning AND 1 tablet (4 mg total) midday and bedtime for 1 day. THEN 2 tablets (8 mg total) morning AND 1 tablet (4 mg total) midday for 2 days. THEN 2 tablets (8 mg total) morning for 2 days. THEN 1 tablet (4 mg total) morning for 2 days.   methylPREDNISolone  16 MG tablet Commonly known as: Medrol  Take 1 tablet (16 mg total) by mouth in the morning, at noon, and at bedtime for 14 days. Take this for two weeks and then begin to taper as prescribed.   metoCLOPramide  10 MG tablet Commonly known as: REGLAN  Take 1 tablet (10 mg total) by mouth every 6 (six) hours.   metroNIDAZOLE  0.75 % vaginal gel Commonly known as: METROGEL  Place 1 Applicatorful vaginally at bedtime. Apply one applicatorful to vagina at bedtime for 5 days   ondansetron  4 MG disintegrating tablet Commonly known as: ZOFRAN -ODT Take 1-2 tablets (4-8 mg total) by mouth every 8 (eight) hours as needed for nausea, vomiting or refractory nausea / vomiting.   polyethylene glycol powder 17 GM/SCOOP powder Commonly known as: GLYCOLAX /MIRALAX  Use once daily per package instructions.   scopolamine  1 MG/3DAYS Commonly known as: TRANSDERM-SCOP Place 1 patch onto the skin every 3 (three) days.        Camie Rote, MSN, CNM 12/08/2023 3:36 PM  Certified Nurse Midwife, [facility]

## 2023-12-08 NOTE — MAU Note (Signed)
 Amy Downs is a 28 y.o. at [redacted]w[redacted]d here in MAU reporting: she's continue to vomit despite taking Reglan  & Zofran .  States supposed to be taking steroids but not available at pharmacy.  Reports has vomited over 15 times in past 24 hours, unable to keep anything down.  Reports a little cramping and had spotting yesterday, none today.  LMP: 09/22/2023 Onset of complaint: yesterday Pain score: 5 Vitals:   12/08/23 0857  BP: 139/68  Pulse: (!) 115  Resp: 20  Temp: 98 F (36.7 C)  SpO2: 100%     FHT:   Lab orders placed from triage: UA

## 2023-12-08 NOTE — Discharge Instructions (Signed)
 [name],  I hope you feel better soon. It was a pleasure meeting you in MAU today. The [facility] should reach out to you to get you scheduled for infusions. You may take the steroids (methylprednisolone ) that was prescribed for you. Taper it down per instructions.  Thank you for trusting us  to care for you, Camie, Midwife

## 2023-12-09 ENCOUNTER — Telehealth

## 2023-12-09 DIAGNOSIS — Z3A11 11 weeks gestation of pregnancy: Secondary | ICD-10-CM | POA: Diagnosis not present

## 2023-12-09 DIAGNOSIS — O0991 Supervision of high risk pregnancy, unspecified, first trimester: Secondary | ICD-10-CM

## 2023-12-09 DIAGNOSIS — O0990 Supervision of high risk pregnancy, unspecified, unspecified trimester: Secondary | ICD-10-CM | POA: Insufficient documentation

## 2023-12-09 LAB — GC/CHLAMYDIA PROBE AMP (~~LOC~~) NOT AT ARMC
Chlamydia: NEGATIVE
Comment: NEGATIVE
Comment: NORMAL
Neisseria Gonorrhea: NEGATIVE

## 2023-12-09 MED ORDER — BLOOD PRESSURE KIT DEVI: 1.0000 | Freq: Once | 0 refills | Status: AC

## 2023-12-09 MED ORDER — GOJJI WEIGHT SCALE MISC: 1.0000 | Freq: Once | 0 refills | Status: AC

## 2023-12-09 NOTE — Patient Instructions (Addendum)
 [facility]  [address], [place], [place] [address] [phone] Hours: Sunday Closed Monday 9AM-6PM Tuesday 9AM-6PM Wednesday 9AM-6PM Thursday 9AM-6PM Friday           9AM-6PM Saturday         10AM-1PM     MEDROL (METHYLPREDNISOLONE) TAPER  Date Day Morning Midday Bedtime   1-14 16 mg 16 mg 16 mg   15 16 mg 8 mg 16 mg   16 16 mg 8 mg 8 mg   17 8 mg 8 mg 8 mg   18 8 mg 4 mg 8 mg   19 8 mg 4 mg 4 mg   20 8 mg 4 mg     21 8 mg 4 mg     22 8 mg       23 8 mg       24 4 mg       25  4 mg

## 2023-12-09 NOTE — Progress Notes (Signed)
 New OB Intake  I connected with Tobie Angelino  on 12/09/23 at 11:15 AM EDT by MyChart Video Visit and verified that I am speaking with the correct person using two identifiers. Nurse is located at [facility] and pt is located at home.  I discussed the limitations, risks, security and privacy concerns of performing an evaluation and management service by telephone and the availability of in person appointments. I also discussed with the patient that there may be a patient responsible charge related to this service. The patient expressed understanding and agreed to proceed.  I explained I am completing New OB Intake today. We discussed EDD of 06/28/24 based on LMP c/w US  at 6w. Pt is [id number]. I reviewed her allergies, medications and Medical/Surgical/OB history.    Patient Active Problem List   Diagnosis Date Noted   Hyperemesis gravidarum 11/29/2023   History of severe pre-eclampsia 11/10/2023   History of hyperemesis gravidarum 11/10/2023   Genital herpes simplex 07/08/2016   History of C-section 01/04/2015    Concerns addressed today HSV: Patient reports when she was younger a friend did not have medical insurance or access to care so she told her provider she needed HSV treatment. This has been on her medical record since. She would like to have this removed. Hyperemesis: Taking medications as prescribed. Vomited x 1 today, which is a big improvement. Plans to begin steroid today. Reviewed steroid taper beginning with Medrol  16 mg TID x 14 days. That prescription unable to be filled at hospital pharmacy, plans to pick up today. [facility] coverage, they are only able to fill with 4 mg tablets due to insurance, pharmacy to give instructions about correct dosing. Plans to resume 3x/week IV infusions-- awaiting scheduling at [facility].  Delivery Plans Plans to deliver at [facility]. Discussed the nature of our practice with multiple providers including residents and  students as well as female and female providers. Due to the size of the practice, the delivering provider may not be the same as those providing prenatal care. Patient interested in water birth; explained she is not a candidate due to prior c-section.  MyChart/Babyscripts MyChart access verified. I explained pt will have some visits in office and some virtually. Babyscripts instructions given and order placed.   Blood Pressure Cuff/Weight Scale Blood pressure cuff ordered for patient to pick-up from [facility]. Explained after first prenatal appt pt will check weekly and document in Babyscripts. Patient does not have weight scale; order sent to [facility], patient may track weight weekly in Babyscripts.  Anatomy US  Explained first scheduled US  will be around 19 weeks. Message sent to MFM to schedule.  Is patient a CenteringPregnancy candidate?  Declined Declined due to school schedule, needs Friday appointments  Is patient a Mom+Baby Combined Care candidate?  Not a candidate    Is patient a candidate for Babyscripts Optimization? Declined, needs more frequent appointments for reassurance.   First visit review I reviewed new OB appt with patient. Explained pt will be seen by Olam Dalton, NP at first visit. Discussed [place] genetic screening with patient; desires [facility] and [facility]. Routine prenatal labs with CMP + Pr/Cr Ratio. No PAP record, will need completed at new OB.   Vernell FORBES Ruddle, RN 12/09/2023  11:21 AM

## 2023-12-10 ENCOUNTER — Other Ambulatory Visit (HOSPITAL_COMMUNITY): Payer: Self-pay | Admitting: Certified Nurse Midwife

## 2023-12-10 DIAGNOSIS — E860 Dehydration: Secondary | ICD-10-CM | POA: Insufficient documentation

## 2023-12-11 ENCOUNTER — Ambulatory Visit (INDEPENDENT_AMBULATORY_CARE_PROVIDER_SITE_OTHER): Admitting: Nurse Practitioner

## 2023-12-11 ENCOUNTER — Other Ambulatory Visit: Payer: Self-pay

## 2023-12-11 ENCOUNTER — Other Ambulatory Visit (HOSPITAL_COMMUNITY)
Admission: RE | Admit: 2023-12-11 | Discharge: 2023-12-11 | Disposition: A | Discharge status: Home / Self Care | Source: Ambulatory Visit | Attending: Nurse Practitioner | Admitting: Nurse Practitioner

## 2023-12-11 VITALS — BP 103/77 | HR 81 | Wt 255.0 lb

## 2023-12-11 DIAGNOSIS — N898 Other specified noninflammatory disorders of vagina: Secondary | ICD-10-CM | POA: Insufficient documentation

## 2023-12-11 DIAGNOSIS — Z8759 Personal history of other complications of pregnancy, childbirth and the puerperium: Secondary | ICD-10-CM

## 2023-12-11 DIAGNOSIS — O26891 Other specified pregnancy related conditions, first trimester: Secondary | ICD-10-CM | POA: Insufficient documentation

## 2023-12-11 DIAGNOSIS — O0990 Supervision of high risk pregnancy, unspecified, unspecified trimester: Secondary | ICD-10-CM

## 2023-12-11 DIAGNOSIS — Z3A11 11 weeks gestation of pregnancy: Secondary | ICD-10-CM | POA: Diagnosis not present

## 2023-12-11 DIAGNOSIS — Z3481 Encounter for supervision of other normal pregnancy, first trimester: Secondary | ICD-10-CM

## 2023-12-11 DIAGNOSIS — O210 Mild hyperemesis gravidarum: Secondary | ICD-10-CM | POA: Diagnosis not present

## 2023-12-11 DIAGNOSIS — Z124 Encounter for screening for malignant neoplasm of cervix: Secondary | ICD-10-CM

## 2023-12-11 DIAGNOSIS — Z31430 Encounter of female for testing for genetic disease carrier status for procreative management: Secondary | ICD-10-CM

## 2023-12-11 DIAGNOSIS — O0991 Supervision of high risk pregnancy, unspecified, first trimester: Secondary | ICD-10-CM | POA: Diagnosis not present

## 2023-12-11 NOTE — Progress Notes (Signed)
 History:   Amy Downs is a 28 y.o. [id number] at [redacted]w[redacted]d by LMP being seen today for her first obstetrical visit.  Her obstetrical history is significant for obesity and history of PreE with SF in twin pregnancy , H/O C- Section with successful VBAC  and Hyperemesis  . Patient unknown if   intend to breast feed. Pregnancy history fully reviewed.  Patient reports fatigue, heartburn, nausea, and vomiting.      HISTORY: OB History  Gravida Para Term Preterm AB Living  3 2 1 1  0 3  SAB IAB Ectopic Multiple Live Births  0 0 0 1 3    # Outcome Date GA Lbr Len/2nd Weight Sex Type Anes PTL Lv  3 Current           2 Term 08/20/15 [redacted]w[redacted]d 04:10 / 00:23 7 lb 1.6 oz (3.22 kg) M VBAC EPI N LIV     Name: Finazzo,NBM Usha     Apgar1: 8  Apgar5: 9  1A Preterm 03/28/14 [redacted]w[redacted]d  6 lb 0.3 oz (2.73 kg) M CS-LTranv Spinal N LIV     Apgar1: 8  Apgar5: 9  1B Preterm 03/28/14 [redacted]w[redacted]d  4 lb 7.6 oz (2.03 kg) M CS-LTranv Spinal N LIV     Apgar1: 8  Apgar5: 9    Last pap smear was done today   Past Medical History:  Diagnosis Date   Abscess    Anxiety    no meds   HSV-2 infection    UTI (urinary tract infection)    Past Surgical History:  Procedure Laterality Date   CESAREAN SECTION     twin IUP with PreE   HERNIA REPAIR     WISDOM TOOTH EXTRACTION     Family History  Problem Relation Age of Onset   Stroke Mother 6   Heart disease Father    Hypertension Maternal Grandmother    COPD Maternal Grandmother    Kidney disease Paternal Grandmother    Social History   Tobacco Use   Smoking status: Never   Smokeless tobacco: Never  Vaping Use   Vaping status: Never Used  Substance Use Topics   Alcohol use: Not Currently   Drug use: Not Currently    Types: Marijuana   Allergies  Allergen Reactions   Pollen Extract Itching   Current Outpatient Medications on File Prior to Visit  Medication Sig Dispense Refill   famotidine  (PEPCID ) 20 MG tablet Take 1 tablet (20 mg total) by mouth 2 (two)  times daily as needed for heartburn or indigestion. 60 tablet 0   methylPREDNISolone  (MEDROL ) 16 MG tablet Take 1 tablet (16 mg total) by mouth in the morning, at noon, and at bedtime for 14 days. THEN start tapering per medication directions. 42 tablet 0   metoCLOPramide  (REGLAN ) 10 MG tablet Take 1 tablet (10 mg total) by mouth every 6 (six) hours. 60 tablet 0   metroNIDAZOLE  (METROGEL ) 0.75 % vaginal gel Place 1 Applicatorful vaginally at bedtime. Apply one applicatorful to vagina at bedtime for 5 days 70 g 1   ondansetron  (ZOFRAN -ODT) 4 MG disintegrating tablet Take 1-2 tablets (4-8 mg total) by mouth every 8 (eight) hours as needed for nausea, vomiting or refractory nausea / vomiting. 30 tablet 0   methylPREDNISolone  (MEDROL ) 16 MG tablet Take 1 tablet (16 mg total) by mouth in the morning, at noon, and at bedtime for 14 days. Take this for two weeks and then begin to taper as prescribed. 42 tablet 0  methylPREDNISolone  (MEDROL ) 4 MG tablet Take 4 tablets by mouth (16 mg total) in the morning AND bedtime AND 2 tablets (8 mg total) at midday for 1 day. THEN 4 tablets (16 mg total) in the morning AND 2 tablets (8 mg total) at midday and bedtime for 1 day. THEN 2 tablets (8 mg total) 3 (three) times daily for 1 day. THEN 2 tablets (8 mg total) morning AND bedtime AND 1 tablet (4 mg total) midday for 1 day. THEN 2 tablets (8 mg total) morning AND 1 tablet (4 mg total) midday and bedtime for 1 day. THEN 2 tablets (8 mg total) morning AND 1 tablet (4 mg total) midday for 2 days. THEN 2 tablets (8 mg total) morning for 2 days. THEN 1 tablet (4 mg total) morning for 2 days. 45 tablet 0   polyethylene glycol powder (GLYCOLAX /MIRALAX ) 17 GM/SCOOP powder Use once daily per package instructions. (Patient not taking: Reported on 12/11/2023) 850 g 11   scopolamine  (TRANSDERM-SCOP) 1 MG/3DAYS Place 1 patch onto the skin every 3 (three) days.     No current facility-administered medications on file prior to visit.     Review of Systems Pertinent items noted in HPI and remainder of comprehensive ROS otherwise negative. Physical Exam:   Vitals:   12/11/23 0928  BP: 103/77  Pulse: 81  Weight: 255 lb (115.7 kg)   Fetal Heart Rate (bpm): 152  Constitutional: Well-developed, well-nourished pregnant female in no acute distress.  HEENT: PERRLA Skin: normal color and turgor, no rash Cardiovascular: normal rate & rhythm, warm and well perfused Respiratory: normal effort, no problems with respiration noted GI: Abd soft, NT MS: Extremities nontender, no edema, normal ROM Neurologic: Alert and oriented x 4.  GU: no CVA tenderness Pelvic: NEFG, physiologic discharge, no blood, cervix clean. Pap/swabs collected    Assessment:    Pregnancy: [id number] Patient Active Problem List   Diagnosis Date Noted   Dehydration 12/10/2023   Supervision of high risk pregnancy, antepartum 12/09/2023   Hyperemesis gravidarum 11/29/2023   History of severe pre-eclampsia 11/10/2023   History of hyperemesis gravidarum 11/10/2023   History of C-section 01/04/2015     Plan:   1. Supervision of high risk pregnancy, antepartum (Primary)  - CBC/D/Plt+RPR+Rh+ABO+RubIgG... - Comp Met (CMET) - Protein / creatinine ratio, urine - Hemoglobin A1c  2. Hyperemesis arising during pregnancy - Reports feeling stable today and better overall with N/V - Numerous MAU visits and weight loss  - Continue IV Infusions as needed  - On medrol  Taper  - Antiemetics prn - Comp Met (CMET)  3. [redacted] weeks gestation of pregnancy - CBC/D/Plt+RPR+Rh+ABO+RubIgG... - Comp Met (CMET)  4. Encounter for supervision of other normal pregnancy in first trimester  - PANORAMA PRENATAL TEST  5. Encounter of female for testing for genetic disease carrier status for procreative management  - HORIZON Basic Panel  6. Vaginal discharge during pregnancy in first trimester  - Cervicovaginal ancillary only( [facility])  7. Encounter for  Papanicolaou smear for cervical cancer screening  - Cytology - PAP( [facility])  8. History of severe pre-eclampsia - ASA (12-16 weeks) - Monitor BP's  - Consider Baby Scripts  - BP Cuff    The patient has the following indications for aspirinto begin 81 mg at 12-16 weeks: One high risk condition: History of preeclampsia MORE than one moderate risk condition: obesity, low SES  , and identifies as African American  Aspirin was  recommended today based upon above risk factors (one high  risk condition or more than one moderate risk factor)    - Initial labs drawn. - Continue prenatal vitamins. - Problem list reviewed and updated. - Genetic Screening discussed, First trimester screen, Quad screen, and NIPS: requested. - Ultrasound discussed; fetal anatomic survey: requested. - Anticipatory guidance about prenatal visits given including labs, ultrasounds, and testing. - Discussed usage of Babyscripts and virtual visits as additional source of managing and completing prenatal visits in midst of coronavirus and pandemic.   - Encouraged to complete MyChart Registration for her ability to review results, send requests, and have questions addressed.  - The nature of [facility] with multiple MDs and Advanced Practice Providers was explained to patient; also emphasized that residents, students are part of our team. - Routine obstetric precautions reviewed. Encouraged to seek out care at office or emergency room [facility] MAU preferred) for urgent and/or emergent concerns.  No follow-ups on file.    Future Appointments  Date Time Provider Department Center  12/15/2023 12:00 PM [facility] None  12/16/2023 12:00 PM [facility] None  12/18/2023 12:00 PM [facility] None  12/22/2023 10:00 AM [facility] None  12/24/2023 10:00 AM [facility] None  12/26/2023 10:00 AM [facility] None  12/29/2023 10:00 AM  [facility] None  12/31/2023 10:00 AM [facility] None  01/02/2024 10:00 AM [facility] None  01/05/2024 10:00 AM [facility] None  01/07/2024 10:00 AM [facility] None  01/09/2024 10:00 AM [facility] None

## 2023-12-12 ENCOUNTER — Encounter (HOSPITAL_COMMUNITY): Payer: Self-pay | Admitting: Obstetrics and Gynecology

## 2023-12-12 ENCOUNTER — Inpatient Hospital Stay (HOSPITAL_COMMUNITY)
Admission: AD | Admit: 2023-12-12 | Discharge: 2023-12-12 | Disposition: A | Discharge status: Home / Self Care | Payer: Self-pay | Attending: Obstetrics and Gynecology | Admitting: Obstetrics and Gynecology

## 2023-12-12 DIAGNOSIS — R030 Elevated blood-pressure reading, without diagnosis of hypertension: Secondary | ICD-10-CM | POA: Diagnosis not present

## 2023-12-12 DIAGNOSIS — O99891 Other specified diseases and conditions complicating pregnancy: Secondary | ICD-10-CM | POA: Diagnosis present

## 2023-12-12 DIAGNOSIS — O09291 Supervision of pregnancy with other poor reproductive or obstetric history, first trimester: Secondary | ICD-10-CM | POA: Diagnosis not present

## 2023-12-12 DIAGNOSIS — O26891 Other specified pregnancy related conditions, first trimester: Secondary | ICD-10-CM | POA: Diagnosis not present

## 2023-12-12 DIAGNOSIS — R109 Unspecified abdominal pain: Secondary | ICD-10-CM | POA: Diagnosis not present

## 2023-12-12 DIAGNOSIS — R112 Nausea with vomiting, unspecified: Secondary | ICD-10-CM | POA: Insufficient documentation

## 2023-12-12 DIAGNOSIS — R519 Headache, unspecified: Secondary | ICD-10-CM | POA: Diagnosis not present

## 2023-12-12 DIAGNOSIS — Z3A11 11 weeks gestation of pregnancy: Secondary | ICD-10-CM

## 2023-12-12 LAB — URINALYSIS, ROUTINE W REFLEX MICROSCOPIC
Bilirubin Urine: NEGATIVE
Glucose, UA: NEGATIVE mg/dL
Ketones, ur: NEGATIVE mg/dL
Leukocytes,Ua: NEGATIVE
Nitrite: NEGATIVE
Protein, ur: NEGATIVE mg/dL
Specific Gravity, Urine: 1.021 (ref 1.005–1.030)
pH: 5 (ref 5.0–8.0)

## 2023-12-12 LAB — CBC/D/PLT+RPR+RH+ABO+RUBIGG...
Antibody Screen: NEGATIVE
Basophils Absolute: 0 x10E3/uL (ref 0.0–0.2)
Basos: 0 %
EOS (ABSOLUTE): 0 x10E3/uL (ref 0.0–0.4)
Eos: 0 %
HCV Ab: NONREACTIVE
HIV Screen 4th Generation wRfx: NONREACTIVE
Hematocrit: 39.2 % (ref 34.0–46.6)
Hemoglobin: 12.5 g/dL (ref 11.1–15.9)
Hepatitis B Surface Ag: NEGATIVE
Immature Grans (Abs): 0.1 x10E3/uL (ref 0.0–0.1)
Immature Granulocytes: 1 %
Lymphocytes Absolute: 0.9 x10E3/uL (ref 0.7–3.1)
Lymphs: 7 %
MCH: 27.9 pg (ref 26.6–33.0)
MCHC: 31.9 g/dL (ref 31.5–35.7)
MCV: 88 fL (ref 79–97)
Monocytes Absolute: 0.8 x10E3/uL (ref 0.1–0.9)
Monocytes: 6 %
Neutrophils Absolute: 12.4 x10E3/uL — ABNORMAL HIGH (ref 1.4–7.0)
Neutrophils: 86 %
Platelets: 364 x10E3/uL (ref 150–450)
RBC: 4.48 x10E6/uL (ref 3.77–5.28)
RDW: 14.1 % (ref 11.7–15.4)
RPR Ser Ql: NONREACTIVE
Rh Factor: POSITIVE
Rubella Antibodies, IGG: 1.09 {index} (ref >0.99)
WBC: 14.3 x10E3/uL — ABNORMAL HIGH (ref 3.4–10.8)

## 2023-12-12 LAB — COMPREHENSIVE METABOLIC PANEL WITH GFR
ALT: 17 IU/L (ref 0–32)
ALT: 16 U/L (ref 0–44)
AST: 10 IU/L (ref 0–40)
AST: 11 U/L — ABNORMAL LOW (ref 15–41)
Albumin: 3.8 g/dL — ABNORMAL LOW (ref 4.0–5.0)
Albumin: 3.1 g/dL — ABNORMAL LOW (ref 3.5–5.0)
Alkaline Phosphatase: 55 IU/L (ref 41–116)
Alkaline Phosphatase: 42 U/L (ref 38–126)
Anion gap: 13 (ref 5–15)
BUN/Creatinine Ratio: 12 (ref 9–23)
BUN: 6 mg/dL (ref 6–20)
BUN: 7 mg/dL (ref 6–20)
Bilirubin Total: 0.2 mg/dL (ref 0.0–1.2)
CO2: 20 mmol/L (ref 20–29)
CO2: 22 mmol/L (ref 22–32)
Calcium: 9.4 mg/dL (ref 8.7–10.2)
Calcium: 9 mg/dL (ref 8.9–10.3)
Chloride: 100 mmol/L (ref 96–106)
Chloride: 100 mmol/L (ref 98–111)
Creatinine, Ser: 0.51 mg/dL — ABNORMAL LOW (ref 0.57–1.00)
Creatinine, Ser: 0.61 mg/dL (ref 0.44–1.00)
GFR, Estimated: >60 mL/min (ref >60)
Globulin, Total: 3.3 g/dL (ref 1.5–4.5)
Glucose, Bld: 85 mg/dL (ref 70–99)
Glucose: 93 mg/dL (ref 70–99)
Potassium: 4 mmol/L (ref 3.5–5.2)
Potassium: 3.4 mmol/L — ABNORMAL LOW (ref 3.5–5.1)
Sodium: 136 mmol/L (ref 134–144)
Sodium: 135 mmol/L (ref 135–145)
Total Bilirubin: 0.3 mg/dL (ref 0.0–1.2)
Total Protein: 7.1 g/dL (ref 6.0–8.5)
Total Protein: 7 g/dL (ref 6.5–8.1)
eGFR: 130 mL/min/1.73 (ref >59)

## 2023-12-12 LAB — CERVICOVAGINAL ANCILLARY ONLY
Bacterial Vaginitis (gardnerella): NEGATIVE
Candida Glabrata: NEGATIVE
Candida Vaginitis: NEGATIVE
Chlamydia: NEGATIVE
Comment: NEGATIVE
Comment: NEGATIVE
Comment: NEGATIVE
Comment: NEGATIVE
Comment: NEGATIVE
Comment: NORMAL
Neisseria Gonorrhea: NEGATIVE
Trichomonas: NEGATIVE

## 2023-12-12 LAB — CBC WITH DIFFERENTIAL/PLATELET
Abs Immature Granulocytes: 0.08 K/uL — ABNORMAL HIGH (ref 0.00–0.07)
Basophils Absolute: 0 K/uL (ref 0.0–0.1)
Basophils Relative: 0 %
Eosinophils Absolute: 0 K/uL (ref 0.0–0.5)
Eosinophils Relative: 0 %
HCT: 38 % (ref 36.0–46.0)
Hemoglobin: 12.8 g/dL (ref 12.0–15.0)
Immature Granulocytes: 1 %
Lymphocytes Relative: 17 %
Lymphs Abs: 2.3 K/uL (ref 0.7–4.0)
MCH: 28.4 pg (ref 26.0–34.0)
MCHC: 33.7 g/dL (ref 30.0–36.0)
MCV: 84.3 fL (ref 80.0–100.0)
Monocytes Absolute: 1 K/uL (ref 0.1–1.0)
Monocytes Relative: 8 %
Neutro Abs: 9.7 K/uL — ABNORMAL HIGH (ref 1.7–7.7)
Neutrophils Relative %: 74 %
Platelets: 341 K/uL (ref 150–400)
RBC: 4.51 MIL/uL (ref 3.87–5.11)
RDW: 14.2 % (ref 11.5–15.5)
WBC: 13 K/uL — ABNORMAL HIGH (ref 4.0–10.5)
nRBC: 0 % (ref 0.0–0.2)

## 2023-12-12 LAB — HCV INTERPRETATION

## 2023-12-12 LAB — PROTEIN / CREATININE RATIO, URINE
Creatinine, Urine: 317.2 mg/dL
Protein, Ur: 36.1 mg/dL
Protein/Creat Ratio: 114 mg/g{creat} (ref 0–200)

## 2023-12-12 LAB — HEMOGLOBIN A1C
Est. average glucose Bld gHb Est-mCnc: 117 mg/dL
Hgb A1c MFr Bld: 5.7 % — ABNORMAL HIGH (ref 4.8–5.6)

## 2023-12-12 LAB — MAGNESIUM: Magnesium: 1.7 mg/dL (ref 1.7–2.4)

## 2023-12-12 MED ORDER — ACETAMINOPHEN 500 MG PO TABS
1000.0000 mg | ORAL_TABLET | Freq: Once | ORAL | Status: AC
  Administered 2023-12-12: 1000 mg via ORAL
  Filled 2023-12-12: qty 2

## 2023-12-12 MED ORDER — LACTATED RINGERS IV BOLUS
1000.0000 mL | Freq: Once | INTRAVENOUS | Status: AC
  Administered 2023-12-12: 1000 mL via INTRAVENOUS

## 2023-12-12 MED ORDER — ONDANSETRON HCL 4 MG/2ML IJ SOLN
4.0000 mg | Freq: Four times a day (QID) | INTRAMUSCULAR | Status: DC | PRN
  Administered 2023-12-12: 4 mg via INTRAVENOUS
  Filled 2023-12-12: qty 2

## 2023-12-12 MED ORDER — FAMOTIDINE 20 MG PO TABS
20.0000 mg | ORAL_TABLET | Freq: Two times a day (BID) | ORAL | 2 refills | Status: DC | PRN
Start: 2023-12-12 — End: 2024-01-12

## 2023-12-12 NOTE — MAU Note (Signed)
 Amy Downs is a 28 y.o. at [redacted]w[redacted]d here in MAU reporting: BP has been high, after hours nurse wanted her to come in and get checked. Not on meds, was 147/92.  Causing HA and nausea.  Has not taken anything for HA. Stomach hurts, cramping at bottom, little pain on upper left.  No bleeding.   Onset of complaint: this morning Pain score: 5 HA, abd 4, 6 Vitals:   12/12/23 0854  BP: 131/83  Pulse: 82  Resp: 18  Temp: 98.3 F (36.8 C)  SpO2: 100%     FHT:150 Lab orders placed from triage: urine

## 2023-12-12 NOTE — MAU Note (Cosign Needed)
 Maternal Assessment Unit Provider Note  Subjective: Amy Downs is a 28 y.o. [phone] pregnant female at [redacted]w[redacted]d who presents to MAU today with complaint of elevated blood pressure.   This morning patient woke up feeling poorly.  She decided to take her blood pressure.  Blood pressures she had at home were 141/91, 147/92, 138/81.  She later vomited twice.  She called the after-hours clinic nurse and was told to come to the hospital.  Notably she has a history of preeclampsia with her prior twin pregnancy.  She also reports a headache associated with left-sided stabbing pain, flashes of light, phonophobia but not photophobia.  No associated focal weakness or numbness.  No medications tried.  Blood pressure was normal at her prenatal visit yesterday.  Her initial blood pressure in the MAU was normal, then she had 2 elevated blood pressures but she was vomiting.  After treatment see below, blood pressure returned to normal.  Receives care at [facility]. Prenatal records reviewed.  No vaginal bleeding, mild pelvic/abdominal cramping she believes is due to constipation, but no severe cramping  Pertinent items noted in HPI and remainder of comprehensive ROS otherwise negative.   Objective: BP 134/82   Pulse 82   Temp 98.3 F (36.8 C) (Oral)   Resp 16   Ht 5' 9 (1.753 m)   Wt 116 kg   LMP 09/22/2023 (Exact Date)   SpO2 100%   BMI 37.76 kg/m  Physical Exam Vitals reviewed.  Constitutional:      General: She is not in acute distress.    Appearance: She is well-developed. She is not diaphoretic.  Eyes:     General: No scleral icterus. Cardiovascular:     Rate and Rhythm: Normal rate and regular rhythm.     Heart sounds: Normal heart sounds. No murmur heard.    No gallop.  Pulmonary:     Effort: Pulmonary effort is normal. No respiratory distress.     Breath sounds: Normal breath sounds.  Abdominal:     General: There is no distension.     Palpations: Abdomen is soft.      Tenderness: There is no abdominal tenderness. There is no guarding or rebound.  Skin:    General: Skin is warm and dry.  Neurological:     Mental Status: She is alert.     Cranial Nerves: No facial asymmetry.     Comments: Gives appropriate history     MDM: Moderate risk  MAU Course: Patient presented to the MAU.  Initial blood pressure is normal, she is complaining of headache and vomiting as well.  IV was started with 1 L of LR, labs for magnesium, CMP, CBC sent with unremarkable results aside from mildly elevated WBC.  She was also given 1 g of Tylenol  and 4 mg of IV Zofran .  Blood pressures cycled, but then stopped while she was actively vomiting.  A couple elevated blood pressures noted while patient was actively vomiting.  Once patient was stabilized, blood pressure was normal.  During evaluation after treatment patient reports significantly improved 1-2 out of 10 headache pain only.  Vomiting resolved.  She is tolerating liquids and solids p.o.  Prior to discharge.  Message sent to [facility] for of BP follow-up visit.  Questions were answered to the satisfaction of the patient and/or family prior to discharge.   Assessment Medical screening exam complete    ICD-10-CM   1. Elevated blood pressure reading in office without diagnosis of hypertension  R03.0  2. Nausea and vomiting, unspecified vomiting type  R11.2     3. Acute nonintractable headache, unspecified headache type  R51.9        Plan Discharge from MAU in stable condition with strict return/first trimester pregnancy precautions  [facility] as scheduled for ongoing prenatal care  Allergies as of 12/12/2023       Reactions   Pollen Extract Itching        Medication List     TAKE these medications    famotidine  20 MG tablet Commonly known as: Pepcid  Take 1 tablet (20 mg total) by mouth 2 (two) times daily as needed for heartburn or indigestion.   methylPREDNISolone  16 MG tablet Commonly known  as: Medrol  Take 1 tablet (16 mg total) by mouth in the morning, at noon, and at bedtime for 14 days. THEN start tapering per medication directions.   methylPREDNISolone  4 MG tablet Commonly known as: Medrol  Take 4 tablets by mouth (16 mg total) in the morning AND bedtime AND 2 tablets (8 mg total) at midday for 1 day. THEN 4 tablets (16 mg total) in the morning AND 2 tablets (8 mg total) at midday and bedtime for 1 day. THEN 2 tablets (8 mg total) 3 (three) times daily for 1 day. THEN 2 tablets (8 mg total) morning AND bedtime AND 1 tablet (4 mg total) midday for 1 day. THEN 2 tablets (8 mg total) morning AND 1 tablet (4 mg total) midday and bedtime for 1 day. THEN 2 tablets (8 mg total) morning AND 1 tablet (4 mg total) midday for 2 days. THEN 2 tablets (8 mg total) morning for 2 days. THEN 1 tablet (4 mg total) morning for 2 days.   methylPREDNISolone  16 MG tablet Commonly known as: Medrol  Take 1 tablet (16 mg total) by mouth in the morning, at noon, and at bedtime for 14 days. Take this for two weeks and then begin to taper as prescribed.   metoCLOPramide  10 MG tablet Commonly known as: REGLAN  Take 1 tablet (10 mg total) by mouth every 6 (six) hours.   metroNIDAZOLE  0.75 % vaginal gel Commonly known as: METROGEL  Place 1 Applicatorful vaginally at bedtime. Apply one applicatorful to vagina at bedtime for 5 days   ondansetron  4 MG disintegrating tablet Commonly known as: ZOFRAN -ODT Take 1-2 tablets (4-8 mg total) by mouth every 8 (eight) hours as needed for nausea, vomiting or refractory nausea / vomiting.   polyethylene glycol powder 17 GM/SCOOP powder Commonly known as: GLYCOLAX /MIRALAX  Use once daily per package instructions.   scopolamine  1 MG/3DAYS Commonly known as: TRANSDERM-SCOP Place 1 patch onto the skin every 3 (three) days.        Amy Leeroy NOVAK, MD 12/12/2023 1:48 PM

## 2023-12-15 ENCOUNTER — Encounter (HOSPITAL_COMMUNITY)
Admission: RE | Admit: 2023-12-15 | Discharge: 2023-12-15 | Disposition: A | Discharge status: Home / Self Care | Source: Ambulatory Visit | Attending: Physician Assistant | Admitting: Physician Assistant

## 2023-12-15 VITALS — BP 120/71 | HR 89 | Temp 98.5°F | Resp 17

## 2023-12-15 DIAGNOSIS — Z8759 Personal history of other complications of pregnancy, childbirth and the puerperium: Secondary | ICD-10-CM

## 2023-12-15 DIAGNOSIS — E860 Dehydration: Secondary | ICD-10-CM

## 2023-12-15 MED ORDER — METOCLOPRAMIDE HCL 5 MG/ML IJ SOLN
INTRAMUSCULAR | Status: AC
Start: 2023-12-15 — End: 2023-12-15
  Filled 2023-12-15: qty 2

## 2023-12-15 MED ORDER — LACTATED RINGERS IV BOLUS
1000.0000 mL | Freq: Once | INTRAVENOUS | Status: AC
  Administered 2023-12-15: 1000 mL via INTRAVENOUS

## 2023-12-15 MED ORDER — METOCLOPRAMIDE HCL 5 MG/ML IJ SOLN
10.0000 mg | Freq: Once | INTRAMUSCULAR | Status: AC
  Administered 2023-12-15: 10 mg via INTRAVENOUS

## 2023-12-16 ENCOUNTER — Other Ambulatory Visit: Payer: Self-pay

## 2023-12-16 ENCOUNTER — Ambulatory Visit (HOSPITAL_COMMUNITY)
Admission: RE | Admit: 2023-12-16 | Discharge: 2023-12-16 | Disposition: A | Discharge status: Home / Self Care | Source: Ambulatory Visit | Attending: Physician Assistant | Admitting: Physician Assistant

## 2023-12-16 ENCOUNTER — Encounter: Payer: Self-pay | Admitting: Nurse Practitioner

## 2023-12-16 VITALS — BP 115/66 | HR 90 | Temp 98.4°F | Resp 16

## 2023-12-16 DIAGNOSIS — O210 Mild hyperemesis gravidarum: Secondary | ICD-10-CM | POA: Diagnosis not present

## 2023-12-16 DIAGNOSIS — E860 Dehydration: Secondary | ICD-10-CM

## 2023-12-16 DIAGNOSIS — Z8759 Personal history of other complications of pregnancy, childbirth and the puerperium: Secondary | ICD-10-CM

## 2023-12-16 MED ORDER — METOCLOPRAMIDE HCL 5 MG/ML IJ SOLN
10.0000 mg | Freq: Once | INTRAMUSCULAR | Status: AC
  Administered 2023-12-16: 10 mg via INTRAVENOUS

## 2023-12-16 MED ORDER — METOCLOPRAMIDE HCL 5 MG/ML IJ SOLN
INTRAMUSCULAR | Status: AC
Start: 2023-12-16 — End: 2023-12-16
  Filled 2023-12-16: qty 2

## 2023-12-16 MED ORDER — TERCONAZOLE 0.4 % VA CREA
1.0000 | TOPICAL_CREAM | Freq: Every day | VAGINAL | 0 refills | Status: DC
Start: 2023-12-16 — End: 2024-01-12

## 2023-12-16 MED ORDER — LACTATED RINGERS IV BOLUS
1000.0000 mL | Freq: Once | INTRAVENOUS | Status: AC
  Administered 2023-12-16: 1000 mL via INTRAVENOUS

## 2023-12-18 ENCOUNTER — Ambulatory Visit (HOSPITAL_COMMUNITY)
Admission: RE | Admit: 2023-12-18 | Discharge: 2023-12-18 | Disposition: A | Discharge status: Home / Self Care | Source: Ambulatory Visit | Attending: Physician Assistant | Admitting: Physician Assistant

## 2023-12-18 ENCOUNTER — Ambulatory Visit: Payer: Self-pay | Admitting: Family Medicine

## 2023-12-18 VITALS — BP 130/55 | HR 108 | Temp 98.0°F | Resp 16

## 2023-12-18 DIAGNOSIS — O210 Mild hyperemesis gravidarum: Secondary | ICD-10-CM | POA: Diagnosis not present

## 2023-12-18 DIAGNOSIS — E860 Dehydration: Secondary | ICD-10-CM

## 2023-12-18 DIAGNOSIS — O0990 Supervision of high risk pregnancy, unspecified, unspecified trimester: Secondary | ICD-10-CM

## 2023-12-18 DIAGNOSIS — Z8759 Personal history of other complications of pregnancy, childbirth and the puerperium: Secondary | ICD-10-CM

## 2023-12-18 LAB — PANORAMA PRENATAL TEST FULL PANEL:PANORAMA TEST PLUS 5 ADDITIONAL MICRODELETIONS: FETAL FRACTION: 7.5

## 2023-12-18 MED ORDER — LACTATED RINGERS IV BOLUS
1000.0000 mL | Freq: Once | INTRAVENOUS | Status: AC
  Administered 2023-12-18: 1000 mL via INTRAVENOUS

## 2023-12-18 MED ORDER — METOCLOPRAMIDE HCL 5 MG/ML IJ SOLN
INTRAMUSCULAR | Status: AC
  Filled 2023-12-18: qty 2

## 2023-12-18 MED ORDER — METOCLOPRAMIDE HCL 5 MG/ML IJ SOLN
10.0000 mg | Freq: Once | INTRAMUSCULAR | Status: AC
  Administered 2023-12-18: 10 mg via INTRAVENOUS

## 2023-12-22 ENCOUNTER — Encounter (HOSPITAL_COMMUNITY)

## 2023-12-22 ENCOUNTER — Other Ambulatory Visit: Payer: Self-pay

## 2023-12-22 ENCOUNTER — Inpatient Hospital Stay (HOSPITAL_COMMUNITY)
Admission: AD | Admit: 2023-12-22 | Discharge: 2023-12-22 | Disposition: A | Discharge status: Home / Self Care | Payer: Self-pay | Attending: Obstetrics and Gynecology | Admitting: Obstetrics and Gynecology

## 2023-12-22 ENCOUNTER — Encounter (HOSPITAL_COMMUNITY): Payer: Self-pay | Admitting: Obstetrics and Gynecology

## 2023-12-22 DIAGNOSIS — O34211 Maternal care for low transverse scar from previous cesarean delivery: Secondary | ICD-10-CM | POA: Insufficient documentation

## 2023-12-22 DIAGNOSIS — O09291 Supervision of pregnancy with other poor reproductive or obstetric history, first trimester: Secondary | ICD-10-CM | POA: Diagnosis not present

## 2023-12-22 DIAGNOSIS — R0989 Other specified symptoms and signs involving the circulatory and respiratory systems: Secondary | ICD-10-CM | POA: Diagnosis present

## 2023-12-22 DIAGNOSIS — O219 Vomiting of pregnancy, unspecified: Secondary | ICD-10-CM

## 2023-12-22 DIAGNOSIS — R519 Headache, unspecified: Secondary | ICD-10-CM | POA: Diagnosis present

## 2023-12-22 DIAGNOSIS — Z3A13 13 weeks gestation of pregnancy: Secondary | ICD-10-CM

## 2023-12-22 DIAGNOSIS — O26891 Other specified pregnancy related conditions, first trimester: Secondary | ICD-10-CM | POA: Diagnosis not present

## 2023-12-22 DIAGNOSIS — O10919 Unspecified pre-existing hypertension complicating pregnancy, unspecified trimester: Secondary | ICD-10-CM

## 2023-12-22 DIAGNOSIS — O210 Mild hyperemesis gravidarum: Secondary | ICD-10-CM | POA: Diagnosis not present

## 2023-12-22 DIAGNOSIS — O10911 Unspecified pre-existing hypertension complicating pregnancy, first trimester: Secondary | ICD-10-CM | POA: Diagnosis not present

## 2023-12-22 DIAGNOSIS — O10011 Pre-existing essential hypertension complicating pregnancy, first trimester: Secondary | ICD-10-CM | POA: Insufficient documentation

## 2023-12-22 DIAGNOSIS — O26892 Other specified pregnancy related conditions, second trimester: Secondary | ICD-10-CM

## 2023-12-22 LAB — CBC
HCT: 38.7 % (ref 36.0–46.0)
Hemoglobin: 12.7 g/dL (ref 12.0–15.0)
MCH: 28.4 pg (ref 26.0–34.0)
MCHC: 32.8 g/dL (ref 30.0–36.0)
MCV: 86.6 fL (ref 80.0–100.0)
Platelets: 277 K/uL (ref 150–400)
RBC: 4.47 MIL/uL (ref 3.87–5.11)
RDW: 14.4 % (ref 11.5–15.5)
WBC: 12.4 K/uL — ABNORMAL HIGH (ref 4.0–10.5)
nRBC: 0 % (ref 0.0–0.2)

## 2023-12-22 LAB — URINALYSIS, ROUTINE W REFLEX MICROSCOPIC
Bilirubin Urine: NEGATIVE
Glucose, UA: NEGATIVE mg/dL
Ketones, ur: 5 mg/dL — AB
Leukocytes,Ua: NEGATIVE
Nitrite: NEGATIVE
Protein, ur: NEGATIVE mg/dL
Specific Gravity, Urine: 1.017 (ref 1.005–1.030)
pH: 6 (ref 5.0–8.0)

## 2023-12-22 LAB — COMPREHENSIVE METABOLIC PANEL WITH GFR
ALT: 23 U/L (ref 0–44)
AST: 17 U/L (ref 15–41)
Albumin: 3 g/dL — ABNORMAL LOW (ref 3.5–5.0)
Alkaline Phosphatase: 48 U/L (ref 38–126)
Anion gap: 10 (ref 5–15)
BUN: <5 mg/dL — ABNORMAL LOW (ref 6–20)
CO2: 25 mmol/L (ref 22–32)
Calcium: 8.9 mg/dL (ref 8.9–10.3)
Chloride: 101 mmol/L (ref 98–111)
Creatinine, Ser: 0.72 mg/dL (ref 0.44–1.00)
GFR, Estimated: >60 mL/min (ref >60)
Glucose, Bld: 94 mg/dL (ref 70–99)
Potassium: 3.4 mmol/L — ABNORMAL LOW (ref 3.5–5.1)
Sodium: 136 mmol/L (ref 135–145)
Total Bilirubin: 0.5 mg/dL (ref 0.0–1.2)
Total Protein: 7.2 g/dL (ref 6.5–8.1)

## 2023-12-22 MED ORDER — CYCLOBENZAPRINE HCL 10 MG PO TABS: 10.0000 mg | ORAL_TABLET | Freq: Two times a day (BID) | ORAL | 0 refills | Status: DC | PRN

## 2023-12-22 MED ORDER — CYCLOBENZAPRINE HCL 5 MG PO TABS
10.0000 mg | ORAL_TABLET | Freq: Once | ORAL | Status: AC
  Administered 2023-12-22: 10 mg via ORAL
  Filled 2023-12-22: qty 2

## 2023-12-22 MED ORDER — SCOPOLAMINE 1 MG/3DAYS TD PT72
1.0000 | MEDICATED_PATCH | TRANSDERMAL | Status: DC
Start: 2023-12-22 — End: 2023-12-22

## 2023-12-22 MED ORDER — METOCLOPRAMIDE HCL 10 MG PO TABS: 10.0000 mg | ORAL_TABLET | Freq: Four times a day (QID) | ORAL | 0 refills | Status: AC | PRN

## 2023-12-22 MED ORDER — ACETAMINOPHEN-CAFFEINE 500-65 MG PO TABS
2.0000 | ORAL_TABLET | Freq: Once | ORAL | Status: AC
  Administered 2023-12-22: 2 via ORAL
  Filled 2023-12-22: qty 2

## 2023-12-22 MED ORDER — ACETAMINOPHEN-CAFFEINE 500-65 MG PO TABS: 2.0000 | ORAL_TABLET | Freq: Four times a day (QID) | ORAL | 0 refills | Status: AC | PRN

## 2023-12-22 MED ORDER — LACTATED RINGERS IV BOLUS
1000.0000 mL | Freq: Once | INTRAVENOUS | Status: AC
  Administered 2023-12-22: 1000 mL via INTRAVENOUS

## 2023-12-22 MED ORDER — ONDANSETRON 4 MG PO TBDP: 8.0000 mg | ORAL_TABLET | Freq: Once | ORAL | Status: DC

## 2023-12-22 MED ORDER — METOCLOPRAMIDE HCL 5 MG/ML IJ SOLN
10.0000 mg | Freq: Once | INTRAMUSCULAR | Status: AC
Start: 2023-12-22 — End: 2023-12-22
  Administered 2023-12-22: 10 mg via INTRAVENOUS
  Filled 2023-12-22: qty 2

## 2023-12-22 NOTE — Discharge Instructions (Signed)
 HEADACHE: If you get a headache at home, I recommend doing the following: 1) Drink 64 oz of water. Make sure you have something to eat. 2) Take all 3 of the medicines that were sent to the pharmacy: Take 1 tablet of cyclobenzaprine  (Flexeril ) and 1 tablet of metoclopramide  (Reglan ). Take 2 tablets of Excedrin-Tension (NOT Excedrin migraine). I have sent a prescription for this, but if insurance will not cover it you can get it over the counter. Look for TENSION headache formulation. The main ingredients should be Acetaminophen  (same as Tylenol ) and Caffeine ; should NOT have Aspirin in the formulation  3) If you need to you can also take Benadryl . This will likely make you very sleepy. Please take a nap as sleep can help headaches go away.   If your headache does not improve after trying these, please go to the MAU.

## 2023-12-22 NOTE — MAU Note (Signed)
 Amy Downs is a 28 y.o. at [redacted]w[redacted]d here in MAU reporting: constant headache for the past three days. States she's been monitoring her blood pressures because she has problems with her blood pressures when she's pregnant and they have been up and down States also feels nauseated and dizzy.    Pain score: 9/10 Vitals:   12/22/23 2114  BP: (!) 135/97  Pulse: 89  Resp: 16  Temp: 98.1 F (36.7 C)  SpO2: 100%     FHT: 140  Lab orders placed from triage: UA

## 2023-12-22 NOTE — MAU Provider Note (Signed)
 Chief Complaint:  Headache   HPI   None     Amy Downs is a 28 y.o. [id number] at [redacted]w[redacted]d who presents to maternity admissions reporting headache, nausea. She reports she has been monitoring her blood pressures at home which have been up and down.  She has been getting infusions of IV fluids and Reglan  at the infusion center for hyperemesis, which she states has been working well for the nausea up until her headache started 3 days ago. She saw some spots/flickers in her vision before the headache started. The pain is primarily on her left side especially behind her eye. She endorses associated photophobia and nausea. Denies history of migraines, only other prior episode of similar headaches were during severe preeclampsia episode in pregnancy.  Pregnancy Course: Receives care at [facility] for [facility] . Prenatal records reviewed. Pregnancy complicated by hyperemesis gravidarum, history of severe preeclampsia, and history of cesarean section.  Past Medical History:  Diagnosis Date   Abscess    under arm- drained/cut. 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 so pt asked MD for medication for herself, pt declines ever having an outbreak   UTI (urinary tract infection)    OB History  Gravida Para Term Preterm AB Living  3 2 1 1  0 3  SAB IAB Ectopic Multiple Live Births  0 0 0 1 3    # Outcome Date GA Lbr Len/2nd Weight Sex Type Anes PTL Lv  3 Current           2 Term 08/20/15 [redacted]w[redacted]d 04:10 / 00:23 3220 g M VBAC EPI N LIV  1A Preterm 03/28/14 [redacted]w[redacted]d  2730 g M CS-LTranv Spinal N LIV  1B Preterm 03/28/14 [redacted]w[redacted]d  2030 g M CS-LTranv Spinal N LIV   Past Surgical History:  Procedure Laterality Date   CESAREAN SECTION     twin IUP with PreE   HERNIA REPAIR     WISDOM TOOTH EXTRACTION     Family History  Problem Relation Age of Onset   Stroke Mother 6   Heart  disease Father    Hypertension Maternal Grandmother    COPD Maternal Grandmother    Kidney disease Paternal Grandmother    Social History   Tobacco Use   Smoking status: Never   Smokeless tobacco: Never  Vaping Use   Vaping status: Never Used  Substance Use Topics   Alcohol use: Not Currently   Drug use: Not Currently    Types: Marijuana    Comment: hasn't smoked since found out she was pregnant   Allergies  Allergen Reactions   Pollen Extract Itching   Medications Prior to Admission  Medication Sig Dispense Refill Last Dose/Taking   famotidine  (PEPCID ) 20 MG tablet Take 1 tablet (20 mg total) by mouth 2 (two) times daily as needed for heartburn or indigestion. 60 tablet 2 12/22/2023   methylPREDNISolone  (MEDROL ) 4 MG tablet Take 4 tablets by mouth (16 mg total) in the morning AND bedtime AND 2 tablets (8 mg total) at midday for 1 day. THEN 4 tablets (16 mg total) in the morning AND 2 tablets (8 mg total) at midday and bedtime for 1 day. THEN 2 tablets (8 mg total) 3 (three) times daily for 1 day. THEN 2 tablets (8 mg total) morning AND bedtime AND 1 tablet (4 mg total) midday for 1 day. THEN  2 tablets (8 mg total) morning AND 1 tablet (4 mg total) midday and bedtime for 1 day. THEN 2 tablets (8 mg total) morning AND 1 tablet (4 mg total) midday for 2 days. THEN 2 tablets (8 mg total) morning for 2 days. THEN 1 tablet (4 mg total) morning for 2 days. 45 tablet 0 12/22/2023   ondansetron  (ZOFRAN -ODT) 4 MG disintegrating tablet Take 1-2 tablets (4-8 mg total) by mouth every 8 (eight) hours as needed for nausea, vomiting or refractory nausea / vomiting. 30 tablet 0 12/22/2023 at  6:30 PM   scopolamine  (TRANSDERM-SCOP) 1 MG/3DAYS Place 1 patch onto the skin every 3 (three) days.   12/22/2023   [DISCONTINUED] metoCLOPramide  (REGLAN ) 10 MG tablet Take 1 tablet (10 mg total) by mouth every 6 (six) hours. 60 tablet 0 12/22/2023   polyethylene glycol powder (GLYCOLAX /MIRALAX ) 17 GM/SCOOP powder Use  once daily per package instructions. 850 g 11    terconazole (TERAZOL 7) 0.4 % vaginal cream Place 1 applicator vaginally at bedtime. 45 g 0     I have reviewed patient's Past Medical Hx, Surgical Hx, Family Hx, Social Hx, medications and allergies.   ROS  Pertinent items noted in HPI and remainder of comprehensive ROS otherwise negative.   PHYSICAL EXAM  Patient Vitals for the past 24 hrs:  BP Temp Temp src Pulse Resp SpO2 Height Weight  12/22/23 2212 123/75 -- -- 86 -- -- -- --  12/22/23 2114 (!) 135/97 98.1 F (36.7 C) Oral 89 16 100 % 5' 9 (1.753 m) 114.4 kg    Constitutional: Well-developed, well-nourished female in no acute distress.  HEENT: atraumatic, normocephalic. Neck has normal ROM. EOM intact. Cardiovascular: normal rate & rhythm, warm and well-perfused Respiratory: normal effort, no problems with respiration noted GI: Abd soft, non-tender, non-distended MSK: Extremities nontender, no edema, normal ROM Skin: warm and dry. Acyanotic, no jaundice or pallor. Neurologic: Alert and oriented x 4. No abnormal coordination. Psychiatric: Normal mood. Speech not slurred, not rapid/pressured. Patient is cooperative.  Labs: Results for orders placed or performed during the hospital encounter of 12/22/23 (from the past 24 hours)  CBC     Status: Abnormal   Collection Time: 12/22/23 10:11 PM  Result Value Ref Range   WBC 12.4 (H) 4.0 - 10.5 K/uL   RBC 4.47 3.87 - 5.11 MIL/uL   Hemoglobin 12.7 12.0 - 15.0 g/dL   HCT 61.2 63.9 - 53.9 %   MCV 86.6 80.0 - 100.0 fL   MCH 28.4 26.0 - 34.0 pg   MCHC 32.8 30.0 - 36.0 g/dL   RDW 85.5 88.4 - 84.4 %   Platelets 277 150 - 400 K/uL   nRBC 0.0 0.0 - 0.2 %  Comprehensive metabolic panel     Status: Abnormal   Collection Time: 12/22/23 10:11 PM  Result Value Ref Range   Sodium 136 135 - 145 mmol/L   Potassium 3.4 (L) 3.5 - 5.1 mmol/L   Chloride 101 98 - 111 mmol/L   CO2 25 22 - 32 mmol/L   Glucose, Bld 94 70 - 99 mg/dL   BUN <5 (L)  6 - 20 mg/dL   Creatinine, Ser 9.27 0.44 - 1.00 mg/dL   Calcium 8.9 8.9 - 89.6 mg/dL   Total Protein 7.2 6.5 - 8.1 g/dL   Albumin 3.0 (L) 3.5 - 5.0 g/dL   AST 17 15 - 41 U/L   ALT 23 0 - 44 U/L   Alkaline Phosphatase 48 38 - 126 U/L  Total Bilirubin 0.5 0.0 - 1.2 mg/dL   GFR, Estimated >39 >39 mL/min   Anion gap 10 5 - 15  Urinalysis, Routine w reflex microscopic -Urine, Clean Catch     Status: Abnormal   Collection Time: 12/22/23 10:12 PM  Result Value Ref Range   Color, Urine YELLOW YELLOW   APPearance HAZY (A) CLEAR   Specific Gravity, Urine 1.017 1.005 - 1.030   pH 6.0 5.0 - 8.0   Glucose, UA NEGATIVE NEGATIVE mg/dL   Hgb urine dipstick SMALL (A) NEGATIVE   Bilirubin Urine NEGATIVE NEGATIVE   Ketones, ur 5 (A) NEGATIVE mg/dL   Protein, ur NEGATIVE NEGATIVE mg/dL   Nitrite NEGATIVE NEGATIVE   Leukocytes,Ua NEGATIVE NEGATIVE   RBC / HPF 0-5 0 - 5 RBC/hpf   WBC, UA 0-5 0 - 5 WBC/hpf   Bacteria, UA RARE (A) NONE SEEN   Squamous Epithelial / HPF 6-10 0 - 5 /HPF   Mucus PRESENT     Imaging:  No results found.  MDM & MAU COURSE  MDM: Moderate  MAU Course: -BP mildly elevated on initial check. Reviewed BP readings this pregnancy, pattern of initial BP elevation often while actively vomiting which normalizes on recheck.  -Start IV fluids, Excedrin-Tension, Reglan , and Flexeril . If ineffective, will give Imitrex.  -CBC within normal limits, no anemia. CMP without significant abnormality. -Headache completely resolved after treatment. Sending prescriptions for medications to pharmacy.  Differential diagnosis considered for headache includes but is not limited to: preeclampsia, tension headache, cluster, trauma, concussion, migraine, viral syndrome  Orders Placed This Encounter  Procedures   Urinalysis, Routine w reflex microscopic -Urine, Clean Catch   CBC   Comprehensive metabolic panel   Discharge patient   Meds ordered this encounter  Medications    acetaminophen -caffeine  (EXCEDRIN TENSION HEADACHE) 500-65 MG per tablet 2 tablet   cyclobenzaprine  (FLEXERIL ) tablet 10 mg   DISCONTD: ondansetron  (ZOFRAN -ODT) disintegrating tablet 8 mg   DISCONTD: scopolamine  (TRANSDERM-SCOP) 1 MG/3DAYS 1 mg   lactated ringers  bolus 1,000 mL   metoCLOPramide  (REGLAN ) injection 10 mg   metoCLOPramide  (REGLAN ) 10 MG tablet    Sig: Take 1 tablet (10 mg total) by mouth every 6 (six) hours as needed (nausea or headache).    Dispense:  60 tablet    Refill:  0   acetaminophen -caffeine  (EXCEDRIN TENSION HEADACHE) 500-65 MG TABS per tablet    Sig: Take 2 tablets by mouth every 6 (six) hours as needed (headache).    Dispense:  60 tablet    Refill:  0   cyclobenzaprine  (FLEXERIL ) 10 MG tablet    Sig: Take 1 tablet (10 mg total) by mouth 2 (two) times daily as needed (headache).    Dispense:  20 tablet    Refill:  0    ASSESSMENT   1. Chronic hypertension affecting pregnancy   2. Headache in pregnancy, antepartum, second trimester   3. Nausea and vomiting during pregnancy   4. [redacted] weeks gestation of pregnancy     PLAN  Discharge home in stable condition with return precautions.  Continue IV fluid and Reglan  infusion outpatient. For headaches, use Excedrin Tension, Flexeril , and Reglan  prn.   Allergies as of 12/22/2023       Reactions   Pollen Extract Itching        Medication List     TAKE these medications    acetaminophen -caffeine  500-65 MG Tabs per tablet Commonly known as: EXCEDRIN TENSION HEADACHE Take 2 tablets by mouth every 6 (six) hours as  needed (headache).   cyclobenzaprine  10 MG tablet Commonly known as: FLEXERIL  Take 1 tablet (10 mg total) by mouth 2 (two) times daily as needed (headache).   famotidine  20 MG tablet Commonly known as: Pepcid  Take 1 tablet (20 mg total) by mouth 2 (two) times daily as needed for heartburn or indigestion.   methylPREDNISolone  4 MG tablet Commonly known as: Medrol  Take 4 tablets by mouth (16  mg total) in the morning AND bedtime AND 2 tablets (8 mg total) at midday for 1 day. THEN 4 tablets (16 mg total) in the morning AND 2 tablets (8 mg total) at midday and bedtime for 1 day. THEN 2 tablets (8 mg total) 3 (three) times daily for 1 day. THEN 2 tablets (8 mg total) morning AND bedtime AND 1 tablet (4 mg total) midday for 1 day. THEN 2 tablets (8 mg total) morning AND 1 tablet (4 mg total) midday and bedtime for 1 day. THEN 2 tablets (8 mg total) morning AND 1 tablet (4 mg total) midday for 2 days. THEN 2 tablets (8 mg total) morning for 2 days. THEN 1 tablet (4 mg total) morning for 2 days.   metoCLOPramide  10 MG tablet Commonly known as: REGLAN  Take 1 tablet (10 mg total) by mouth every 6 (six) hours as needed (nausea or headache). What changed:  when to take this reasons to take this   ondansetron  4 MG disintegrating tablet Commonly known as: ZOFRAN -ODT Take 1-2 tablets (4-8 mg total) by mouth every 8 (eight) hours as needed for nausea, vomiting or refractory nausea / vomiting.   polyethylene glycol powder 17 GM/SCOOP powder Commonly known as: GLYCOLAX /MIRALAX  Use once daily per package instructions.   scopolamine  1 MG/3DAYS Commonly known as: TRANSDERM-SCOP Place 1 patch onto the skin every 3 (three) days.   terconazole 0.4 % vaginal cream Commonly known as: TERAZOL 7 Place 1 applicator vaginally at bedtime.        Joesph DELENA Sear, PA

## 2023-12-24 ENCOUNTER — Inpatient Hospital Stay (HOSPITAL_COMMUNITY): Admission: RE | Admit: 2023-12-24 | Source: Ambulatory Visit

## 2023-12-24 LAB — HORIZON CUSTOM: REPORT SUMMARY: NEGATIVE

## 2023-12-25 LAB — CYTOLOGY - PAP: Adequacy: ABNORMAL

## 2023-12-26 ENCOUNTER — Inpatient Hospital Stay (HOSPITAL_COMMUNITY): Admission: RE | Admit: 2023-12-26 | Source: Ambulatory Visit

## 2023-12-29 ENCOUNTER — Other Ambulatory Visit: Payer: Self-pay

## 2023-12-29 ENCOUNTER — Ambulatory Visit: Admitting: *Deleted

## 2023-12-29 ENCOUNTER — Ambulatory Visit (HOSPITAL_COMMUNITY)
Admission: RE | Admit: 2023-12-29 | Discharge: 2023-12-29 | Disposition: A | Discharge status: Home / Self Care | Source: Ambulatory Visit | Attending: Physician Assistant | Admitting: Physician Assistant

## 2023-12-29 VITALS — BP 128/77 | HR 97 | Wt 258.0 lb

## 2023-12-29 VITALS — BP 116/82 | HR 82 | Temp 97.7°F | Resp 17

## 2023-12-29 DIAGNOSIS — O0992 Supervision of high risk pregnancy, unspecified, second trimester: Secondary | ICD-10-CM

## 2023-12-29 DIAGNOSIS — Z8759 Personal history of other complications of pregnancy, childbirth and the puerperium: Secondary | ICD-10-CM | POA: Insufficient documentation

## 2023-12-29 DIAGNOSIS — Z3A14 14 weeks gestation of pregnancy: Secondary | ICD-10-CM

## 2023-12-29 DIAGNOSIS — O0990 Supervision of high risk pregnancy, unspecified, unspecified trimester: Secondary | ICD-10-CM

## 2023-12-29 DIAGNOSIS — E860 Dehydration: Secondary | ICD-10-CM | POA: Diagnosis present

## 2023-12-29 MED ORDER — METOCLOPRAMIDE HCL 5 MG/ML IJ SOLN
10.0000 mg | Freq: Once | INTRAMUSCULAR | Status: AC
  Administered 2023-12-29: 10 mg via INTRAVENOUS

## 2023-12-29 MED ORDER — LACTATED RINGERS IV BOLUS
1000.0000 mL | Freq: Once | INTRAVENOUS | Status: AC
  Administered 2023-12-29: 1000 mL via INTRAVENOUS

## 2023-12-29 MED ORDER — METOCLOPRAMIDE HCL 5 MG/ML IJ SOLN
INTRAMUSCULAR | Status: AC
Start: 2023-12-29 — End: 2023-12-29
  Filled 2023-12-29: qty 2

## 2023-12-29 NOTE — Progress Notes (Signed)
 Here for BP check today. BP wnl. Denies HA. Reviewed next ob appointment. She voices understanding. Rock Skip PEAK

## 2023-12-31 ENCOUNTER — Encounter (HOSPITAL_COMMUNITY)
Admission: RE | Admit: 2023-12-31 | Discharge: 2023-12-31 | Disposition: A | Discharge status: Home / Self Care | Source: Ambulatory Visit | Attending: Physician Assistant | Admitting: Physician Assistant

## 2023-12-31 VITALS — BP 108/67 | HR 93 | Temp 98.8°F | Resp 17

## 2023-12-31 DIAGNOSIS — E860 Dehydration: Secondary | ICD-10-CM | POA: Diagnosis present

## 2023-12-31 DIAGNOSIS — Z8759 Personal history of other complications of pregnancy, childbirth and the puerperium: Secondary | ICD-10-CM | POA: Diagnosis present

## 2023-12-31 MED ORDER — METOCLOPRAMIDE HCL 5 MG/ML IJ SOLN
10.0000 mg | Freq: Once | INTRAMUSCULAR | Status: AC
  Administered 2023-12-31: 10 mg via INTRAVENOUS

## 2023-12-31 MED ORDER — METOCLOPRAMIDE HCL 5 MG/ML IJ SOLN
INTRAMUSCULAR | Status: AC
  Filled 2023-12-31: qty 2

## 2023-12-31 MED ORDER — LACTATED RINGERS IV BOLUS
1000.0000 mL | Freq: Once | INTRAVENOUS | Status: AC
  Administered 2023-12-31: 1000 mL via INTRAVENOUS

## 2024-01-02 ENCOUNTER — Encounter (HOSPITAL_COMMUNITY)
Admission: RE | Admit: 2024-01-02 | Discharge: 2024-01-02 | Disposition: A | Discharge status: Home / Self Care | Source: Ambulatory Visit | Attending: Physician Assistant | Admitting: Physician Assistant

## 2024-01-02 ENCOUNTER — Encounter: Payer: Self-pay | Admitting: Nurse Practitioner

## 2024-01-02 VITALS — BP 137/83 | HR 93 | Temp 98.3°F | Resp 16

## 2024-01-02 DIAGNOSIS — Z8759 Personal history of other complications of pregnancy, childbirth and the puerperium: Secondary | ICD-10-CM

## 2024-01-02 DIAGNOSIS — E860 Dehydration: Secondary | ICD-10-CM

## 2024-01-02 MED ORDER — METOCLOPRAMIDE HCL 5 MG/ML IJ SOLN
INTRAMUSCULAR | Status: AC
  Filled 2024-01-02: qty 2

## 2024-01-02 MED ORDER — METOCLOPRAMIDE HCL 5 MG/ML IJ SOLN
10.0000 mg | Freq: Once | INTRAMUSCULAR | Status: AC
  Administered 2024-01-02: 10 mg via INTRAVENOUS

## 2024-01-02 MED ORDER — LACTATED RINGERS IV BOLUS
1000.0000 mL | Freq: Once | INTRAVENOUS | Status: AC
  Administered 2024-01-02: 1000 mL via INTRAVENOUS

## 2024-01-05 ENCOUNTER — Inpatient Hospital Stay (HOSPITAL_COMMUNITY): Admission: RE | Admit: 2024-01-05 | Source: Ambulatory Visit

## 2024-01-07 ENCOUNTER — Ambulatory Visit (HOSPITAL_COMMUNITY)
Admission: RE | Admit: 2024-01-07 | Discharge: 2024-01-07 | Disposition: A | Discharge status: Home / Self Care | Source: Ambulatory Visit | Attending: Physician Assistant | Admitting: Physician Assistant

## 2024-01-07 VITALS — BP 105/68 | HR 96 | Temp 98.5°F | Resp 16

## 2024-01-07 DIAGNOSIS — E860 Dehydration: Secondary | ICD-10-CM | POA: Diagnosis present

## 2024-01-07 DIAGNOSIS — Z8759 Personal history of other complications of pregnancy, childbirth and the puerperium: Secondary | ICD-10-CM | POA: Insufficient documentation

## 2024-01-07 MED ORDER — METOCLOPRAMIDE HCL 5 MG/ML IJ SOLN
10.0000 mg | Freq: Once | INTRAMUSCULAR | Status: AC
  Administered 2024-01-07: 10 mg via INTRAVENOUS

## 2024-01-07 MED ORDER — METOCLOPRAMIDE HCL 5 MG/ML IJ SOLN
INTRAMUSCULAR | Status: AC
  Filled 2024-01-07: qty 2

## 2024-01-07 MED ORDER — LACTATED RINGERS IV BOLUS
1000.0000 mL | Freq: Once | INTRAVENOUS | Status: AC
  Administered 2024-01-07: 1000 mL via INTRAVENOUS

## 2024-01-09 ENCOUNTER — Inpatient Hospital Stay (HOSPITAL_COMMUNITY): Admission: RE | Admit: 2024-01-09 | Source: Ambulatory Visit

## 2024-01-12 ENCOUNTER — Ambulatory Visit: Admitting: Obstetrics & Gynecology

## 2024-01-12 ENCOUNTER — Other Ambulatory Visit: Payer: Self-pay

## 2024-01-12 VITALS — BP 126/78 | HR 104 | Wt 253.9 lb

## 2024-01-12 DIAGNOSIS — Z3A16 16 weeks gestation of pregnancy: Secondary | ICD-10-CM

## 2024-01-12 DIAGNOSIS — O210 Mild hyperemesis gravidarum: Secondary | ICD-10-CM | POA: Diagnosis not present

## 2024-01-12 DIAGNOSIS — O0992 Supervision of high risk pregnancy, unspecified, second trimester: Secondary | ICD-10-CM

## 2024-01-12 DIAGNOSIS — O0990 Supervision of high risk pregnancy, unspecified, unspecified trimester: Secondary | ICD-10-CM

## 2024-01-12 MED ORDER — PANTOPRAZOLE SODIUM 40 MG PO TBEC: 40.0000 mg | DELAYED_RELEASE_TABLET | Freq: Two times a day (BID) | ORAL | 1 refills | Status: AC

## 2024-01-12 NOTE — Progress Notes (Unsigned)
   PRENATAL VISIT NOTE  Subjective:  Amy Downs is a 28 y.o. G3P1103 at [redacted]w[redacted]d being seen today for ongoing prenatal care.  She is currently monitored for the following issues for this high-risk pregnancy and has History of C-section; History of severe pre-eclampsia; History of hyperemesis gravidarum; Hyperemesis gravidarum; Supervision of high risk pregnancy, antepartum; and Dehydration on their problem list.  Patient reports nausea and vomiting.   . Vag. Bleeding: None.   . Denies leaking of fluid.   The following portions of the patient's history were reviewed and updated as appropriate: allergies, current medications, past family history, past medical history, past social history, past surgical history and problem list.   Objective:   Vitals:   01/12/24 1644  BP: 126/78  Pulse: (!) 104  Weight: 253 lb 14.4 oz (115.2 kg)    Fetal Status:  Fetal Heart Rate (bpm): 145        General: Alert, oriented and cooperative. Patient is in no acute distress.  Skin: Skin is warm and dry. No rash noted.   Cardiovascular: Normal heart rate noted  Respiratory: Normal respiratory effort, no problems with respiration noted  Abdomen: Soft, gravid, appropriate for gestational age.  Pain/Pressure: Absent     Pelvic: Cervical exam deferred        Extremities: Normal range of motion.     Mental Status: Normal mood and affect. Normal behavior. Normal judgment and thought content.      12/11/2023    9:31 AM  Depression screen PHQ 2/9  Decreased Interest 1  Down, Depressed, Hopeless 0  PHQ - 2 Score 1  Altered sleeping 2  Tired, decreased energy 3  Change in appetite 3  Feeling bad or failure about yourself  0  Trouble concentrating 0  Moving slowly or fidgety/restless 0  Suicidal thoughts 0  PHQ-9 Score 9      Data saved with a previous flowsheet row definition        12/11/2023    9:43 AM  GAD 7 : Generalized Anxiety Score  Nervous, Anxious, on Edge 1  Control/stop  worrying 0  Worry too much - different things 0  Trouble relaxing 0  Restless 0  Easily annoyed or irritable 1  Afraid - awful might happen 1  Total GAD 7 Score 3    Assessment and Plan:  Pregnancy: G3P1103 at [redacted]w[redacted]d 1. Supervision of high risk pregnancy, antepartum (Primary)  - pantoprazole  (PROTONIX ) 40 MG tablet; Take 1 tablet (40 mg total) by mouth 2 (two) times daily.  Dispense: 60 tablet; Refill: 1 Supervision of high risk pregnancy, antepartum - Plan: pantoprazole  (PROTONIX ) 40 MG tablet  Hyperemesis gravidarum  [redacted] weeks gestation of pregnancy  Preterm labor symptoms and general obstetric precautions including but not limited to vaginal bleeding, contractions, leaking of fluid and fetal movement were reviewed in detail with the patient. Please refer to After Visit Summary for other counseling recommendations.   Return in about 4 weeks (around 02/09/2024).  Future Appointments  Date Time Provider Department Center  02/20/2024  9:00 AM [facility] PROVIDER 1 [facility]  02/20/2024  9:30 AM [facility]2 [facility]    Lynwood Solomons, MD

## 2024-01-22 NOTE — Progress Notes (Signed)
 This encounter was created in error - please disregard.

## 2024-02-12 ENCOUNTER — Inpatient Hospital Stay (HOSPITAL_COMMUNITY)
Admission: AD | Admit: 2024-02-12 | Discharge: 2024-02-12 | Disposition: A | Discharge status: Home / Self Care | Attending: Obstetrics & Gynecology | Admitting: Obstetrics & Gynecology

## 2024-02-12 DIAGNOSIS — Z3A20 20 weeks gestation of pregnancy: Secondary | ICD-10-CM

## 2024-02-12 DIAGNOSIS — O99612 Diseases of the digestive system complicating pregnancy, second trimester: Secondary | ICD-10-CM | POA: Insufficient documentation

## 2024-02-12 DIAGNOSIS — R519 Headache, unspecified: Secondary | ICD-10-CM | POA: Diagnosis present

## 2024-02-12 DIAGNOSIS — O26892 Other specified pregnancy related conditions, second trimester: Secondary | ICD-10-CM | POA: Diagnosis not present

## 2024-02-12 DIAGNOSIS — K0889 Other specified disorders of teeth and supporting structures: Secondary | ICD-10-CM

## 2024-02-12 DIAGNOSIS — O210 Mild hyperemesis gravidarum: Secondary | ICD-10-CM | POA: Diagnosis present

## 2024-02-12 LAB — URINALYSIS, ROUTINE W REFLEX MICROSCOPIC
Bilirubin Urine: NEGATIVE
Glucose, UA: NEGATIVE mg/dL
Hgb urine dipstick: NEGATIVE
Ketones, ur: NEGATIVE mg/dL
Leukocytes,Ua: NEGATIVE
Nitrite: NEGATIVE
Protein, ur: 30 mg/dL — AB
Specific Gravity, Urine: 1.03 (ref 1.005–1.030)
pH: 5 (ref 5.0–8.0)

## 2024-02-12 MED ORDER — OXYCODONE HCL 5 MG PO TABS
5.0000 mg | ORAL_TABLET | Freq: Once | ORAL | Status: AC
  Administered 2024-02-12: 5 mg via ORAL
  Filled 2024-02-12: qty 1

## 2024-02-12 MED ORDER — OXYCODONE HCL 5 MG PO TABS: 5.0000 mg | ORAL_TABLET | Freq: Three times a day (TID) | ORAL | 0 refills | Status: DC | PRN

## 2024-02-12 NOTE — MAU Provider Note (Signed)
 " History     [id number]  Arrival date and time: 02/12/24 1850    Chief Complaint  Patient presents with   Dental Pain   Headache     HPI Amy Downs is a 28 y.o. at [redacted]w[redacted]d who presents for tooth ache. She states that she has been having tooth problems for weeks secondary to her constant vomiting due to her hyperemesis. She didn't try to contact a dentist until recently but has been unable to get a hold of one due to the holidays. She states that she cannot take the pain anymore. She has been using ora-gel and tylenol  with only minimal relief. Pain is contained in the mouth. No neck pain or swelling. Denies contractions, VB, LOF.      A/Positive/-- (10/23 1035)  Past Medical History:  Diagnosis Date   Abscess    under arm- drained/cut. declines MRSA, states it was from sensitivity to deodorant   Anxiety    no meds   UTI (urinary tract infection)     Past Surgical History:  Procedure Laterality Date   CESAREAN SECTION     twin IUP with PreE   HERNIA REPAIR     WISDOM TOOTH EXTRACTION      Family History  Problem Relation Age of Onset   Stroke Mother 13   Heart disease Father    Hypertension Maternal Grandmother    COPD Maternal Grandmother    Kidney disease Paternal Grandmother     Social History   Socioeconomic History   Marital status: Single    Spouse name: Not on file   Number of children: Not on file   Years of education: Not on file   Highest education level: Not on file  Occupational History   Not on file  Tobacco Use   Smoking status: Never   Smokeless tobacco: Never  Vaping Use   Vaping status: Never Used  Substance and Sexual Activity   Alcohol use: Not Currently   Drug use: Not Currently    Types: Marijuana    Comment: hasn't smoked since found out she was pregnant   Sexual activity: Not Currently    Partners: Male    Birth control/protection: None  Other Topics Concern   Not on file  Social History Narrative   Not on file    Social Drivers of Health   Tobacco Use: Low Risk (12/22/2023)   Patient History    Smoking Tobacco Use: Never    Smokeless Tobacco Use: Never    Passive Exposure: Not on file  Financial Resource Strain: Not on File (07/12/2022)   Received from [employer]    Financial Resource Strain: 0  Food Insecurity: Food Insecurity Present (12/11/2023)   Epic    Worried About Programme Researcher, Broadcasting/film/video in the Last Year: Sometimes true    Ran Out of Food in the Last Year: Never true  Transportation Needs: No Transportation Needs (12/11/2023)   Epic    Lack of Transportation (Medical): No    Lack of Transportation (Non-Medical): No  Physical Activity: Not on File (07/12/2022)   Received from [facility]   Physical Activity    Physical Activity: 0  Stress: Not on File (07/12/2022)   Received from [facility]   Stress    Stress: 0  Social Connections: Not on File (10/23/2022)   Received from [facility]   Social Connections    Connectedness: 0  Intimate Partner Violence: Not At Risk (12/04/2023)   Epic  Fear of Current or Ex-Partner: No    Emotionally Abused: No    Physically Abused: No    Sexually Abused: No  Depression (PHQ2-9): Medium Risk (12/11/2023)   Depression (PHQ2-9)    PHQ-2 Score: 9  Alcohol Screen: Not on file  Housing: Low Risk (12/04/2023)   Epic    Unable to Pay for Housing in the Last Year: No    Number of Times Moved in the Last Year: 0    Homeless in the Last Year: No  Utilities: At Risk (12/04/2023)   Epic    Threatened with loss of utilities: Yes  Health Literacy: Not on file    Allergies[1]  Medications Ordered Prior to Encounter[2]  Pertinent positives and negative per HPI, all others reviewed and negative  Physical Exam   BP 115/67 (BP Location: Right Arm)   Pulse 96   Temp 98.6 F (37 C) (Oral)   Resp 16   Ht 5' 9 (1.753 m)   Wt 118.9 kg   LMP 09/22/2023 (Exact Date)   SpO2 100%   BMI 38.71 kg/m   Patient Vitals for the past 24 hrs:  BP  Temp Temp src Pulse Resp SpO2 Height Weight  02/12/24 1919 115/67 98.6 F (37 C) Oral 96 16 100 % 5' 9 (1.753 m) 118.9 kg    Physical Exam Vitals and nursing note reviewed.  Constitutional:      Appearance: She is well-developed.  HENT:     Head: Normocephalic and atraumatic.     Mouth/Throat:     Mouth: Mucous membranes are moist.     Comments: Mild erythema behind second molar on the right side. No swelling or evidence of infection.  Eyes:     Extraocular Movements: Extraocular movements intact.  Cardiovascular:     Rate and Rhythm: Normal rate and regular rhythm.  Pulmonary:     Effort: Pulmonary effort is normal.  Abdominal:     Palpations: Abdomen is soft.     Tenderness: There is no abdominal tenderness.  Skin:    Capillary Refill: Capillary refill takes less than 2 seconds.  Neurological:     General: No focal deficit present.     Mental Status: She is alert.    Labs No results found for this or any previous visit (from the past 24 hours).  Imaging No results found.  MAU Course  Procedures  Lab Orders         Urinalysis, Routine w reflex microscopic -Urine, Clean Catch    Meds ordered this encounter  Medications   oxyCODONE  (Oxy IR/ROXICODONE ) immediate release tablet 5 mg    Refill:  0   oxyCODONE  (ROXICODONE ) 5 MG immediate release tablet    Sig: Take 1 tablet (5 mg total) by mouth every 8 (eight) hours as needed.    Dispense:  15 tablet    Refill:  0   Imaging Orders  No imaging studies ordered today    MDM Low (Level 2)  Assessment and Plan  Tooth ache  [redacted] weeks gestation of pregnancy  Amy Downs is a 28 y.o. at [redacted]w[redacted]d who presents for tooth ache.  -Has tried ora-gel and tylenol  at home with minimal relief -No evidence of infection on physical exam, patient is afebrile. -Patient states she just needs something else for the pain. -5mg  oxycodone  given in the MAU with moderate relief in pain.   -Stable for discharge  home -Encouraged patient to establish care with a dentist -Rx for short course of  oxycodone  sent to patient pharmacy.  -Return precautions provided.        Amy Downs L Dawnmarie Breon, MD/MHA 02/12/2024 8:16 PM  Allergies as of 02/12/2024       Reactions   Pollen Extract Itching        Medication List     STOP taking these medications    cyclobenzaprine  10 MG tablet Commonly known as: FLEXERIL        TAKE these medications    acetaminophen -caffeine  500-65 MG Tabs per tablet Commonly known as: EXCEDRIN TENSION HEADACHE Take 2 tablets by mouth every 6 (six) hours as needed (headache).   methylPREDNISolone  4 MG tablet Commonly known as: Medrol  Take 4 tablets by mouth (16 mg total) in the morning AND bedtime AND 2 tablets (8 mg total) at midday for 1 day. THEN 4 tablets (16 mg total) in the morning AND 2 tablets (8 mg total) at midday and bedtime for 1 day. THEN 2 tablets (8 mg total) 3 (three) times daily for 1 day. THEN 2 tablets (8 mg total) morning AND bedtime AND 1 tablet (4 mg total) midday for 1 day. THEN 2 tablets (8 mg total) morning AND 1 tablet (4 mg total) midday and bedtime for 1 day. THEN 2 tablets (8 mg total) morning AND 1 tablet (4 mg total) midday for 2 days. THEN 2 tablets (8 mg total) morning for 2 days. THEN 1 tablet (4 mg total) morning for 2 days.   metoCLOPramide  10 MG tablet Commonly known as: REGLAN  Take 1 tablet (10 mg total) by mouth every 6 (six) hours as needed (nausea or headache).   ondansetron  4 MG disintegrating tablet Commonly known as: ZOFRAN -ODT Take 1-2 tablets (4-8 mg total) by mouth every 8 (eight) hours as needed for nausea, vomiting or refractory nausea / vomiting.   oxyCODONE  5 MG immediate release tablet Commonly known as: Roxicodone  Take 1 tablet (5 mg total) by mouth every 8 (eight) hours as needed.   pantoprazole  40 MG tablet Commonly known as: Protonix  Take 1 tablet (40 mg total) by mouth 2 (two) times daily.   polyethylene  glycol powder 17 GM/SCOOP powder Commonly known as: GLYCOLAX /MIRALAX  Use once daily per package instructions.   scopolamine  1 MG/3DAYS Commonly known as: TRANSDERM-SCOP Place 1 patch onto the skin every 3 (three) days.           [1]  Allergies Allergen Reactions   Pollen Extract Itching  [2]  No current facility-administered medications on file prior to encounter.   Current Outpatient Medications on File Prior to Encounter  Medication Sig Dispense Refill   metoCLOPramide  (REGLAN ) 10 MG tablet Take 1 tablet (10 mg total) by mouth every 6 (six) hours as needed (nausea or headache). 60 tablet 0   ondansetron  (ZOFRAN -ODT) 4 MG disintegrating tablet Take 1-2 tablets (4-8 mg total) by mouth every 8 (eight) hours as needed for nausea, vomiting or refractory nausea / vomiting. 30 tablet 0   scopolamine  (TRANSDERM-SCOP) 1 MG/3DAYS Place 1 patch onto the skin every 3 (three) days.     acetaminophen -caffeine  (EXCEDRIN TENSION HEADACHE) 500-65 MG TABS per tablet Take 2 tablets by mouth every 6 (six) hours as needed (headache). 60 tablet 0   cyclobenzaprine  (FLEXERIL ) 10 MG tablet Take 1 tablet (10 mg total) by mouth 2 (two) times daily as needed (headache). (Patient not taking: Reported on 01/12/2024) 20 tablet 0   methylPREDNISolone  (MEDROL ) 4 MG tablet Take 4 tablets by mouth (16 mg total) in the morning AND bedtime AND 2 tablets (8  mg total) at midday for 1 day. THEN 4 tablets (16 mg total) in the morning AND 2 tablets (8 mg total) at midday and bedtime for 1 day. THEN 2 tablets (8 mg total) 3 (three) times daily for 1 day. THEN 2 tablets (8 mg total) morning AND bedtime AND 1 tablet (4 mg total) midday for 1 day. THEN 2 tablets (8 mg total) morning AND 1 tablet (4 mg total) midday and bedtime for 1 day. THEN 2 tablets (8 mg total) morning AND 1 tablet (4 mg total) midday for 2 days. THEN 2 tablets (8 mg total) morning for 2 days. THEN 1 tablet (4 mg total) morning for 2 days. 45 tablet 0    pantoprazole  (PROTONIX ) 40 MG tablet Take 1 tablet (40 mg total) by mouth 2 (two) times daily. 60 tablet 1   polyethylene glycol powder (GLYCOLAX /MIRALAX ) 17 GM/SCOOP powder Use once daily per package instructions. 850 g 11   "

## 2024-02-12 NOTE — MAU Note (Signed)
..  Amy Downs is a 28 y.o. at [redacted]w[redacted]d here in MAU reporting: 3 days ago gums began to hurt and swell, causing the rest of mouth and face to hurt. Reports the pain shoots to ears and head.  For the pain took tylenol  around 2pm and oragel around 2pm none have helped  Denies vaginal bleeding, leaking of fluid, or lower abdominal pain.  Pain score: 10/10 Vitals:   02/12/24 1919  BP: 115/67  Pulse: 96  Resp: 16  Temp: 98.6 F (37 C)  SpO2: 100%     FHT:145 Lab orders placed from triage: UA

## 2024-02-15 ENCOUNTER — Ambulatory Visit (HOSPITAL_COMMUNITY)
Admission: EM | Admit: 2024-02-15 | Discharge: 2024-02-15 | Disposition: A | Discharge status: Home / Self Care | Attending: Nurse Practitioner | Admitting: Nurse Practitioner

## 2024-02-15 ENCOUNTER — Encounter (HOSPITAL_COMMUNITY): Payer: Self-pay

## 2024-02-15 DIAGNOSIS — K0889 Other specified disorders of teeth and supporting structures: Secondary | ICD-10-CM | POA: Diagnosis not present

## 2024-02-15 DIAGNOSIS — K0510 Chronic gingivitis, plaque induced: Secondary | ICD-10-CM | POA: Diagnosis not present

## 2024-02-15 DIAGNOSIS — Z3A20 20 weeks gestation of pregnancy: Secondary | ICD-10-CM

## 2024-02-15 MED ORDER — AMOXICILLIN 875 MG PO TABS: 875.0000 mg | ORAL_TABLET | Freq: Two times a day (BID) | ORAL | 0 refills | Status: AC

## 2024-02-15 MED ORDER — CHLORHEXIDINE GLUCONATE 0.12 % MT SOLN: 15.0000 mL | OROMUCOSAL | 0 refills | Status: AC

## 2024-02-15 NOTE — ED Triage Notes (Signed)
 Patient here today with c/o dental abscess on the bottom right side X 5 days. Patient is [redacted] weeks pregnant tomorrow. Patient states that she has a couple cracked teeth and has been vomiting due to the pregnancy and thinks that may have caused her abscess. Patient states that she was to the ED a few days ago but was only given Oxycodone  and that is not helping.

## 2024-02-15 NOTE — ED Provider Notes (Signed)
 " [facility]    CSN: [id number] Arrival date & time: 02/15/24  0809      History   Chief Complaint Chief Complaint  Patient presents with   Dental Problem    HPI Amy Downs is a 28 y.o. female.   Discussed the use of AI scribe software for clinical note transcription with the patient, who gave verbal consent to proceed.   The patient is a 20-week, 6-day pregnant female who presents with right lower dental pain that has been ongoing since December 23rd. She reports a history of cracked teeth and believes recurrent pregnancy-related vomiting has exacerbated her dental pain. She was evaluated in the emergency department on Christmas Day for this complaint and was prescribed oxycodone , which she reports has provided no relief and causes nausea. She has been using Orajel and acetaminophen  for pain control with minimal improvement. She denies any drainage from the affected area, as well as fever or facial swelling. The pain radiates to the right ear and head and extends down into the right side of the neck. She denies sore throat, neck swelling, dysphagia, or odynophagia. The patient does not have an established dentist and reports difficulty obtaining dental care due to office closures during the holiday period.  The following sections of the patient's history were reviewed and updated as appropriate: allergies, current medications, past family history, past medical history, past social history, past surgical history, and problem list.     Past Medical History:  Diagnosis Date   Abscess    under arm- drained/cut. declines MRSA, states it was from sensitivity to deodorant   Anxiety    no meds   UTI (urinary tract infection)     Patient Active Problem List   Diagnosis Date Noted   Dehydration 12/10/2023   Supervision of high risk pregnancy, antepartum 12/09/2023   Hyperemesis gravidarum 11/29/2023   History of severe pre-eclampsia 11/10/2023   History of  hyperemesis gravidarum 11/10/2023   History of C-section 01/04/2015    Past Surgical History:  Procedure Laterality Date   CESAREAN SECTION     twin IUP with PreE   HERNIA REPAIR     WISDOM TOOTH EXTRACTION      OB History     Gravida  3   Para  2   Term  1   Preterm  1   AB  0   Living  3      SAB  0   IAB  0   Ectopic  0   Multiple  1   Live Births  3            Home Medications    Prior to Admission medications  Medication Sig Start Date End Date Taking? Authorizing Provider  amoxicillin  (AMOXIL ) 875 MG tablet Take 1 tablet (875 mg total) by mouth 2 (two) times daily for 7 days. 02/15/24 02/22/24 Yes Iola Lukes, FNP  chlorhexidine  (PERIDEX ) 0.12 % solution Use as directed 15 mLs in the mouth or throat 2 (two) times daily at 8 am and 6 pm for 7 days. Brush teeth then swish in mouth for 2 minutes then spit out. Do not rinse mouth after use. Avoid eating, drinking, smoking and vaping for at least 30 minutes after use 02/15/24 02/22/24 Yes Nickole Adamek, Lukes, FNP  acetaminophen -caffeine  (EXCEDRIN TENSION HEADACHE) 500-65 MG TABS per tablet Take 2 tablets by mouth every 6 (six) hours as needed (headache). 12/22/23   Wallace Joesph LABOR, PA  methylPREDNISolone  (MEDROL ) 4 MG  tablet Take 4 tablets by mouth (16 mg total) in the morning AND bedtime AND 2 tablets (8 mg total) at midday for 1 day. THEN 4 tablets (16 mg total) in the morning AND 2 tablets (8 mg total) at midday and bedtime for 1 day. THEN 2 tablets (8 mg total) 3 (three) times daily for 1 day. THEN 2 tablets (8 mg total) morning AND bedtime AND 1 tablet (4 mg total) midday for 1 day. THEN 2 tablets (8 mg total) morning AND 1 tablet (4 mg total) midday and bedtime for 1 day. THEN 2 tablets (8 mg total) morning AND 1 tablet (4 mg total) midday for 2 days. THEN 2 tablets (8 mg total) morning for 2 days. THEN 1 tablet (4 mg total) morning for 2 days. 12/05/23   Cleatus Moccasin, MD  metoCLOPramide  (REGLAN ) 10 MG  tablet Take 1 tablet (10 mg total) by mouth every 6 (six) hours as needed (nausea or headache). 12/22/23   Wallace Joesph LABOR, PA  ondansetron  (ZOFRAN -ODT) 4 MG disintegrating tablet Take 1-2 tablets (4-8 mg total) by mouth every 8 (eight) hours as needed for nausea, vomiting or refractory nausea / vomiting. 12/05/23   Cleatus Moccasin, MD  oxyCODONE  (ROXICODONE ) 5 MG immediate release tablet Take 1 tablet (5 mg total) by mouth every 8 (eight) hours as needed. Patient not taking: Reported on 02/15/2024 02/12/24 02/11/25  Cashion, Colter L, MD  pantoprazole  (PROTONIX ) 40 MG tablet Take 1 tablet (40 mg total) by mouth 2 (two) times daily. 01/12/24   Arnold, James G, MD  polyethylene glycol powder (GLYCOLAX /MIRALAX ) 17 GM/SCOOP powder Use once daily per package instructions. 11/04/23   Wallace Joesph LABOR, PA  scopolamine  (TRANSDERM-SCOP) 1 MG/3DAYS Place 1 patch onto the skin every 3 (three) days.    [provider]    Family History Family History  Problem Relation Age of Onset   Stroke Mother 15   Heart disease Father    Hypertension Maternal Grandmother    COPD Maternal Grandmother    Kidney disease Paternal Grandmother     Social History Social History[1]   Allergies   Pollen extract   Review of Systems Review of Systems  Constitutional:  Negative for fever.  HENT:  Positive for dental problem. Negative for facial swelling and sore throat.   Neurological:  Positive for headaches.  All other systems reviewed and are negative.    Physical Exam Triage Vital Signs ED Triage Vitals  Encounter Vitals Group     BP 02/15/24 0851 105/77     Girls Systolic BP Percentile --      Girls Diastolic BP Percentile --      Boys Systolic BP Percentile --      Boys Diastolic BP Percentile --      Pulse Rate 02/15/24 0851 (!) 107     Resp 02/15/24 0851 16     Temp 02/15/24 0851 98.4 F (36.9 C)     Temp Source 02/15/24 0851 Oral     SpO2 02/15/24 0851 94 %     Weight --      Height  --      Head Circumference --      Peak Flow --      Pain Score 02/15/24 0848 10     Pain Loc --      Pain Education --      Exclude from Growth Chart --    No data found.  Updated Vital Signs BP 105/77 (BP Location: Left Arm)  Pulse (!) 107   Temp 98.4 F (36.9 C) (Oral)   Resp 16   LMP 09/22/2023 (Exact Date)   SpO2 94%   Visual Acuity Right Eye Distance:   Left Eye Distance:   Bilateral Distance:    Right Eye Near:   Left Eye Near:    Bilateral Near:     Physical Exam Vitals reviewed.  Constitutional:      General: She is awake. She is not in acute distress.    Appearance: Normal appearance. She is well-developed. She is not ill-appearing, toxic-appearing or diaphoretic.     Comments: Patient tearful due to mouth pain but not acutely ill appearing or in any obvious distress   HENT:     Head: Normocephalic.     Right Ear: Hearing normal.     Left Ear: Hearing normal.     Nose: Nose normal.     Mouth/Throat:     Mouth: Mucous membranes are moist. No oral lesions.     Dentition: Dental tenderness, gingival swelling and dental caries present. No dental abscesses or gum lesions.     Pharynx: Oropharynx is clear. Uvula midline.     Comments: Localized gingival erythema and swelling are noted surrounding the right lower molars, with visible dental caries present. No gingival lesions or evidence of dental abscess noted.   Eyes:     General: Vision grossly intact.     Conjunctiva/sclera: Conjunctivae normal.  Cardiovascular:     Rate and Rhythm: Normal rate and regular rhythm.     Heart sounds: Normal heart sounds.  Pulmonary:     Effort: Pulmonary effort is normal.     Breath sounds: Normal breath sounds and air entry.  Musculoskeletal:        General: Normal range of motion.     Cervical back: Normal range of motion and neck supple.  Skin:    General: Skin is warm and dry.  Neurological:     General: No focal deficit present.     Mental Status: She is alert  and oriented to person, place, and time.  Psychiatric:        Speech: Speech normal.        Behavior: Behavior is cooperative.      UC Treatments / Results  Labs (all labs ordered are listed, but only abnormal results are displayed) Labs Reviewed - No data to display  EKG   Radiology No results found.  Procedures Procedures (including critical care time)  Medications Ordered in UC Medications - No data to display  Initial Impression / Assessment and Plan / UC Course  I have reviewed the triage vital signs and the nursing notes.  Pertinent labs & imaging results that were available during my care of the patient were reviewed by me and considered in my medical decision making (see chart for details).     The patient is a 20-week pregnant female presenting with persistent right lower dental pain and gingival inflammation without systemic symptoms. History and examination are notable for cracked teeth with localized gingival erythema and swelling surrounding the right first and second molars, without evidence of dental abscess, facial swelling, fever, or airway compromise. Clinical presentation is most consistent with dental pain secondary to gingival inflammation, likely exacerbated by pregnancy-related vomiting. Given pregnancy status and localized findings, treatment was initiated with amoxicillin  twice daily for seven days and chlorhexidine  (Peridex ) mouth rinse twice daily. The patient was advised to continue acetaminophen  and may use previously prescribed oxycodone  sparingly as directed  by the emergency department, as NSAIDs are contraindicated in pregnancy. Supportive care including warm compresses, soft foods, warm liquids, avoidance of straws and hard or sticky foods, and warm saltwater gargles was reviewed. The patient was provided with urgent dental referrals and instructed to arrange prompt dental evaluation for definitive management. She was advised to follow up with her  primary care provider or obstetric provider as needed, and to seek emergency care for worsening pain, development of facial or neck swelling, fever, drainage, difficulty swallowing or breathing, or inability to tolerate oral intake.  Today's evaluation has revealed no signs of a dangerous process. Discussed diagnosis with patient and/or guardian. Patient and/or guardian aware of their diagnosis, possible red flag symptoms to watch out for and need for close follow up. Patient and/or guardian understands verbal and written discharge instructions. Patient and/or guardian comfortable with plan and disposition.  Patient and/or guardian has a clear mental status at this time, good insight into illness (after discussion and teaching) and has clear judgment to make decisions regarding their care  Documentation was completed with the aid of voice recognition software. Transcription may contain typographical errors.  Final Clinical Impressions(s) / UC Diagnoses   Final diagnoses:  Dentalgia  Localized gingivitis  [redacted] weeks gestation of pregnancy     Discharge Instructions      You were seen today for right-sided dental pain with gum inflammation during pregnancy. At this time, there are no signs of a dental abscess or serious infection, but the irritated gums and cracked teeth are likely causing your pain and discomfort.  Take the amoxicillin  exactly as prescribed until the full course is completed, even if your pain improves. Use the Peridex  (chlorhexidine ) mouth rinse twice daily as directed to help reduce gum inflammation and bacteria. For pain control, you may continue using acetaminophen  as needed and may use the oxycodone  previously prescribed by the emergency department sparingly if pain is severe, as directed. Avoid anti-inflammatory medications such as ibuprofen  during pregnancy. Applying warm compresses to the affected side of the face may help relieve discomfort. Choose a soft food diet, warm  liquids, and avoid hard, crunchy, or sticky foods as well as using straws. Gentle warm saltwater rinses several times a day may also help soothe the gums.  It is important to follow up urgently with a dentist for definitive evaluation and treatment. You were provided with a list of dental offices to contact for an urgent appointment. Go to the emergency department immediately if you develop fever, increasing facial or neck swelling, drainage or pus from the gums, worsening pain despite treatment, difficulty swallowing or breathing, inability to open your mouth, or if you are unable to keep fluids or medications down.      ED Prescriptions     Medication Sig Dispense Auth. Provider   chlorhexidine  (PERIDEX ) 0.12 % solution Use as directed 15 mLs in the mouth or throat 2 (two) times daily at 8 am and 6 pm for 7 days. Brush teeth then swish in mouth for 2 minutes then spit out. Do not rinse mouth after use. Avoid eating, drinking, smoking and vaping for at least 30 minutes after use 210 mL Mahima Hottle, FNP   amoxicillin  (AMOXIL ) 875 MG tablet Take 1 tablet (875 mg total) by mouth 2 (two) times daily for 7 days. 14 tablet Iola Lukes, FNP      PDMP not reviewed this encounter.     [1]  Social History Tobacco Use   Smoking status: Never  Smokeless tobacco: Never  Vaping Use   Vaping status: Never Used  Substance Use Topics   Alcohol use: Not Currently   Drug use: Not Currently    Types: Marijuana    Comment: hasn't smoked since found out she was pregnant     Iola Lukes, [place] 02/15/24 0935  "

## 2024-02-15 NOTE — Discharge Instructions (Addendum)
 You were seen today for right-sided dental pain with gum inflammation during pregnancy. At this time, there are no signs of a dental abscess or serious infection, but the irritated gums and cracked teeth are likely causing your pain and discomfort.  Take the amoxicillin  exactly as prescribed until the full course is completed, even if your pain improves. Use the Peridex  (chlorhexidine ) mouth rinse twice daily as directed to help reduce gum inflammation and bacteria. For pain control, you may continue using acetaminophen  as needed and may use the oxycodone  previously prescribed by the emergency department sparingly if pain is severe, as directed. Avoid anti-inflammatory medications such as ibuprofen  during pregnancy. Applying warm compresses to the affected side of the face may help relieve discomfort. Choose a soft food diet, warm liquids, and avoid hard, crunchy, or sticky foods as well as using straws. Gentle warm saltwater rinses several times a day may also help soothe the gums.  It is important to follow up urgently with a dentist for definitive evaluation and treatment. You were provided with a list of dental offices to contact for an urgent appointment. Go to the emergency department immediately if you develop fever, increasing facial or neck swelling, drainage or pus from the gums, worsening pain despite treatment, difficulty swallowing or breathing, inability to open your mouth, or if you are unable to keep fluids or medications down.

## 2024-02-16 ENCOUNTER — Other Ambulatory Visit: Payer: Self-pay

## 2024-02-16 ENCOUNTER — Ambulatory Visit: Admitting: Obstetrics and Gynecology

## 2024-02-16 VITALS — BP 121/79 | HR 110 | Wt 262.5 lb

## 2024-02-16 DIAGNOSIS — Z3A21 21 weeks gestation of pregnancy: Secondary | ICD-10-CM

## 2024-02-16 DIAGNOSIS — O210 Mild hyperemesis gravidarum: Secondary | ICD-10-CM | POA: Diagnosis not present

## 2024-02-16 DIAGNOSIS — Z98891 History of uterine scar from previous surgery: Secondary | ICD-10-CM | POA: Insufficient documentation

## 2024-02-16 DIAGNOSIS — O0992 Supervision of high risk pregnancy, unspecified, second trimester: Secondary | ICD-10-CM

## 2024-02-16 DIAGNOSIS — Z8759 Personal history of other complications of pregnancy, childbirth and the puerperium: Secondary | ICD-10-CM | POA: Diagnosis not present

## 2024-02-16 DIAGNOSIS — O0990 Supervision of high risk pregnancy, unspecified, unspecified trimester: Secondary | ICD-10-CM

## 2024-02-16 MED ORDER — ASPIRIN 81 MG PO CHEW: 81.0000 mg | CHEWABLE_TABLET | Freq: Every day | ORAL | 2 refills | Status: AC

## 2024-02-16 NOTE — Progress Notes (Signed)
" ° °  PRENATAL VISIT NOTE  Subjective:  Amy Downs is a 28 y.o. [phone] at [redacted]w[redacted]d being seen today for ongoing prenatal care.  She is currently monitored for the following issues for this high-risk pregnancy and has History of C-section; History of severe pre-eclampsia; History of hyperemesis gravidarum; Hyperemesis gravidarum; Supervision of high risk pregnancy, antepartum; Dehydration; and History of VBAC on their problem list.  Patient doing well with no acute concerns today. She reports no complaints.  Contractions: Not present. Vag. Bleeding: None.  Movement: Present. Denies leaking of fluid.   The following portions of the patient's history were reviewed and updated as appropriate: allergies, current medications, past family history, past medical history, past social history, past surgical history and problem list. Problem list updated.  Objective:   Vitals:   02/16/24 1447  BP: 121/79  Pulse: (!) 110  Weight: 262 lb 8 oz (119.1 kg)    Fetal Status: Fetal Heart Rate (bpm): 143   Movement: Present     General:  Alert, oriented and cooperative. Patient is in no acute distress.  Skin: Skin is warm and dry. No rash noted.   Cardiovascular: Normal heart rate noted  Respiratory: Normal respiratory effort, no problems with respiration noted  Abdomen: Soft, gravid, appropriate for gestational age.  Pain/Pressure: Absent     Pelvic: Cervical exam deferred        Extremities: Normal range of motion.  Edema: None  Mental Status:  Normal mood and affect. Normal behavior. Normal judgment and thought content.   Assessment and Plan:  Pregnancy: G3P1103 at [redacted]w[redacted]d  1. [redacted] weeks gestation of pregnancy (Primary)  - AFP, Serum, Open Spina Bifida  2. Hyperemesis gravidarum Pt notes nausea still pregnant, but much improved from previous  3. Supervision of high risk pregnancy, antepartum Continue routine prenatal care Of note, pt states she has never had HSV or an outbreak, she gave a  misrepresentation when she was much younger to acquire antivirals for a friend.  She never actually had the condition  4. History of severe pre-eclampsia Pt advised to start prophylaxis, but hold for 5-7 days prior to aggressive dental procedures - aspirin 81 MG chewable tablet; Chew 1 tablet (81 mg total) by mouth daily.  Dispense: 90 tablet; Refill: 2  5. History of C-section   6. History of VBAC Pt has had successful VBAC and desires another TOLAC Consent not signed today  Preterm labor symptoms and general obstetric precautions including but not limited to vaginal bleeding, contractions, leaking of fluid and fetal movement were reviewed in detail with the patient.  Please refer to After Visit Summary for other counseling recommendations.   Return in about 4 weeks (around 03/15/2024) for [facility], in person.   Jerilynn Buddle, MD Faculty Attending Center for [facility]   "

## 2024-02-16 NOTE — Addendum Note (Signed)
 Addended by: ROSALYNN SHARLET RAMAN on: 02/16/2024 03:26 PM   Modules accepted: Orders

## 2024-02-16 NOTE — Patient Instructions (Signed)
 Considering Waterbirth? Guide for patients at Center for [employer] [facility]) Why consider waterbirth? Gentle birth for babies  Less pain medicine used in labor  May allow for passive descent/less pushing  May reduce perineal tears  More mobility and instinctive maternal position changes  Increased maternal relaxation   Is waterbirth safe? What are the risks of infection, drowning or other complications? Infection:  Very low risk (3.7 % for tub vs 4.8% for bed)  7 in 8000 waterbirths with documented infection  Poorly cleaned equipment most common cause  Slightly lower group B strep transmission rate  Drowning  Maternal:  Very low risk  Related to seizures or fainting  Newborn:  Very low risk. No evidence of increased risk of respiratory problems in multiple large studies  Physiological protection from breathing under water  Avoid underwater birth if there are any fetal complications  Once baby's head is out of the water, keep it out.  Birth complication  Some reports of cord trauma, but risk decreased by bringing baby to surface gradually  No evidence of increased risk of shoulder dystocia. Mothers can usually change positions faster in water than in a bed, possibly aiding the maneuvers to free the shoulder.   There are 2 things you MUST do to have a waterbirth with [facility]: Attend a waterbirth class at [facility] at [facility]   3rd Wednesday of every month from 7-9 pm (virtual during COVID) [employer] at [email or web address] or [email or web address] or by calling [phone] Bring [place] the certificate from the class to your prenatal appointment or send via MyChart Meet with a midwife at 36 weeks* to see if you can still plan a waterbirth and to sign the consent.   *We also recommend that you schedule as many of your prenatal visits with a midwife as possible.    Helpful information: You may want to bring a bathing suit top to the hospital  to wear during labor but this is optional.  All other supplies are provided by the hospital. Please arrive at the hospital with signs of active labor, and do not wait at home until late in labor. It takes 45 min- 1 hour for fetal monitoring, and check in to your room to take place, plus transport and filling of the waterbirth tub.    Things that would prevent you from having a waterbirth: Premature, <37wks  Previous cesarean birth  Presence of thick meconium-stained fluid  Multiple gestation (Twins, triplets, etc.)  Uncontrolled diabetes or gestational diabetes requiring medication  Hypertension diagnosed in pregnancy or preexisting hypertension (gestational hypertension, preeclampsia, or chronic hypertension) Fetal growth restriction (your baby measures less than 10th percentile on ultrasound) Heavy vaginal bleeding  Non-reassuring fetal heart rate  Active infection (MRSA, etc.). Group B Strep is NOT a contraindication for waterbirth.  If your labor has to be induced and induction method requires continuous monitoring of the baby's heart rate  Other risks/issues identified by your obstetrical provider   Please remember that birth is unpredictable. Under certain unforeseeable circumstances your provider may advise against giving birth in the tub. These decisions will be made on a case-by-case basis and with the safety of you and your baby as our highest priority.    Updated 05/23/21 Options for Doula Care in the [place]  As you review your birthing options, consider having a birth doula. A doula is trained to provide support before, during and just after you give birth. There are also postpartum doulas that help you  adjust to new parenthood.  While doulas do not provide medical care, they do provide emotional, physical and educational support. A few months before your baby arrives, doulas can help answer questions, ease concerns and help you create and support your birthing plan.     Doulas can help reduce your stress and comfort you and your partner. They can help you cope with labor by helping you use breathing techniques, massage, creative labor positioning, essential oils and affirmations.   Studies show that the benefits of having a doula include:   A more positive birth experience  Fewer requests for pain-relief medication  Less likelihood of cesarean section, commonly called a c-section   Doulas are typically hired via a [employer] account planner between you and the doula. We are happy to provide a list of the most active doulas in the area, all of whom are credentialed by [facility] and will not count as a visitor at your birth.  There are several options for no-cost doula care at our hospital, including:  [facility] Volunteer Doula Program Every [employer] Program A Cure 4 Moms Doula Study (available only at [employer] for Women, [place], [place] and [employer] [facility] offices)  For more information on these programs or to receive a list of doulas active in our area, please email [email or web address]

## 2024-02-17 ENCOUNTER — Other Ambulatory Visit

## 2024-02-17 ENCOUNTER — Ambulatory Visit

## 2024-02-17 LAB — AFP, SERUM, OPEN SPINA BIFIDA
AFP MoM: 0.97
AFP Value: 49.2 ng/mL
Gest. Age on Collection Date: 21 wk
Maternal Age At EDD: 30.3 a
OSBR Risk 1 IN: 10000
Test Results:: NEGATIVE
Weight: 262 [lb_av]

## 2024-02-19 ENCOUNTER — Encounter (HOSPITAL_COMMUNITY): Payer: Self-pay | Admitting: Certified Nurse Midwife

## 2024-02-20 ENCOUNTER — Other Ambulatory Visit: Payer: Self-pay | Admitting: *Deleted

## 2024-02-20 ENCOUNTER — Ambulatory Visit: Payer: Self-pay | Admitting: Obstetrics and Gynecology

## 2024-02-20 ENCOUNTER — Ambulatory Visit

## 2024-02-20 ENCOUNTER — Ambulatory Visit: Attending: Obstetrics and Gynecology | Admitting: Obstetrics and Gynecology

## 2024-02-20 VITALS — BP 102/63

## 2024-02-20 DIAGNOSIS — Z36 Encounter for antenatal screening for chromosomal anomalies: Secondary | ICD-10-CM | POA: Insufficient documentation

## 2024-02-20 DIAGNOSIS — O99212 Obesity complicating pregnancy, second trimester: Secondary | ICD-10-CM

## 2024-02-20 DIAGNOSIS — O210 Mild hyperemesis gravidarum: Secondary | ICD-10-CM | POA: Diagnosis not present

## 2024-02-20 DIAGNOSIS — O34219 Maternal care for unspecified type scar from previous cesarean delivery: Secondary | ICD-10-CM

## 2024-02-20 DIAGNOSIS — Z8759 Personal history of other complications of pregnancy, childbirth and the puerperium: Secondary | ICD-10-CM | POA: Insufficient documentation

## 2024-02-20 DIAGNOSIS — E669 Obesity, unspecified: Secondary | ICD-10-CM

## 2024-02-20 DIAGNOSIS — Z7982 Long term (current) use of aspirin: Secondary | ICD-10-CM | POA: Diagnosis not present

## 2024-02-20 DIAGNOSIS — O09292 Supervision of pregnancy with other poor reproductive or obstetric history, second trimester: Secondary | ICD-10-CM | POA: Diagnosis not present

## 2024-02-20 DIAGNOSIS — O0992 Supervision of high risk pregnancy, unspecified, second trimester: Secondary | ICD-10-CM | POA: Insufficient documentation

## 2024-02-20 DIAGNOSIS — Z362 Encounter for other antenatal screening follow-up: Secondary | ICD-10-CM

## 2024-02-20 DIAGNOSIS — Z3A21 21 weeks gestation of pregnancy: Secondary | ICD-10-CM

## 2024-02-20 DIAGNOSIS — O0990 Supervision of high risk pregnancy, unspecified, unspecified trimester: Secondary | ICD-10-CM

## 2024-02-20 NOTE — Progress Notes (Signed)
 Maternal-Fetal Medicine Consultation  Name: Amy Downs  MRN: [id number]  GA: [id number] [redacted]w[redacted]d   Patient is here for fetal anatomy scan. On cell-free fetal DNA screening, the risks of fetal aneuploidies are not increased.  MSAFP screening showed low risk for open neural tube defects. Obstetric history -2016: Preterm cesarean delivery at [redacted] weeks gestation of dichorionic-diamniotic twins.  Both her children are in good health.  Her pregnancy was complicated by preeclampsia.  Patient takes low-dose aspirin prophylaxis. - 2017: Term vaginal delivery (VBAC).  Ultrasound Fetal biometry is consistent with the previously established dates.  Normal amniotic fluid.  No markers of aneuploidies or obvious fetal structural defects are seen. Placenta is anterior and there is no evidence of previa or placenta accreta spectrum. Patient understands the limitations of ultrasound in detecting fetal anomalies.  Previous cesarean delivery I reassured the patient of normally located placenta with no evidence of previa.  Repeat cesarean deliveries increase the risks of placenta previa and/are placenta accreta spectrum. Patient had a successful VBAC.  She is 5 feet 10 inches tall.  I counseled the patient that she is more likely to have a successful vaginal delivery.  The risk of uterine scar dehiscence during labor is 1%. Patient is keen on VBAC.  History of preeclampsia Recurrence rate of preeclampsia is about 25% to 50%.  She has a different partner and that could influence the incidence of preeclampsia.  I discussed the benefit of low-dose aspirin that delays or prevents preeclampsia.  Recommendations - Fetal growth assessment and completion of fetal anatomy in 4 weeks. - Fetal growth assessment at [redacted] weeks gestation.     Consultation including face-to-face (more than 50%) counseling 30 minutes.

## 2024-03-02 ENCOUNTER — Telehealth: Payer: Self-pay | Admitting: General Practice

## 2024-03-02 ENCOUNTER — Encounter: Payer: Self-pay | Admitting: General Practice

## 2024-03-02 NOTE — Telephone Encounter (Signed)
 Patient called into office stating she woke up this morning and noticed a jelly like discharge that was slightly yellow and when she googled it said it was her mucous plug. She has also noticed some mild cramping on her left side rated a 1-2 but seems to have improved some after using the bathroom. No itching, odor or bleeding reported. Discussed monitoring symptoms for now as nothing sounds alarming at this time and reviewed signs of when to go to MAU. Advised I was available all day in office so she is welcome to call back or send us  a message if things change. Patient verbalized understanding.

## 2024-03-11 NOTE — Patient Instructions (Signed)
  The 2 Hour Glucose Tolerance Test  A two-hour glucose tolerance test is a screening for gestational diabetes during pregnancy. It involves fasting, drinking a glucose (sugar) solution, and having blood drawn multiple times.   How to Prepare: Eat normally in the days before the test.   Eat something with a good bit of protein the night before the test - this can be done up to midnight. Think nuts, meat, cheeses.  Avoid food and drink for at least 8 to 12 hours before the test, except for a few sips of water. The test is usually scheduled in the morning so this means no food or drink (except small sips of water or medicines) after midnight the night before.  Do not chew gum (even sugar-free), have candy, drink coffee or tea.   Tell your healthcare provider about all medications, herbs, vitamins, and supplements you take.   Test procedure:  Have blood drawn to check your blood sugar level after fasting. Drink a liquid that contains 75 grams of glucose. Have blood drawn again every 60 minutes for two hours.  Abnormal results: Abnormal blood values for a two-hour glucose tolerance test include:   Fasting: greater than or equal to 92 mg/dL (5.1 mmol/L) 1 hour: greater than or equal to 180 mg/dL (89.9 mmol/L) 2 hour: greater than or equal to 153 mg/dL (8.5 mmol/L)  When it's performed: Most pregnant patients have a glucose screening test between 24 and 28 weeks of pregnancy. The test is sometimes done early if you have a higher risk for diabetes.

## 2024-03-11 NOTE — Progress Notes (Signed)
 Rescheduled due to weather.   Camie Rote, MSN, CNM, RNC-OB Certified Nurse Midwife, [facility] 03/17/2024 11:19 AM

## 2024-03-15 ENCOUNTER — Encounter: Payer: Self-pay | Admitting: Physician Assistant

## 2024-03-16 ENCOUNTER — Ambulatory Visit: Admitting: Certified Nurse Midwife

## 2024-03-16 DIAGNOSIS — Z98891 History of uterine scar from previous surgery: Secondary | ICD-10-CM

## 2024-03-16 DIAGNOSIS — Z8759 Personal history of other complications of pregnancy, childbirth and the puerperium: Secondary | ICD-10-CM

## 2024-03-16 DIAGNOSIS — Z3A25 25 weeks gestation of pregnancy: Secondary | ICD-10-CM

## 2024-03-16 DIAGNOSIS — O0990 Supervision of high risk pregnancy, unspecified, unspecified trimester: Secondary | ICD-10-CM

## 2024-03-17 NOTE — Patient Instructions (Signed)
 Labor after Cesarean is a safe alternative to repeat (scheduled) cesarean birth for most people after 1 or 2 cesarean births.  - The success rate for VBAC (vaginal birth after cesarean) is approximately 76% after 1 cesarean and 71%-82% after 2 cesareans. - The risk of uterine rupture is approximately 0.47%-0.7% after 1 cesarean and 1.36% after 2 cesareans. - Generally similar outcomes for baby  Contraindications: - Previous classical incision (more likely if your cesarean birth was preterm) - Previous other surgeries on your uterus  - Conditions in which vaginal birth is otherwise not indicated  Success: You can increase your chance of success by going into labor naturally (not being induced). The best way is by helping to make sure your cervix is "ripe" or ready for labor.  - Eating 6-8 dates per day starting as early as 34 weeks can help soften and even dilate your cervix. You may eat them plain, in smoothies, or in energy balls.  They do contain sugar so eat use good judgment if you have diabetes and balance with protein.  - Sex can also help to ripen the cervix.  - The [employer] is a series of exercises to help get your baby into a good position.  - Cleaning the house on hands and knees - another good method to get baby into a good position - We can consider cervical sweeps to help ripen the cervix - 38-39 weeks.   More information:  Evidence Based Birth TOLAC Podcast and Article: https://www.choi-stevens.org/ [employer] LAC Decision Tool: https://www.ontariomidwives.ca/sites/default/files/2021-06/Deciding-how-to-give-birth-after-caesarean-section-English.pdf

## 2024-03-17 NOTE — Progress Notes (Unsigned)
 I connected with Amy Downs 03/18/24 at  9:15 AM EST by: MyChart video and verified that I am speaking with the correct person using two identifiers.  Patient is located at home and provider is located at [facility].     I discussed the limitations, risks, security and privacy concerns of performing an evaluation and management service by MyChart video and the availability of in person appointments. I also discussed with the patient that there may be a patient responsible charge related to this service. By engaging in this virtual visit, you consent to the provision of healthcare.  Additionally, you authorize for your insurance to be billed for the services provided during this visit.  The patient expressed understanding and agreed to proceed.  The following staff members participated in the virtual visit:  Camie Rote    PRENATAL VISIT NOTE  Subjective:  Amy Downs is a 29 y.o. [id number] at [redacted]w[redacted]d  for virtual visit for ongoing prenatal care.  She is currently monitored for the following issues for this high-risk pregnancy and has History of C-section; History of severe pre-eclampsia; History of hyperemesis gravidarum; Hyperemesis gravidarum; Supervision of high risk pregnancy, antepartum; Dehydration; and History of VBAC on their problem list.  Patient reports no complaints.  Contractions: Not present. Vag. Bleeding: None.  Movement: Present. Denies leaking of fluid.   The following portions of the patient's history were reviewed and updated as appropriate: allergies, current medications, past family history, past medical history, past social history, past surgical history and problem list.   Objective:  There were no vitals filed for this visit. Self-Obtained  Fetal Status:     Movement: Present     Assessment and Plan:  Pregnancy: G3P1103 at [redacted]w[redacted]d 1. Supervision of high risk pregnancy, antepartum (Primary) - Doing well, feeling regular and vigorous fetal  movement     2. [redacted] weeks gestation of pregnancy - Routine PNC, anticipatory guidance.    3. History of severe pre-eclampsia - Patient has home cuff, will send to babyscripts today.   4. History of C-section 5. History of VBAC - Desires repeat TOLAC, needs consent.   6. Chronic hypertension affecting pregnancy - Patient has home cuff, will send values later.   Preterm labor symptoms and general obstetric precautions including but not limited to vaginal bleeding, contractions, leaking of fluid and fetal movement were reviewed in detail with the patient.  Return in about 3 weeks (around 04/08/2024) for HROB with GTT.  Future Appointments  Date Time Provider Department Center  03/19/2024  9:15 AM [facility] PROVIDER 1 [facility]  03/19/2024  9:30 AM [facility] US5 [facility]  04/09/2024  8:15 AM Ilean Norleen GAILS, MD [facility]  04/09/2024  8:40 AM [facility] LAB [facility]  04/23/2024  8:55 AM Zina Jerilynn LABOR, MD [facility]  05/07/2024 10:55 AM Zina Jerilynn LABOR, MD [facility]  05/21/2024 10:55 AM [facility]GENERAL 1 [facility]  06/04/2024 10:55 AM [facility]GENERAL 1 [facility]  06/11/2024 10:55 AM [facility]-GENERAL 1 [facility]  06/18/2024 10:55 AM [facility]-GENERAL 1 [facility]  06/25/2024 10:55 AM [facility]-GENERAL 1 [facility]  07/02/2024 10:55 AM [facility]-GENERAL 1 [facility]     Time spent on virtual visit: 10 minutes  Camie LABOR Rote, CNM

## 2024-03-18 ENCOUNTER — Telehealth: Admitting: Certified Nurse Midwife

## 2024-03-18 DIAGNOSIS — O10919 Unspecified pre-existing hypertension complicating pregnancy, unspecified trimester: Secondary | ICD-10-CM

## 2024-03-18 DIAGNOSIS — O0990 Supervision of high risk pregnancy, unspecified, unspecified trimester: Secondary | ICD-10-CM

## 2024-03-18 DIAGNOSIS — Z3A25 25 weeks gestation of pregnancy: Secondary | ICD-10-CM

## 2024-03-18 DIAGNOSIS — Z98891 History of uterine scar from previous surgery: Secondary | ICD-10-CM

## 2024-03-18 DIAGNOSIS — Z8759 Personal history of other complications of pregnancy, childbirth and the puerperium: Secondary | ICD-10-CM

## 2024-03-19 ENCOUNTER — Ambulatory Visit: Attending: Obstetrics and Gynecology | Admitting: Obstetrics and Gynecology

## 2024-03-19 ENCOUNTER — Ambulatory Visit (HOSPITAL_BASED_OUTPATIENT_CLINIC_OR_DEPARTMENT_OTHER)

## 2024-03-19 VITALS — BP 117/68 | HR 97

## 2024-03-19 DIAGNOSIS — O34219 Maternal care for unspecified type scar from previous cesarean delivery: Secondary | ICD-10-CM | POA: Insufficient documentation

## 2024-03-19 DIAGNOSIS — O321XX0 Maternal care for breech presentation, not applicable or unspecified: Secondary | ICD-10-CM | POA: Diagnosis not present

## 2024-03-19 DIAGNOSIS — Z8759 Personal history of other complications of pregnancy, childbirth and the puerperium: Secondary | ICD-10-CM | POA: Diagnosis not present

## 2024-03-19 DIAGNOSIS — O0990 Supervision of high risk pregnancy, unspecified, unspecified trimester: Secondary | ICD-10-CM

## 2024-03-19 DIAGNOSIS — Z362 Encounter for other antenatal screening follow-up: Secondary | ICD-10-CM | POA: Diagnosis not present

## 2024-03-19 DIAGNOSIS — E669 Obesity, unspecified: Secondary | ICD-10-CM

## 2024-03-19 DIAGNOSIS — Z3A25 25 weeks gestation of pregnancy: Secondary | ICD-10-CM | POA: Insufficient documentation

## 2024-03-19 DIAGNOSIS — Z364 Encounter for antenatal screening for fetal growth retardation: Secondary | ICD-10-CM | POA: Diagnosis not present

## 2024-03-19 DIAGNOSIS — O09292 Supervision of pregnancy with other poor reproductive or obstetric history, second trimester: Secondary | ICD-10-CM | POA: Diagnosis not present

## 2024-03-19 DIAGNOSIS — O99212 Obesity complicating pregnancy, second trimester: Secondary | ICD-10-CM | POA: Diagnosis present

## 2024-03-19 DIAGNOSIS — O210 Mild hyperemesis gravidarum: Secondary | ICD-10-CM

## 2024-03-19 NOTE — Progress Notes (Signed)
 After review, MFM consult with provider is not indicated for today  Arna Ranks, MD 03/19/2024 11:02 AM  [facility]

## 2024-04-09 ENCOUNTER — Other Ambulatory Visit: Payer: Self-pay

## 2024-04-09 ENCOUNTER — Encounter: Payer: Self-pay | Admitting: Family Medicine

## 2024-05-07 ENCOUNTER — Ambulatory Visit

## 2024-05-07 ENCOUNTER — Encounter: Payer: Self-pay | Admitting: Obstetrics and Gynecology

## 2024-05-21 ENCOUNTER — Encounter: Payer: Self-pay | Admitting: Obstetrics and Gynecology
